# Patient Record
Sex: Female | Born: 1995
Health system: Southern US, Community
[De-identification: ages and names within clinical notes are randomized; demographics above are authoritative.]

## PROBLEM LIST (undated history)

## (undated) DIAGNOSIS — T4145XA Adverse effect of unspecified anesthetic, initial encounter: Secondary | ICD-10-CM

## (undated) DIAGNOSIS — E282 Polycystic ovarian syndrome: Secondary | ICD-10-CM

## (undated) DIAGNOSIS — N912 Amenorrhea, unspecified: Secondary | ICD-10-CM

## (undated) DIAGNOSIS — E119 Type 2 diabetes mellitus without complications: Secondary | ICD-10-CM

## (undated) DIAGNOSIS — N946 Dysmenorrhea, unspecified: Secondary | ICD-10-CM

## (undated) DIAGNOSIS — N979 Female infertility, unspecified: Secondary | ICD-10-CM

## (undated) DIAGNOSIS — H539 Unspecified visual disturbance: Secondary | ICD-10-CM

## (undated) DIAGNOSIS — T8859XA Other complications of anesthesia, initial encounter: Secondary | ICD-10-CM

## (undated) DIAGNOSIS — C801 Malignant (primary) neoplasm, unspecified: Secondary | ICD-10-CM

## (undated) DIAGNOSIS — E349 Endocrine disorder, unspecified: Secondary | ICD-10-CM

## (undated) DIAGNOSIS — J45909 Unspecified asthma, uncomplicated: Secondary | ICD-10-CM

## (undated) DIAGNOSIS — R7303 Prediabetes: Secondary | ICD-10-CM

## (undated) HISTORY — DX: Female infertility, unspecified: N97.9

## (undated) HISTORY — DX: Malignant (primary) neoplasm, unspecified: C80.1

## (undated) HISTORY — PX: ADENOIDECTOMY: SUR15

## (undated) HISTORY — PX: APPENDECTOMY: SHX54

## (undated) HISTORY — PX: CARPAL TUNNEL RELEASE: SHX101

## (undated) HISTORY — PX: CATARACT EXTRACTION, BILATERAL: SHX1313

## (undated) HISTORY — DX: Polycystic ovarian syndrome: E28.2

## (undated) HISTORY — DX: Amenorrhea, unspecified: N91.2

## (undated) HISTORY — PX: DILATION AND CURETTAGE OF UTERUS: SHX78

## (undated) HISTORY — DX: Endocrine disorder, unspecified: E34.9

## (undated) HISTORY — DX: Prediabetes: R73.03

## (undated) HISTORY — PX: TONSILLECTOMY: SUR1361

## (undated) HISTORY — DX: Dysmenorrhea, unspecified: N94.6

---
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
## 2014-01-20 ENCOUNTER — Encounter (HOSPITAL_BASED_OUTPATIENT_CLINIC_OR_DEPARTMENT_OTHER): Payer: Self-pay | Admitting: Emergency Medicine

## 2014-01-20 ENCOUNTER — Emergency Department (HOSPITAL_BASED_OUTPATIENT_CLINIC_OR_DEPARTMENT_OTHER)
Admission: EM | Admit: 2014-01-20 | Discharge: 2014-01-20 | Disposition: A | Discharge status: Home / Self Care | Payer: Medicaid Other | Attending: Emergency Medicine | Admitting: Emergency Medicine

## 2014-01-20 DIAGNOSIS — Z880 Allergy status to penicillin: Secondary | ICD-10-CM | POA: Insufficient documentation

## 2014-01-20 DIAGNOSIS — H6092 Unspecified otitis externa, left ear: Secondary | ICD-10-CM

## 2014-01-20 DIAGNOSIS — H9209 Otalgia, unspecified ear: Secondary | ICD-10-CM | POA: Insufficient documentation

## 2014-01-20 DIAGNOSIS — Z8669 Personal history of other diseases of the nervous system and sense organs: Secondary | ICD-10-CM | POA: Insufficient documentation

## 2014-01-20 DIAGNOSIS — H60509 Unspecified acute noninfective otitis externa, unspecified ear: Secondary | ICD-10-CM | POA: Insufficient documentation

## 2014-01-20 DIAGNOSIS — Z79899 Other long term (current) drug therapy: Secondary | ICD-10-CM | POA: Insufficient documentation

## 2014-01-20 MED ORDER — OFLOXACIN 0.3 % OT SOLN: 5.0000 [drp] | Freq: Two times a day (BID) | OTIC | Status: DC

## 2014-01-20 MED ORDER — ANTIPYRINE-BENZOCAINE 5.4-1.4 % OT SOLN: 3.0000 [drp] | OTIC | Status: DC | PRN

## 2014-01-20 MED ORDER — CIPROFLOXACIN-DEXAMETHASONE 0.3-0.1 % OT SUSP: 4.0000 [drp] | Freq: Two times a day (BID) | OTIC | Status: DC

## 2014-01-20 NOTE — Discharge Instructions (Signed)
Use antibiotic ear drops as directed along with auralgan for pain. Do not use Q-tips in your ear.  Otitis externa (Otitis Externa) La otitis externa es una infeccin bacteriana o por hongos en el conducto auditivo externo. Esta es el rea desde el tmpano hasta el exterior de la [place]. Tambin se llama "odo de nadador". CAUSAS  Las posibles causas de la infeccin son:  Tonye Becket en agua sucia.  Humedad que queda en el odo despus de nadar o baarse.  Lesin leve en el odo (traumatismo).  Objetos atascados en el odo (cuerpo extrao).  Cortes o raspones (abrasiones) en la parte exterior del odo. SIGNOS Y SNTOMAS  En general, el primer sntoma de infeccin es la picazn en el canal auditivo. Ms tarde, los signos y los sntomas pueden ser hinchazn y enrojecimiento del conducto auditivo, dolor de odo y supuracin de lquido de color blanco amarillento (pus). El Social research officer, government de odo puede empeorar cuando tira el lbulo de la [place]. DIAGNSTICO  Su mdico le Chartered certified accountant examen fsico. Podr tomar Truddie Coco de lquido de la oreja y Hydrographic surveyor bacterias u hongos. TRATAMIENTO  Las gotas antibiticas para los odos se administran generalmente durante 10 a 14 das. El tratamiento tambin puede incluir analgsicos o corticoides para reducir la comezn y la hinchazn. INSTRUCCIONES PARA EL CUIDADO EN EL HOGAR   Aplique gotas de antibitico en el conducto auditivo segn lo indicado por el mdico.  Tome los medicamentos solamente como se lo haya indicado el mdico.  Si tiene diabetes, siga las instrucciones adicionales de tratamiento que le haya dado el mdico.  Consulting civil engineer a todas las visitas de control como se lo haya indicado el mdico. PREVENCIN   Mantenga el odo seco. Use la punta de una toalla para absorber el agua del canal auditivo despus de nadar o del bao.  Evite rascarse o poner objetos en el interior del odo. Esto puede daar el conducto auditivo o eliminar la cera protectora que  recubre el conducto. Esto facilita el crecimiento de las bacterias y hongos.  Evite [employer], en agua contaminada, o en las piscinas mal cloradas.  Puede usar gotas para los odos hechas de alcohol y vinagre despus de nadar. Mezcle en partes iguales el vinagre blanco y el alcohol en una botella. Ponga 3 o 4 gotas en cada odo despus de nadar. SOLICITE ATENCIN MDICA SI:   Jaclynn Guarneri.  Su odo contina rojo, hinchado, le duele o supura pus despus de 3 das.  El dolor, la hinchazn o el enrojecimiento empeoran.  Sufre un dolor intenso de [place].  Tiene en la zona detrs de la oreja roja, hinchada, le duele o est sensible. ASEGRESE DE QUE:   Comprende estas instrucciones.  Controlar su afeccin.  Recibir ayuda de inmediato si no mejora o si empeora. Document Released: 05/25/2005 Document Revised: 10/09/2013 [facility] Patient Information 2015 [place], [place]. This information is not intended to replace advice given to you by your health care provider. Make sure you discuss any questions you have with your health care provider.

## 2014-01-20 NOTE — ED Provider Notes (Signed)
CSN: [id number]     Arrival date & time 01/20/14  1922 History   First MD Initiated Contact with Patient 01/20/14 1932     Chief Complaint  Patient presents with  . Otalgia     (Consider location/radiation/quality/duration/timing/severity/associated sxs/prior Treatment) HPI Comments: Patient is a 18 year old female who presents to the emergency department complaining of left ear pain x2 weeks. Patient reports her ear has been aching and feels clogged. She tried using over-the-counter ear drop which temporarily relieved her symptoms for a few days up until 3 days ago. Pain has worsened. She tried cleaning her ears with Q-tips and states the remains. States she has slightly decreased hearing in that ear. Denies fever or chills. No ear drainage.  Patient is a 18 y.o. female presenting with ear pain. The history is provided by the patient.  Otalgia Associated symptoms: no cough and no fever     History reviewed. No pertinent past medical history. Past Surgical History  Procedure Laterality Date  . Tonsillectomy    . Appendectomy    . Carpal tunnel release    . Cataract extraction, bilateral     History reviewed. No pertinent family history. History  Substance Use Topics  . Smoking status: Never Smoker   . Smokeless tobacco: Not on file  . Alcohol Use: No   OB History   Grav Para Term Preterm Abortions TAB SAB Ect Mult Living                 Review of Systems  Constitutional: Negative for fever.  HENT: Positive for ear pain.   Respiratory: Negative for cough.   Gastrointestinal: Negative.   Skin: Negative.       Allergies  Amoxicillin and Penicillins  Home Medications   Prior to Admission medications   Medication Sig Start Date End Date Taking? Authorizing Provider  antipyrine-benzocaine Toniann Fail) otic solution Place 3-4 drops into the left ear every 2 (two) hours as needed for ear pain. 01/20/14   Illene Labrador, PA-C  cetirizine (ZYRTEC) 10 MG tablet Take 10 mg by  mouth daily.    Historical Provider, MD  ciprofloxacin-dexamethasone (CIPRODEX) otic suspension Place 4 drops into the left ear 2 (two) times daily. x5 days 01/20/14   Illene Labrador, PA-C   BP 116/61  Pulse 77  Temp(Src) 97.9 F (36.6 C) (Oral)  Resp 18  Ht 5\' 3"  (1.6 m)  Wt 210 lb (95.255 kg)  BMI 37.21 kg/m2  SpO2 97%  LMP 11/06/2013 Physical Exam  Nursing note and vitals reviewed. Constitutional: She is oriented to person, place, and time. She appears well-developed and well-nourished. No distress.  HENT:  Head: Normocephalic and atraumatic.  Mouth/Throat: Oropharynx is clear and moist.  L ear canal inflamed and erythematous. L TM normal. R ear normal. No drainage from BL ears.  Eyes: Conjunctivae are normal.  Neck: Normal range of motion. Neck supple.  Cardiovascular: Normal rate, regular rhythm and normal heart sounds.   Pulmonary/Chest: Effort normal and breath sounds normal.  Musculoskeletal: Normal range of motion. She exhibits no edema.  Neurological: She is alert and oriented to person, place, and time.  Skin: Skin is warm and dry. She is not diaphoretic.  Psychiatric: She has a normal mood and affect. Her behavior is normal.    ED Course  Procedures (including critical care time) Labs Review Labs Reviewed - No data to display  Imaging Review No results found.   EKG Interpretation None      MDM  Final diagnoses:  Left otitis externa   Treat with ciprodex and auralgan. Stable for d/c. Return precautions given. Patient states understanding of treatment care plan and is agreeable.  Illene Labrador, PA-C 01/20/14 1943

## 2014-01-20 NOTE — ED Provider Notes (Signed)
History/physical exam/procedure(s) were performed by non-physician practitioner and as supervising physician I was immediately available for consultation/collaboration. I have reviewed all notes and am in agreement with care and plan.   Shaune Pollack, MD 01/20/14 [phone]

## 2014-01-20 NOTE — ED Notes (Signed)
Painful earache for two weeks. Can't hear out of left ear.

## 2014-05-28 ENCOUNTER — Encounter (HOSPITAL_COMMUNITY): Payer: Self-pay | Admitting: Emergency Medicine

## 2014-05-28 ENCOUNTER — Emergency Department (HOSPITAL_COMMUNITY)
Admission: EM | Admit: 2014-05-28 | Discharge: 2014-05-28 | Disposition: A | Discharge status: Home / Self Care | Payer: Medicaid Other | Attending: Emergency Medicine | Admitting: Emergency Medicine

## 2014-05-28 DIAGNOSIS — M62838 Other muscle spasm: Secondary | ICD-10-CM

## 2014-05-28 DIAGNOSIS — Z880 Allergy status to penicillin: Secondary | ICD-10-CM | POA: Insufficient documentation

## 2014-05-28 DIAGNOSIS — Z792 Long term (current) use of antibiotics: Secondary | ICD-10-CM | POA: Insufficient documentation

## 2014-05-28 DIAGNOSIS — Z79899 Other long term (current) drug therapy: Secondary | ICD-10-CM | POA: Insufficient documentation

## 2014-05-28 MED ORDER — METHOCARBAMOL 500 MG PO TABS
500.0000 mg | ORAL_TABLET | Freq: Once | ORAL | Status: AC
  Administered 2014-05-28: 500 mg via ORAL
  Filled 2014-05-28: qty 1

## 2014-05-28 MED ORDER — HYDROCODONE-ACETAMINOPHEN 5-325 MG PO TABS
2.0000 | ORAL_TABLET | Freq: Once | ORAL | Status: AC
  Administered 2014-05-28: 2 via ORAL
  Filled 2014-05-28: qty 2

## 2014-05-28 MED ORDER — IBUPROFEN 600 MG PO TABS: 600.0000 mg | ORAL_TABLET | Freq: Four times a day (QID) | ORAL | Status: DC | PRN

## 2014-05-28 MED ORDER — HYDROCODONE-ACETAMINOPHEN 5-325 MG PO TABS: 1.0000 | ORAL_TABLET | Freq: Four times a day (QID) | ORAL | Status: DC | PRN

## 2014-05-28 MED ORDER — IBUPROFEN 400 MG PO TABS
600.0000 mg | ORAL_TABLET | Freq: Once | ORAL | Status: AC
  Administered 2014-05-28: 600 mg via ORAL
  Filled 2014-05-28 (×2): qty 1

## 2014-05-28 MED ORDER — METHOCARBAMOL 500 MG PO TABS: 500.0000 mg | ORAL_TABLET | Freq: Two times a day (BID) | ORAL | Status: DC

## 2014-05-28 NOTE — Discharge Instructions (Signed)
Calambres y espasmos musculares  (Muscle Cramps and Spasms)  Los calambres musculares y espasmos ocurren cuando un msculo o grupos de msculos se tensan y no se tiene control sobre esta tensin (contraccin muscular involuntaria). Es un problema comn y Audiological scientist en cualquier msculo. La zona ms comn son los msculos de la pantorrilla. Tanto los [employer] espasmos son contracciones musculares involuntarias, pero tambin tienen diferencias:   Los calambres musculares son espordicos y Soil scientist. Pueden durar entre algunos segundos hasta un cuarto de [place]. Los calambres musculares son ms fuertes y duran ms que los espasmos musculares.  Los espasmos pueden o no ser dolorosos. Pueden durar algunos segundos o mucho ms. CAUSAS  No es frecuente que los calambres se deban a un trastorno subyacente grave. En muchos casos, la causa de los calambres y los espasmos es desconocida. Algunas causas frecuentes son:   Esfuerzo excesivo.   El uso excesivo del msculo por movimientos repetitivos (hacer lo mismo una y Elmon Kirschner).   Permanecer en cierta posicin durante un largo perodo de [place].   Preparacin, forma o tcnica inadecuada al realizar un deporte o [place].   Deshidratacin.   Traumatismos.   Efectos secundarios de algunos medicamentos.  Niveles anormalmente bajos de las sales e iones en la sangre (electrolitos), especialmente el potasio y el calcio. Pueden ocurrir cuando se toman pldoras para Garment/textile technologist (diurticos) o en las mujeres embarazadas.  Algunos problemas mdicos subyacentes pueden hacer que sea ms propenso a desarrollar calambres o espasmos. Estos incluyen, pero no se limitan a:   Diabetes.   Enfermedad de Parkinson.   Trastornos hormonales, tales como problemas de la tiroides.   El consumo excesivo de alcohol.   Enfermedades especficas de [employer], las articulaciones y [place].   Enfermedad vascular en la que no llega suficiente sangre  a los msculos.  INSTRUCCIONES PARA EL CUIDADO EN EL HOGAR   Mantngase bien hidratado. Beba gran cantidad de lquido para mantener la orina de tono claro o color amarillo plido.  Puede ser til Community education officer, Neurosurgeon y Media planner el msculo afectado.  Para los msculos tensos o apretados, use una toalla caliente, una almohadilla trmica o agua caliente de la ducha dirigida a la zona afectada.  Si est dolorido o siente dolor despus de un calambre o espasmo, aplique hielo en el rea afectada para Runner, broadcasting/film/video.  Ponga el hielo en una bolsa plstica.  Colquese una toalla entre la piel y la bolsa de hielo.  Deje el hielo en el lugar durante 15 a 20 minutos, 3 a 4 veces por da.  Los medicamentos que se utilizan para tratar las causas conocidas de los calambres o espasmos pueden reducir su frecuencia o gravedad. Tome slo medicamentos de venta libre o recetados, segn las indicaciones del mdico. SOLICITE ATENCIN MDICA SI:  Los calambres o espasmos empeoran, ocurren con ms frecuencia o no mejoran con Physiological scientist.  ASEGRESE DE QUE:   Comprende estas instrucciones.  Controlar su enfermedad.  Solicitar ayuda de inmediato si no mejora o si empeora. Document Released: 03/04/2005 Document Revised: 09/19/2012 [facility] Patient Information 2015 [place], [place]. This information is not intended to replace advice given to you by your health care provider. Make sure you discuss any questions you have with your health care provider.  Terapia con calor (Heat Therapy) La terapia con calor puede ayudar a aliviar articulaciones y msculos doloridos, lesionados, tensos y rgidos. El calor [employer], lo cual puede ayudar a Best boy.  RIESGOS Y  COMPLICACIONES Si tiene cualquiera de los [employer], no utilice la terapia con calor a menos que su mdico lo haya autorizado:  Mala circulacin.  Heridas que se estn curando o piel con cicatrices en la zona a  tratar.  Diabetes, enfermedades cardacas o hipertensin arterial.  Incapacidad de sentir (entumecimiento) la zona tratada.  Hinchazn inusual de la zona a tratar.  Infecciones activas.  Cogulos sanguneos.  Cncer.  Incapacidad de Scientist, forensic. Esto puede incluir nios pequeos y personas que tienen problemas con la funcin cerebral (demencia).  Embarazo. La terapia con calor solo se debe usar en lesiones viejas, preexistentes o de larga duracin (crnicas). No utilice la terapia con calor en lesiones nuevas a menos que el mdico se lo indique. CMO USAR LA TERAPIA CON CALOR Existen varios tipos distintos de terapia con calor, como:  Compresas hmedas calientes.  Bao de agua caliente.  Bolsa de agua caliente.  Almohadilla trmica.  Bolsa de gel caliente.  Vendaje caliente.  Almohadilla trmica. Utilice el mtodo de terapia con calor que le sugiera su mdico. Siga las indicaciones del mdico sobre cmo y cundo usar la terapia con Freight forwarder. RECOMENDACIONES GENERALES PARA LA TERAPIA CON CALOR  No duerma mientras [place] la terapia con calor. Utilice la terapia con calor solo mientras est despierto.  La piel puede volverse rosada mientras [place] la terapia con calor. No use la terapia con calor si la piel se pone roja.  No use la terapia con calor si siente un dolor nuevo.  Una temperatura muy alta o una exposicin prolongada al calor puede causar quemaduras. Sea cauto con la terapia de calor para evitar quemar la piel.  No use la terapia con calor en zonas de la piel que ya estn irritadas, como con una erupcin o una quemadura de sol. SOLICITE ATENCIN MDICA SI:  Observa ampollas, enrojecimiento, hinchazn o adormecimiento.  Siente un dolor nuevo.  El dolor [place]. ASEGRESE DE QUE:  Comprende estas instrucciones.  Controlar su afeccin.  Recibir ayuda de inmediato si no mejora o si empeora. Document Released: 08/17/2011 Document Revised:  10/09/2013 [facility] Patient Information 2015 [place]. This information is not intended to replace advice given to you by your health care provider. Make sure you discuss any questions you have with your health care provider.

## 2014-05-28 NOTE — ED Provider Notes (Signed)
CSN: [id number]     Arrival date & time 05/28/14  0321 History  This chart was scribed for Varney Biles, MD by Rayfield Citizen, ED Scribe. This patient was seen in room D30C/D30C and the patient's care was started at 3:44 AM.    Chief Complaint  Patient presents with  . Neck Pain   Patient is a 18 y.o. female presenting with neck pain. The history is provided by the patient. No language interpreter was used.  Neck Pain Associated symptoms: no numbness and no weakness      HPI Comments: Melissa Riggs is an otherwise healthy 18 y.o. female who presents to the Emergency Department complaining of 1 day of worsening left-sided neck pain. She reports that she woke up with this pain; she denies recent trauma or injury. Her pain is worse with movement/twisting, turning to the left worse than turning to the right. She attempted to treat her pain with icy hot patches and rubbing alcohol; there was no improvement. She denies any numbness, weakness, paresthesia or pain in her arms at this time.   History reviewed. No pertinent past medical history. Past Surgical History  Procedure Laterality Date  . Tonsillectomy    . Appendectomy    . Carpal tunnel release    . Cataract extraction, bilateral     History reviewed. No pertinent family history. History  Substance Use Topics  . Smoking status: Never Smoker   . Smokeless tobacco: Not on file  . Alcohol Use: No   OB History    No data available     Review of Systems  Constitutional: Positive for activity change.  Musculoskeletal: Positive for myalgias and neck pain.  Neurological: Negative for weakness and numbness.   Allergies  Amoxicillin and Penicillins  Home Medications   Prior to Admission medications   Medication Sig Start Date End Date Taking? Authorizing Provider  antipyrine-benzocaine Toniann Fail) otic solution Place 3-4 drops into the left ear every 2 (two) hours as needed for ear pain. 01/20/14   Carman Ching, PA-C  cetirizine  (ZYRTEC) 10 MG tablet Take 10 mg by mouth daily.    Historical Provider, MD  ciprofloxacin-dexamethasone (CIPRODEX) otic suspension Place 4 drops into the left ear 2 (two) times daily. x5 days 01/20/14   Carman Ching, PA-C  HYDROcodone-acetaminophen (NORCO/VICODIN) 5-325 MG per tablet Take 1 tablet by mouth every 6 (six) hours as needed. 05/28/14   Varney Biles, MD  ibuprofen (ADVIL,MOTRIN) 600 MG tablet Take 1 tablet (600 mg total) by mouth every 6 (six) hours as needed. 05/28/14   Varney Biles, MD  methocarbamol (ROBAXIN) 500 MG tablet Take 1 tablet (500 mg total) by mouth 2 (two) times daily. 05/28/14   Varney Biles, MD  ofloxacin (FLOXIN) 0.3 % otic solution Place 5 drops into the left ear 2 (two) times daily. x5 days 01/20/14   Hessie Diener Hess, PA-C   BP 140/89 mmHg  Pulse 82  Temp(Src) 97.8 F (36.6 C) (Oral)  Resp 16  Ht 5\' 4"  (1.626 m)  Wt 215 lb (97.523 kg)  BMI 36.89 kg/m2  SpO2 97% Physical Exam  Constitutional: She is oriented to person, place, and time. She appears well-developed and well-nourished.  HENT:  Head: Normocephalic and atraumatic.  Mouth/Throat: Oropharynx is clear and moist. No oropharyngeal exudate.  Eyes: EOM are normal. Pupils are equal, round, and reactive to light.  Cardiovascular: Normal rate, regular rhythm and normal heart sounds.  Exam reveals no gallop and no friction rub.  No murmur heard. Pulmonary/Chest: Effort normal and breath sounds normal. No respiratory distress. She has no wheezes. She has no rales.  Abdominal: Soft. There is no tenderness. There is no rebound and no guarding.  Musculoskeletal: Normal range of motion. She exhibits no edema.  Left cervical paraspinal tenderness extending to scapular region  Tenderness reproduced with palpation and lateral flexion of the neck; worse on the left side  Neurological: She is alert and oriented to person, place, and time.  Skin: Skin is warm and dry. No rash noted.  Psychiatric: She has a normal  mood and affect. Her behavior is normal.  Nursing note and vitals reviewed.   ED Course  Procedures   DIAGNOSTIC STUDIES: Oxygen Saturation is 97% on RA, adequate by my interpretation.    COORDINATION OF CARE: 3:48 AM Discussed treatment plan with pt at bedside and pt agreed to plan.   Labs Review Labs Reviewed - No data to display  Imaging Review No results found.   EKG Interpretation None      MDM   Final diagnoses:  Cervical paraspinal muscle spasm   I personally performed the services described in this documentation, which was scribed in my presence. The recorded information has been reviewed and is accurate.  Pt comes in with cc of L neck pain. Pt's pain is paraspinal, reproducible with palpation and with movement of the neck. She has no fevers, headache, trauma hx or neuro complains (vision/balance, numbness). Will tx as cervical sprain.     Varney Biles, MD 05/28/14 [phone]

## 2014-05-28 NOTE — ED Notes (Signed)
Patient with left neck pain.  Patient states that it is the left side of her neck.  Patient denies any headache with the pain.  Patient states she has more pain when turning her head to the left than right.  Patient denies any injury to the area.

## 2014-09-20 ENCOUNTER — Emergency Department (HOSPITAL_BASED_OUTPATIENT_CLINIC_OR_DEPARTMENT_OTHER)
Admission: EM | Admit: 2014-09-20 | Discharge: 2014-09-20 | Disposition: A | Discharge status: Home / Self Care | Payer: Medicaid Other | Attending: Emergency Medicine | Admitting: Emergency Medicine

## 2014-09-20 ENCOUNTER — Encounter (HOSPITAL_BASED_OUTPATIENT_CLINIC_OR_DEPARTMENT_OTHER): Payer: Self-pay

## 2014-09-20 DIAGNOSIS — L03116 Cellulitis of left lower limb: Secondary | ICD-10-CM | POA: Insufficient documentation

## 2014-09-20 DIAGNOSIS — Z880 Allergy status to penicillin: Secondary | ICD-10-CM | POA: Insufficient documentation

## 2014-09-20 DIAGNOSIS — Z79899 Other long term (current) drug therapy: Secondary | ICD-10-CM | POA: Insufficient documentation

## 2014-09-20 MED ORDER — CLINDAMYCIN HCL 150 MG PO CAPS
450.0000 mg | ORAL_CAPSULE | Freq: Four times a day (QID) | ORAL | Status: DC
Start: 2014-09-20 — End: 2015-06-02

## 2014-09-20 MED ORDER — KETOROLAC TROMETHAMINE 60 MG/2ML IM SOLN
60.0000 mg | Freq: Once | INTRAMUSCULAR | Status: AC
  Administered 2014-09-20: 60 mg via INTRAMUSCULAR
  Filled 2014-09-20: qty 2

## 2014-09-20 NOTE — Discharge Instructions (Signed)
Return to the emergency room with worsening of symptoms, new symptoms or with symptoms that are concerning, especially fevers, worsening redness, swelling, red streaks. Please take all of your antibiotics until finished!   You may develop abdominal discomfort or diarrhea from the antibiotic.  You may help offset this with probiotics which you can buy or get in yogurt. Do not eat  or take the probiotics until 2 hours after your antibiotic.  Follow up here/PCP/ urgent care in 2-3 days for reevauluation Read below information and follow recommendations.  Celulitis (Cellulitis) La celulitis es una infeccin de la piel y del tejido subyacente. El rea infectada generalmente est de color rojo y duele. Ocurre con ms frecuencia en los brazos y en las piernas.  CAUSAS  La causa es una bacteria que ingresa en la piel a travs de grietas o cortes. Los tipos ms frecuentes de bacterias que causan celulitis son los estafilococos y los estreptococos. SIGNOS Y SNTOMAS   Enrojecimiento y calor.  Hinchazn.  Sensibilidad o dolor.  Cristy Hilts. DIAGNSTICO  Por lo general, el mdico puede diagnosticar esta afeccin con un examen fsico. Es posible que le indiquen anlisis de [place]. TRATAMIENTO  El tratamiento consiste en tomar antibiticos. Vazquez los antibiticos como le indic el mdico. Termine los antibiticos aunque comience a sentirse mejor.  Mantenga el brazo o la pierna elevados para reducir la hinchazn.  Aplique paos calientes en la zona hasta 4 veces al da para Glass blower/designer.  Tome los medicamentos solamente como se lo haya indicado el mdico.  Concurra a todas las visitas de control como se lo haya indicado el mdico. SOLICITE ATENCIN MDICA SI:   Margette Fast lnea roja en la piel que sale desde la zona infectada.  El rea roja se extiende o se vuelve de color oscuro.  El hueso o la articulacin que se encuentran por debajo de la zona  infectada le duelen despus de que la piel se [place].  La infeccin se repite en la misma zona o en una zona diferente.  Tiene un bulto inflamado en la zona infectada.  Presenta nuevos sntomas.  Tiene fiebre. SOLICITE ATENCIN MDICA DE INMEDIATO SI:   Se siente muy somnoliento.  Tiene vmitos o diarrea.  Se siente enfermo (malestar general) con dolores musculares. ASEGRESE DE QUE:   Comprende estas instrucciones.  Controlar su afeccin.  Recibir ayuda de inmediato si no mejora o si empeora. Document Released: 03/04/2005 Document Revised: 10/09/2013 [facility] Patient Information 2015 [place]. This information is not intended to replace advice given to you by your health care provider. Make sure you discuss any questions you have with your health care provider.

## 2014-09-20 NOTE — ED Notes (Signed)
Reports increased swelling, redness and pain to left leg

## 2014-09-20 NOTE — ED Provider Notes (Signed)
CSN: [id number]     Arrival date & time 09/20/14  1840 History   First MD Initiated Contact with Patient 09/20/14 1850     Chief Complaint  Patient presents with  . Leg Pain     (Consider location/radiation/quality/duration/timing/severity/associated sxs/prior Treatment) HPI  Melissa Riggs is a 19 y.o. female presenting with one week of erythema, swelling, pain to left medial leg proximal to knee. Patient reports it has been spreading. She denies any discharge or drainage. No fevers or chills, nausea, vomiting. She denies history of diabetes, HIV or chronic illness. Patient states the pain is worse with straightening her leg. She has been taking ibuprofen with some improvement of her symptoms.   History reviewed. No pertinent past medical history. Past Surgical History  Procedure Laterality Date  . Tonsillectomy    . Appendectomy    . Carpal tunnel release    . Cataract extraction, bilateral     No family history on file. History  Substance Use Topics  . Smoking status: Never Smoker   . Smokeless tobacco: Not on file  . Alcohol Use: No   OB History    No data available     Review of Systems  Constitutional: Negative for fever and chills.  Gastrointestinal: Negative for nausea and vomiting.  Skin: Positive for color change. Negative for wound.      Allergies  Amoxicillin and Penicillins  Home Medications   Prior to Admission medications   Medication Sig Start Date End Date Taking? Authorizing Provider  antipyrine-benzocaine Toniann Fail) otic solution Place 3-4 drops into the left ear every 2 (two) hours as needed for ear pain. 01/20/14   Carman Ching, PA-C  cetirizine (ZYRTEC) 10 MG tablet Take 10 mg by mouth daily.    Historical Provider, MD  ciprofloxacin-dexamethasone (CIPRODEX) otic suspension Place 4 drops into the left ear 2 (two) times daily. x5 days 01/20/14   Carman Ching, PA-C  clindamycin (CLEOCIN) 150 MG capsule Take 3 capsules (450 mg total) by mouth 4  (four) times daily. 09/20/14   Al Corpus, PA-C  HYDROcodone-acetaminophen (NORCO/VICODIN) 5-325 MG per tablet Take 1 tablet by mouth every 6 (six) hours as needed. 05/28/14   Varney Biles, MD  ibuprofen (ADVIL,MOTRIN) 600 MG tablet Take 1 tablet (600 mg total) by mouth every 6 (six) hours as needed. 05/28/14   Varney Biles, MD  methocarbamol (ROBAXIN) 500 MG tablet Take 1 tablet (500 mg total) by mouth 2 (two) times daily. 05/28/14   Varney Biles, MD  ofloxacin (FLOXIN) 0.3 % otic solution Place 5 drops into the left ear 2 (two) times daily. x5 days 01/20/14   Carman Ching, PA-C   BP 137/85 mmHg  Pulse 107  Temp(Src) 98.3 F (36.8 C) (Oral)  Resp 20  Ht 5\' 3"  (1.6 m)  Wt 230 lb (104.327 kg)  BMI 40.75 kg/m2  SpO2 97% Physical Exam  Constitutional: She appears well-developed and well-nourished. No distress.  HENT:  Head: Normocephalic and atraumatic.  Eyes: Conjunctivae are normal. Right eye exhibits no discharge. Left eye exhibits no discharge.  Cardiovascular:  2+ radial pulses equal bilaterally.  Pulmonary/Chest: Effort normal. No respiratory distress.  Musculoskeletal:  tenderness, swelling, erythema.  Neurological: She is alert. Coordination normal.  Strength equal and intact in lower extremities. Sensation intact  Skin: She is not diaphoretic.  Cellulitis to left lower extremity from mid thigh to knee that is not circumferential and tender to palpation. No red streaks from the area.  Psychiatric: She has  a normal mood and affect. Her behavior is normal.  Nursing note and vitals reviewed.   ED Course  Procedures (including critical care time) Labs Review Labs Reviewed - No data to display  Imaging Review No results found.   EKG Interpretation None      MDM   Final diagnoses:  Cellulitis of left lower extremity   Patient with cellulitis to left lower extremity with no area of fluctuance. No red streaks. Patient without immunosuppression. Area has been  marked and she is to follow-up here or with PCP or urgent care in 2-3 days for reevaluation. We'll treat with clindamycin.  Discussed return precautions with patient. Discussed all results and patient verbalizes understanding and agrees with plan.   Al Corpus, PA-C 09/20/14 [place], MD 09/22/14 2006

## 2015-06-02 ENCOUNTER — Encounter (HOSPITAL_COMMUNITY): Payer: Self-pay | Admitting: Vascular Surgery

## 2015-06-02 ENCOUNTER — Emergency Department (HOSPITAL_COMMUNITY)
Admission: EM | Admit: 2015-06-02 | Discharge: 2015-06-02 | Disposition: A | Discharge status: Home / Self Care | Payer: Medicaid Other | Attending: Emergency Medicine | Admitting: Emergency Medicine

## 2015-06-02 DIAGNOSIS — Z79899 Other long term (current) drug therapy: Secondary | ICD-10-CM | POA: Insufficient documentation

## 2015-06-02 DIAGNOSIS — L03114 Cellulitis of left upper limb: Secondary | ICD-10-CM | POA: Insufficient documentation

## 2015-06-02 DIAGNOSIS — Z880 Allergy status to penicillin: Secondary | ICD-10-CM | POA: Insufficient documentation

## 2015-06-02 DIAGNOSIS — L03818 Cellulitis of other sites: Secondary | ICD-10-CM

## 2015-06-02 MED ORDER — CLINDAMYCIN HCL 150 MG PO CAPS
300.0000 mg | ORAL_CAPSULE | Freq: Once | ORAL | Status: AC
  Administered 2015-06-02: 300 mg via ORAL
  Filled 2015-06-02: qty 2

## 2015-06-02 MED ORDER — CLINDAMYCIN HCL 150 MG PO CAPS: 300.0000 mg | ORAL_CAPSULE | Freq: Four times a day (QID) | ORAL | Status: DC

## 2015-06-02 NOTE — Discharge Instructions (Signed)
Take ibuprofen or Tylenol for pain. Make sure to take antibiotic as prescribed until all gone. Follow-up in 2 days for recheck. Return sooner if worsening quickly.  Cellulitis Cellulitis is an infection of the skin and the tissue beneath it. The infected area is usually red and tender. Cellulitis occurs most often in the arms and lower legs.  CAUSES  Cellulitis is caused by bacteria that enter the skin through cracks or cuts in the skin. The most common types of bacteria that cause cellulitis are staphylococci and streptococci. SIGNS AND SYMPTOMS   Redness and warmth.  Swelling.  Tenderness or pain.  Fever. DIAGNOSIS  Your health care provider can usually determine what is wrong based on a physical exam. Blood tests may also be done. TREATMENT  Treatment usually involves taking an antibiotic medicine. HOME CARE INSTRUCTIONS   Take your antibiotic medicine as directed by your health care provider. Finish the antibiotic even if you start to feel better.  Keep the infected arm or leg elevated to reduce swelling.  Apply a warm cloth to the affected area up to 4 times per day to relieve pain.  Take medicines only as directed by your health care provider.  Keep all follow-up visits as directed by your health care provider. SEEK MEDICAL CARE IF:   You notice red streaks coming from the infected area.  Your red area gets larger or turns dark in color.  Your bone or joint underneath the infected area becomes painful after the skin has healed.  Your infection returns in the same area or another area.  You notice a swollen bump in the infected area.  You develop new symptoms.  You have a fever. SEEK IMMEDIATE MEDICAL CARE IF:   You feel very sleepy.  You develop vomiting or diarrhea.  You have a general ill feeling (malaise) with muscle aches and pains.   This information is not intended to replace advice given to you by your health care provider. Make sure you discuss any  questions you have with your health care provider.   Document Released: 03/04/2005 Document Revised: 02/13/2015 Document Reviewed: 08/10/2011 Elsevier Interactive Patient Education [employer].

## 2015-06-02 NOTE — ED Notes (Signed)
Pt reports to the ED for eval of rash to left shoulder. Pt reports on Friday she first noticed it but it has been growing in size ever since. Area of erythema with scaling in the middle. Pt has been using triamcinolone cream without relief. She reports it is both itchy and painful. Pt denies any SOB or facial swelling. Rash is contained to the left shoulder area. Pt A&Ox4, resp e/u, and skin warm and dry.

## 2015-06-02 NOTE — ED Provider Notes (Signed)
CSN: [id number]     Arrival date & time 06/02/15  2152 History  By signing my name below, I, Helane Gunther, attest that this documentation has been prepared under the direction and in the presence of [employer], PA-C. Electronically Signed: Helane Gunther, ED Scribe. 06/02/2015. 11:22 PM.    Chief Complaint  Patient presents with  . Rash   The history is provided by the patient. No language interpreter was used.   HPI Comments: Melissa Riggs is a 19 y.o. female who presents to the Emergency Department complaining of a spreading, worsening, itchy, painful, erythematous rash to the left shoulder onset 2 days ago. Pt states the rash began as a small area of erythema. She reports associated increased skin sensitivity to the area, which exacerbates the pain of the rash. She states she has tried using triamcinolone cream with no relief for the past two days. She notes she has marked the edges of the rash, and has been careful to wear her bra strap to the side of it. She denies a PMHx of cellulitis, but notes a PMHx of an abscess to the back of her leg. She notes she has had chicken pox as a child. Pt denies n/v and fever.  Pt is allergic to amoxicillin and penicillins.  History reviewed. No pertinent past medical history. Past Surgical History  Procedure Laterality Date  . Tonsillectomy    . Appendectomy    . Carpal tunnel release    . Cataract extraction, bilateral     No family history on file. Social History  Substance Use Topics  . Smoking status: Never Smoker   . Smokeless tobacco: None  . Alcohol Use: No   OB History    No data available     Review of Systems  Constitutional: Negative for fever.  Gastrointestinal: Negative for nausea and vomiting.  Skin: Positive for rash.    Allergies  Amoxicillin and Penicillins  Home Medications   Prior to Admission medications   Medication Sig Start Date End Date Taking? Authorizing Provider  antipyrine-benzocaine  Toniann Fail) otic solution Place 3-4 drops into the left ear every 2 (two) hours as needed for ear pain. 01/20/14   Carman Ching, PA-C  cetirizine (ZYRTEC) 10 MG tablet Take 10 mg by mouth daily.    Historical Provider, MD  ciprofloxacin-dexamethasone (CIPRODEX) otic suspension Place 4 drops into the left ear 2 (two) times daily. x5 days 01/20/14   Carman Ching, PA-C  clindamycin (CLEOCIN) 150 MG capsule Take 3 capsules (450 mg total) by mouth 4 (four) times daily. 09/20/14   Al Corpus, PA-C  HYDROcodone-acetaminophen (NORCO/VICODIN) 5-325 MG per tablet Take 1 tablet by mouth every 6 (six) hours as needed. 05/28/14   Varney Biles, MD  ibuprofen (ADVIL,MOTRIN) 600 MG tablet Take 1 tablet (600 mg total) by mouth every 6 (six) hours as needed. 05/28/14   Varney Biles, MD  methocarbamol (ROBAXIN) 500 MG tablet Take 1 tablet (500 mg total) by mouth 2 (two) times daily. 05/28/14   Varney Biles, MD  ofloxacin (FLOXIN) 0.3 % otic solution Place 5 drops into the left ear 2 (two) times daily. x5 days 01/20/14   Hessie Diener Hess, PA-C   BP 134/83 mmHg  Pulse 74  Temp(Src) 98.4 F (36.9 C) (Oral)  Resp 14  SpO2 98% Physical Exam  Constitutional: She is oriented to person, place, and time. She appears well-developed and well-nourished.  HENT:  Head: Normocephalic and atraumatic.  Eyes: Conjunctivae are normal. Right eye  exhibits no discharge. Left eye exhibits no discharge.  Pulmonary/Chest: Effort normal. No respiratory distress.  Neurological: She is alert and oriented to person, place, and time. Coordination normal.  Skin: Skin is warm and dry. She is not diaphoretic.  12 x 8 cm area of erythema to the left shoulder, tender to palpation, tiny papules over the rash.  Psychiatric: She has a normal mood and affect.  Nursing note and vitals reviewed.   ED Course  Procedures  DIAGNOSTIC STUDIES: Oxygen Saturation is 98% on RA, normal by my interpretation.    COORDINATION OF CARE: 11:20 PM -  Discussed probable skin infection, possibly schingles. Discussed plans to review possibilities. Pt advised of plan for treatment and pt agrees.  Labs Review Labs Reviewed - No data to display  Imaging Review No results found. I have personally reviewed and evaluated these images and lab results as part of my medical decision-making.   EKG Interpretation None      MDM   Final diagnoses:  Cellulitis of other specified site    patient with painful rash to the left shoulder. Concerning for cellulitis/erysipelas. Discussed with Dr. Dina Rich who has seen patient as well. Will start on antibiotics, clindamycin. Follow-up in 2 days for recheck. At this time patient is afebrile, normal vital signs patient is nontoxic appearing. Also considered possible shingles, however across a dermatome. Patient is comfortable going home. Return precautions discussed.   Filed Vitals:   06/02/15 2203  BP: 134/83  Pulse: 74  Temp: 98.4 F (36.9 C)  TempSrc: Oral  Resp: 14  SpO2: 98%    I personally performed the services described in this documentation, which was scribed in my presence. The recorded information has been reviewed and is accurate.     Jeannett Senior, PA-C 06/02/15 2337  Merryl Hacker, MD 06/11/15 0005

## 2015-06-02 NOTE — ED Notes (Signed)
[place], MD at bedside.  

## 2015-06-04 ENCOUNTER — Encounter (HOSPITAL_COMMUNITY): Payer: Self-pay | Admitting: *Deleted

## 2015-06-04 ENCOUNTER — Emergency Department (HOSPITAL_COMMUNITY)
Admission: EM | Admit: 2015-06-04 | Discharge: 2015-06-05 | Disposition: A | Discharge status: Home / Self Care | Payer: Medicaid Other | Attending: Emergency Medicine | Admitting: Emergency Medicine

## 2015-06-04 DIAGNOSIS — R197 Diarrhea, unspecified: Secondary | ICD-10-CM | POA: Insufficient documentation

## 2015-06-04 DIAGNOSIS — R21 Rash and other nonspecific skin eruption: Secondary | ICD-10-CM | POA: Insufficient documentation

## 2015-06-04 DIAGNOSIS — Z880 Allergy status to penicillin: Secondary | ICD-10-CM | POA: Insufficient documentation

## 2015-06-04 DIAGNOSIS — Z79899 Other long term (current) drug therapy: Secondary | ICD-10-CM | POA: Insufficient documentation

## 2015-06-04 LAB — CBG MONITORING, ED: GLUCOSE-CAPILLARY: 76 mg/dL (ref 65–99)

## 2015-06-04 MED ORDER — HYDROCORTISONE 1 % EX CREA: TOPICAL_CREAM | CUTANEOUS | Status: DC

## 2015-06-04 MED ORDER — METHYLPREDNISOLONE SODIUM SUCC 125 MG IJ SOLR
125.0000 mg | Freq: Once | INTRAMUSCULAR | Status: AC
  Administered 2015-06-04: 125 mg via INTRAMUSCULAR
  Filled 2015-06-04: qty 2

## 2015-06-04 MED ORDER — PREDNISONE 20 MG PO TABS: 40.0000 mg | ORAL_TABLET | Freq: Every day | ORAL | Status: DC

## 2015-06-04 MED ORDER — DIPHENHYDRAMINE HCL 25 MG PO CAPS
25.0000 mg | ORAL_CAPSULE | Freq: Once | ORAL | Status: AC
  Administered 2015-06-04: 25 mg via ORAL
  Filled 2015-06-04: qty 1

## 2015-06-04 NOTE — ED Notes (Signed)
She is talkibng on her cell while being triaged

## 2015-06-04 NOTE — Discharge Instructions (Signed)
Rash A rash is a change in the color or texture of the skin. There are many different types of rashes. You may have other problems that accompany your rash. CAUSES   Infections.  Allergic reactions. This can include allergies to pets or foods.  Certain medicines.  Exposure to certain chemicals, soaps, or cosmetics.  Heat.  Exposure to poisonous plants.  Tumors, both cancerous and noncancerous. SYMPTOMS   Redness.  Scaly skin.  Itchy skin.  Dry or cracked skin.  Bumps.  Blisters.  Pain. DIAGNOSIS  Your caregiver may do a physical exam to determine what type of rash you have. A skin sample (biopsy) may be taken and examined under a microscope. TREATMENT  Treatment depends on the type of rash you have. Your caregiver may prescribe certain medicines. For serious conditions, you may need to see a skin doctor (dermatologist). HOME CARE INSTRUCTIONS   Avoid the substance that caused your rash.  Do not scratch your rash. This can cause infection.  You may take cool baths to help stop itching.  Only take over-the-counter or prescription medicines as directed by your caregiver.  Keep all follow-up appointments as directed by your caregiver. SEEK IMMEDIATE MEDICAL CARE IF:  You have increasing pain, swelling, or redness.  You have a fever.  You have new or severe symptoms.  You have body aches, diarrhea, or vomiting.  Your rash is not better after 3 days. MAKE SURE YOU:  Understand these instructions.  Will watch your condition.  Will get help right away if you are not doing well or get worse.   This information is not intended to replace advice given to you by your health care provider. Make sure you discuss any questions you have with your health care provider.   Document Released: 05/15/2002 Document Revised: 06/15/2014 Document Reviewed: 10/10/2014 Elsevier Interactive Patient Education 2016 [place] Dermatitis Dermatitis is redness,  soreness, and swelling (inflammation) of the skin. Contact dermatitis is a reaction to certain substances that touch the skin. There are two types of contact dermatitis:   Irritant contact dermatitis. This type is caused by something that irritates your skin, such as dry hands from washing them too much. This type does not require previous exposure to the substance for a reaction to occur. This type is more common.  Allergic contact dermatitis. This type is caused by a substance that you are allergic to, such as a nickel allergy or poison ivy. This type only occurs if you have been exposed to the substance (allergen) before. Upon a repeat exposure, your body reacts to the substance. This type is less common. CAUSES  Many different substances can cause contact dermatitis. Irritant contact dermatitis is most commonly caused by exposure to:   Makeup.   Soaps.   Detergents.   Bleaches.   Acids.   Metal salts, such as nickel.  Allergic contact dermatitis is most commonly caused by exposure to:   Poisonous plants.   Chemicals.   Jewelry.   Latex.   Medicines.   Preservatives in products, such as clothing.  RISK FACTORS This condition is more likely to develop in:   People who have jobs that expose them to irritants or allergens.  People who have certain medical conditions, such as asthma or eczema.  SYMPTOMS  Symptoms of this condition may occur anywhere on your body where the irritant has touched you or is touched by you. Symptoms include:  Dryness or flaking.   Redness.   Cracks.   Itching.  Pain or a burning feeling.   Blisters.  Drainage of small amounts of blood or clear fluid from skin cracks. With allergic contact dermatitis, there may also be swelling in areas such as the eyelids, mouth, or genitals.  DIAGNOSIS  This condition is diagnosed with a medical history and physical exam. A patch skin test may be performed to help determine the  cause. If the condition is related to your job, you may need to see an occupational medicine specialist. TREATMENT Treatment for this condition includes figuring out what caused the reaction and protecting your skin from further contact. Treatment may also include:   Steroid creams or ointments. Oral steroid medicines may be needed in more severe cases.  Antibiotics or antibacterial ointments, if a skin infection is present.  Antihistamine lotion or an antihistamine taken by mouth to ease itching.  A bandage (dressing). HOME CARE INSTRUCTIONS Skin Care  Moisturize your skin as needed.   Apply cool compresses to the affected areas.  Try taking a bath with:  Epsom salts. Follow the instructions on the packaging. You can get these at your local pharmacy or grocery store.  Baking soda. Pour a small amount into the bath as directed by your health care provider.  Colloidal oatmeal. Follow the instructions on the packaging. You can get this at your local pharmacy or grocery store.  Try applying baking soda paste to your skin. Stir water into baking soda until it reaches a paste-like consistency.  Do not scratch your skin.  Bathe less frequently, such as every other day.  Bathe in lukewarm water. Avoid using hot water. Medicines  Take or apply over-the-counter and prescription medicines only as told by your health care provider.   If you were prescribed an antibiotic medicine, take or apply your antibiotic as told by your health care provider. Do not stop using the antibiotic even if your condition starts to improve. General Instructions  Keep all follow-up visits as told by your health care provider. This is important.  Avoid the substance that caused your reaction. If you do not know what caused it, keep a journal to try to track what caused it. Write down:  What you eat.  What cosmetic products you use.  What you drink.  What you wear in the affected area. This  includes jewelry.  If you were given a dressing, take care of it as told by your health care provider. This includes when to change and remove it. SEEK MEDICAL CARE IF:   Your condition does not improve with treatment.  Your condition gets worse.  You have signs of infection such as swelling, tenderness, redness, soreness, or warmth in the affected area.  You have a fever.  You have new symptoms. SEEK IMMEDIATE MEDICAL CARE IF:   You have a severe headache, neck pain, or neck stiffness.  You vomit.  You feel very sleepy.  You notice red streaks coming from the affected area.  Your bone or joint underneath the affected area becomes painful after the skin has healed.  The affected area turns darker.  You have difficulty breathing.   This information is not intended to replace advice given to you by your health care provider. Make sure you discuss any questions you have with your health care provider.   Document Released: 05/22/2000 Document Revised: 02/13/2015 Document Reviewed: 10/10/2014 Elsevier Interactive Patient Education [employer].

## 2015-06-04 NOTE — ED Provider Notes (Signed)
CSN: [id number]     Arrival date & time 06/04/15  1857 History   First MD Initiated Contact with Patient 06/04/15 2250     Chief Complaint  Patient presents with  . Rash   Melissa Riggs is a 19 y.o. female with history of obesity presents to the emergency department complaining of a rash to her left shoulder for the past 5 days. She reports she was seen in the emergency department 2 days ago and was started on clindamycin. She reports no relief with clindamycin and this has become worse. She reports her rash is painful and itchy. She reports it has been growing in size. She reports she is taking a dose of clindamycin and has had some diarrhea today. She denies any new lotions, soaps, perfumes or detergents. She denies any new plants or animals in the home. She denies any sick contacts. She denies any exposure to new metals, plans or insects. She denies fevers, abdominal pain, nausea, vomiting, coughing, wheezing, or other rashes.   (Consider location/radiation/quality/duration/timing/severity/associated sxs/prior Treatment) HPI  History reviewed. No pertinent past medical history. Past Surgical History  Procedure Laterality Date  . Tonsillectomy    . Appendectomy    . Carpal tunnel release    . Cataract extraction, bilateral     No family history on file. Social History  Substance Use Topics  . Smoking status: Never Smoker   . Smokeless tobacco: None  . Alcohol Use: No   OB History    No data available     Review of Systems  Constitutional: Negative for fever and chills.  HENT: Negative for congestion and sore throat.   Eyes: Negative for visual disturbance.  Respiratory: Negative for cough and shortness of breath.   Cardiovascular: Negative for chest pain.  Gastrointestinal: Positive for diarrhea. Negative for nausea, vomiting and abdominal pain.  Genitourinary: Negative for dysuria.  Musculoskeletal: Negative for back pain, neck pain and neck stiffness.  Skin: Positive  for color change and rash.  Allergic/Immunologic: Negative for environmental allergies, food allergies and immunocompromised state.  Neurological: Negative for headaches.      Allergies  Amoxicillin and Penicillins  Home Medications   Prior to Admission medications   Medication Sig Start Date End Date Taking? Authorizing Provider  antipyrine-benzocaine Toniann Fail) otic solution Place 3-4 drops into the left ear every 2 (two) hours as needed for ear pain. 01/20/14   Carman Ching, PA-C  cetirizine (ZYRTEC) 10 MG tablet Take 10 mg by mouth daily.    Historical Provider, MD  ciprofloxacin-dexamethasone (CIPRODEX) otic suspension Place 4 drops into the left ear 2 (two) times daily. x5 days 01/20/14   Carman Ching, PA-C  clindamycin (CLEOCIN) 150 MG capsule Take 2 capsules (300 mg total) by mouth 4 (four) times daily. 06/02/15   Tatyana Kirichenko, PA-C  HYDROcodone-acetaminophen (NORCO/VICODIN) 5-325 MG per tablet Take 1 tablet by mouth every 6 (six) hours as needed. 05/28/14   Varney Biles, MD  hydrocortisone cream 1 % Apply to affected area 2 times daily 06/04/15   Waynetta Pean, PA-C  ibuprofen (ADVIL,MOTRIN) 600 MG tablet Take 1 tablet (600 mg total) by mouth every 6 (six) hours as needed. 05/28/14   Varney Biles, MD  methocarbamol (ROBAXIN) 500 MG tablet Take 1 tablet (500 mg total) by mouth 2 (two) times daily. 05/28/14   Varney Biles, MD  ofloxacin (FLOXIN) 0.3 % otic solution Place 5 drops into the left ear 2 (two) times daily. x5 days 01/20/14   Hessie Diener  Hess, PA-C  predniSONE (DELTASONE) 20 MG tablet Take 2 tablets (40 mg total) by mouth daily. 06/04/15   Waynetta Pean, PA-C   BP 122/71 mmHg  Pulse 83  Temp(Src) 98.5 F (36.9 C)  Resp 18  Wt 119.069 kg  SpO2 98% Physical Exam  Constitutional: She appears well-developed and well-nourished. No distress.  Nontoxic appearing.  HENT:  Head: Normocephalic and atraumatic.  Eyes: Right eye exhibits no discharge. Left eye exhibits  no discharge.  Neck: Normal range of motion. Neck supple.  Cardiovascular: Normal rate, regular rhythm, normal heart sounds and intact distal pulses.   Pulmonary/Chest: Effort normal and breath sounds normal. No respiratory distress. She has no wheezes. She has no rales.  Lymphadenopathy:    She has no cervical adenopathy.  Neurological: She is alert. Coordination normal.  Skin: Skin is warm and dry. Rash noted. She is not diaphoretic. No pallor.  There is a 10 cm papulovesicular plaque that is erythematous and slightly warm to touch. It is slightly raised. It appears more like a contact dermatitis.  No discharge.   Psychiatric: She has a normal mood and affect. Her behavior is normal.  Nursing note and vitals reviewed.   ED Course  Procedures (including critical care time) Labs Review Labs Reviewed  CBG MONITORING, ED    Imaging Review No results found.    EKG Interpretation None      Filed Vitals:   06/04/15 2010 06/04/15 2257 06/04/15 2258  BP: 121/86 122/71 122/71  Pulse: 88  83  Temp: 98.5 F (36.9 C)    Resp: 18  18  Weight: 119.069 kg    SpO2: 97%  98%     MDM   Meds given in ED:  Medications  methylPREDNISolone sodium succinate (SOLU-MEDROL) 125 mg/2 mL injection 125 mg (125 mg Intramuscular Given 06/04/15 2324)  diphenhydrAMINE (BENADRYL) capsule 25 mg (25 mg Oral Given 06/04/15 2325)    New Prescriptions   HYDROCORTISONE CREAM 1 %    Apply to affected area 2 times daily   PREDNISONE (DELTASONE) 20 MG TABLET    Take 2 tablets (40 mg total) by mouth daily.    Final diagnoses:  Rash and nonspecific skin eruption   This  is a 19 y.o. female with history of obesity presents to the emergency department complaining of a rash to her left shoulder for the past 5 days. She reports she was seen in the emergency department 2 days ago and was started on clindamycin. She reports no relief with clindamycin and this has become worse. She reports her rash is painful  and itchy. She reports it has been growing in size. She reports she is taking a dose of clindamycin and has had some diarrhea today. On exam the patient is afebrile and nontoxic-appearing. She is a 10 cm papulovesicular plaque to her left shoulder. It is slightly warm to touch. It is a light red color. Does not appear to be cellulitis. It seems more consistent with a contact dermatitis. Patient's blood sugar is 77. Overall the patient with Solu-Medrol and Benadryl in the emergency department. I encouraged her to use Benadryl as needed at home for itching. We'll discharge the patient protrusions for prednisone and hydrocortisone cream. I advised if her symptoms persist or worsen in the next 48 hours needs to follow-up with her primary care provider, with a dermatologist or needs to return the emergency department. I advised the patient to follow-up with their primary care provider this week. I advised the patient  to return to the emergency department with new or worsening symptoms or new concerns. The patient verbalized understanding and agreement with plan.   This patient was discussed with and evaluated by Dr. Lita Mains who agrees with assessment and plan.     Waynetta Pean, PA-C 06/04/15 2329  Julianne Rice, MD 06/08/15 1539

## 2015-06-04 NOTE — ED Notes (Signed)
Patch of rash lt anterior shoulder.  She reports that she was seen here 2 days ago.  But she is feeling worse.  lmp  irregular

## 2015-06-11 ENCOUNTER — Emergency Department (HOSPITAL_COMMUNITY): Payer: Medicaid Other

## 2015-06-11 ENCOUNTER — Encounter (HOSPITAL_COMMUNITY): Payer: Self-pay | Admitting: Emergency Medicine

## 2015-06-11 DIAGNOSIS — R040 Epistaxis: Secondary | ICD-10-CM | POA: Insufficient documentation

## 2015-06-11 DIAGNOSIS — J069 Acute upper respiratory infection, unspecified: Secondary | ICD-10-CM | POA: Insufficient documentation

## 2015-06-11 DIAGNOSIS — Z880 Allergy status to penicillin: Secondary | ICD-10-CM | POA: Insufficient documentation

## 2015-06-11 DIAGNOSIS — H6592 Unspecified nonsuppurative otitis media, left ear: Secondary | ICD-10-CM | POA: Insufficient documentation

## 2015-06-11 NOTE — ED Notes (Signed)
Pt states for the last couple of days she has been having a non productive cough and on and off chills. Pt states about 1 hr ago her nose started bleeding. Pt states she feels weak.

## 2015-06-12 ENCOUNTER — Emergency Department (HOSPITAL_COMMUNITY)
Admission: EM | Admit: 2015-06-12 | Discharge: 2015-06-12 | Disposition: A | Discharge status: Home / Self Care | Payer: Medicaid Other | Attending: Emergency Medicine | Admitting: Emergency Medicine

## 2015-06-12 DIAGNOSIS — H6692 Otitis media, unspecified, left ear: Secondary | ICD-10-CM

## 2015-06-12 DIAGNOSIS — J069 Acute upper respiratory infection, unspecified: Secondary | ICD-10-CM

## 2015-06-12 MED ORDER — CEFDINIR 300 MG PO CAPS: 600.0000 mg | ORAL_CAPSULE | Freq: Every day | ORAL | Status: DC

## 2015-06-12 MED ORDER — BENZONATATE 100 MG PO CAPS: 100.0000 mg | ORAL_CAPSULE | Freq: Three times a day (TID) | ORAL | Status: DC

## 2015-06-12 NOTE — ED Notes (Signed)
Pt verbalized understanding of d/c instructions, prescriptions, and follow-up care. No further questions/concerns, VSS, ambulatory w/ steady gait (refused wheelchair) 

## 2015-06-12 NOTE — ED Provider Notes (Signed)
CSN: [id number]     Arrival date & time 06/11/15  2238 History  By signing my name below, I, Soijett Blue, attest that this documentation has been prepared under the direction and in the presence of Deno Etienne, DO. Electronically Signed: Soijett Blue, ED Scribe. 06/12/2015. 3:15 AM.   Chief Complaint  Patient presents with  . Cough  . Epistaxis      The history is provided by the patient. No language interpreter was used.    Melissa Riggs is a 20 y.o. female who presents to the Emergency Department complaining of non-productive cough onset 6 days worsening 3 days ago. She states that she is having associated symptoms of intermittent chills, epistaxis x 1 hour ago when the pt bent over, nasal congestion, and abdominal pain due to cough. She states that she has tried robitussin with vicks vap-o-rub with no relief for her symptoms. She denies any other symptoms. Pt notes that she has positive sick contacts of her husband, whom has resolved cold like symptoms at this time.    History reviewed. No pertinent past medical history. Past Surgical History  Procedure Laterality Date  . Tonsillectomy    . Appendectomy    . Carpal tunnel release    . Cataract extraction, bilateral     No family history on file. Social History  Substance Use Topics  . Smoking status: Never Smoker   . Smokeless tobacco: None  . Alcohol Use: No   OB History    No data available     Review of Systems  Constitutional: Positive for chills. Negative for fever.  HENT: Positive for congestion and nosebleeds. Negative for rhinorrhea.   Eyes: Negative for redness and visual disturbance.  Respiratory: Negative for shortness of breath and wheezing.   Cardiovascular: Negative for chest pain and palpitations.  Gastrointestinal: Positive for abdominal pain (due to cough). Negative for nausea and vomiting.  Genitourinary: Negative for dysuria and urgency.  Musculoskeletal: Negative for myalgias and arthralgias.  Skin:  Negative for pallor and wound.  Neurological: Negative for dizziness and headaches.      Allergies  Amoxicillin and Penicillins  Home Medications   Prior to Admission medications   Medication Sig Start Date End Date Taking? Authorizing Provider  antipyrine-benzocaine Toniann Fail) otic solution Place 3-4 drops into the left ear every 2 (two) hours as needed for ear pain. Patient not taking: Reported on 06/12/2015 01/20/14   Carman Ching, PA-C  ciprofloxacin-dexamethasone [facility]) otic suspension Place 4 drops into the left ear 2 (two) times daily. x5 days Patient not taking: Reported on 06/12/2015 01/20/14   Hessie Diener Hess, PA-C  clindamycin (CLEOCIN) 150 MG capsule Take 2 capsules (300 mg total) by mouth 4 (four) times daily. Patient not taking: Reported on 06/12/2015 06/02/15   Jeannett Senior, PA-C  HYDROcodone-acetaminophen (NORCO/VICODIN) 5-325 MG per tablet Take 1 tablet by mouth every 6 (six) hours as needed. Patient not taking: Reported on 06/12/2015 05/28/14   Varney Biles, MD  hydrocortisone cream 1 % Apply to affected area 2 times daily Patient not taking: Reported on 06/12/2015 06/04/15   Waynetta Pean, PA-C  ibuprofen (ADVIL,MOTRIN) 600 MG tablet Take 1 tablet (600 mg total) by mouth every 6 (six) hours as needed. Patient not taking: Reported on 06/12/2015 05/28/14   Varney Biles, MD  methocarbamol (ROBAXIN) 500 MG tablet Take 1 tablet (500 mg total) by mouth 2 (two) times daily. Patient not taking: Reported on 06/12/2015 05/28/14   Varney Biles, MD  ofloxacin (FLOXIN) 0.3 %  otic solution Place 5 drops into the left ear 2 (two) times daily. x5 days Patient not taking: Reported on 06/12/2015 01/20/14   Carman Ching, PA-C  predniSONE (DELTASONE) 20 MG tablet Take 2 tablets (40 mg total) by mouth daily. Patient not taking: Reported on 06/12/2015 06/04/15   Waynetta Pean, PA-C   BP 118/72 mmHg  Pulse 100  Temp(Src) 98.4 F (36.9 C) (Oral)  Resp 20  SpO2 96% Physical Exam   Constitutional: She is oriented to person, place, and time. She appears well-developed and well-nourished. No distress.  HENT:  Head: Normocephalic and atraumatic.  Right Ear: Tympanic membrane, external ear and ear canal normal.  Left Ear: Tympanic membrane is bulging. A middle ear effusion is present.  Left TM purulent effusion behind it with bulging. Swollen turbinates without signs of bleedng to either nare  Eyes: EOM are normal. Pupils are equal, round, and reactive to light.  Neck: Normal range of motion. Neck supple.  Cardiovascular: Normal rate, regular rhythm and normal heart sounds.  Exam reveals no gallop and no friction rub.   No murmur heard. Pulmonary/Chest: Effort normal and breath sounds normal. She has no wheezes. She has no rales.  Lungs clear to ausculation bilaterally.   Abdominal: Soft. She exhibits no distension. There is no tenderness.  Musculoskeletal: She exhibits no edema or tenderness.  Neurological: She is alert and oriented to person, place, and time.  Skin: Skin is warm and dry. She is not diaphoretic.  Psychiatric: She has a normal mood and affect. Her behavior is normal.  Nursing note and vitals reviewed.   ED Course  Procedures (including critical care time) DIAGNOSTIC STUDIES: Oxygen Saturation is 96% on RA, nl by my interpretation.    COORDINATION OF CARE: 3:11 AM Discussed treatment plan with pt at bedside which includes CXR and pt agreed to plan.    Labs Review Labs Reviewed - No data to display  Imaging Review Dg Chest 2 View  06/11/2015  CLINICAL DATA:  Pt states cough a 5 days, bilat rib pains from coughing, , hx asthma, non smoker, no known htn or diabetes EXAM: CHEST  2 VIEW COMPARISON:  None. FINDINGS: Normal mediastinum and cardiac silhouette. Chronic bronchitic markings centrally. Normal pulmonary vasculature. No evidence of effusion, infiltrate, or pneumothorax. No acute bony abnormality. IMPRESSION: No acute cardiopulmonary process.  Electronically Signed   By: Suzy Bouchard M.D.   On: 06/11/2015 23:29   I have personally reviewed and evaluated these images as part of my medical decision-making.   EKG Interpretation None      MDM   Final diagnoses:  URI (upper respiratory infection)  Acute left otitis media, recurrence not specified, unspecified otitis media type    20 yo F with upper respiratory infection. Patient was found to have left otitis on exam. Having some mild left ear pain will treat. Chest x-ray negative for pneumonia. DC home.    I have discussed the diagnosis/risks/treatment options with the patient and family and believe the pt to be eligible for discharge home to follow-up with PCP. We also discussed returning to the ED immediately if new or worsening sx occur. We discussed the sx which are most concerning (e.g., sudden worsening pain, fever, inability to tolerate by mouth ) that necessitate immediate return. Medications administered to the patient during their visit and any new prescriptions provided to the patient are listed below.  Medications given during this visit Medications - No data to display  Discharge Medication List as of  06/12/2015  3:17 AM    START taking these medications   Details  benzonatate (TESSALON) 100 MG capsule Take 1 capsule (100 mg total) by mouth every 8 (eight) hours., Starting 06/12/2015, Until Discontinued, Print    cefdinir (OMNICEF) 300 MG capsule Take 2 capsules (600 mg total) by mouth daily., Starting 06/12/2015, Until Discontinued, Print        The patient appears reasonably screen and/or stabilized for discharge and I doubt any other medical condition or other [facility] requiring further screening, evaluation, or treatment in the ED at this time prior to discharge.     I personally performed the services described in this documentation, which was scribed in my presence. The recorded information has been reviewed and is accurate.    Deno Etienne, DO 06/12/15 [phone]

## 2015-06-12 NOTE — Discharge Instructions (Signed)
Take tylenol 2 pills 4 times a day and motrin 4 pills 3 times a day.  Drink plenty of fluids.  Return for worsening shortness of breath, headache, confusion. Follow up with your family doctor.   Tos en los adultos (Cough, Adult) La tos ayuda a limpiar la garganta y los pulmones. La tos puede durar solo 2 o 3semanas (aguda) o ms de 8semanas (crnica). Las causas de la tos son [place]. Puede ser el signo de [place] enfermedad o de otro trastorno. CUIDADOS EN EL HOGAR  Est atento a cualquier cambio en la tos.  Tome los medicamentos solamente como se lo haya indicado el mdico.  Si le recetaron un antibitico, tmelo como se lo haya indicado el mdico. No deje de tomarlo aunque comience a sentirse mejor.  Hable con el mdico antes de probar un medicamento para la tos.  Beba suficiente lquido para mantener el pis (orina) claro o de color [place] plido.  Si el aire est seco, use un vaporizador o un humidificador con vapor fro en su casa.  Mantngase alejado de las cosas que lo hacen toser en el trabajo o en su casa.  Si la tos aumenta durante la noche, haga la prueba de usar almohadas adicionales para Theatre manager la cabeza ms elevada mientras duerme.  No fume e intente no estar cerca de humo. Si necesita ayuda para dejar de fumar, consulte al mdico.  No consuma cafena.  No beba alcohol.  Descanse todo lo que sea necesario. SOLICITE AYUDA SI:  Le aparecen problemas (sntomas) nuevos.  Expectora un lquido amarillento (pus) cuando tose.  La tos no mejora despus de 2 o 3semanas, o empeora.  Los medicamentos no [employer] tos, y no Programmer, applications.  Siente un dolor que se vuelve ms intenso o que no se [employer].  Tiene fiebre.  Est bajando de peso y no sabe por qu.  Tiene transpiracin nocturna. SOLICITE AYUDA DE INMEDIATO SI:  Tose y escupe sangre.  Tiene dificultad para respirar.  Los latidos cardacos son muy rpidos.   Esta informacin  no tiene Marine scientist el consejo del mdico. Asegrese de hacerle al mdico cualquier pregunta que tenga.   Document Released: 02/05/2011 Document Revised: 02/13/2015 Elsevier Interactive Patient Education [employer].

## 2015-06-26 ENCOUNTER — Encounter (HOSPITAL_COMMUNITY): Payer: Self-pay

## 2015-06-26 ENCOUNTER — Emergency Department (HOSPITAL_COMMUNITY)
Admission: EM | Admit: 2015-06-26 | Discharge: 2015-06-27 | Discharge status: Against Medical Advice | Payer: Medicaid Other | Attending: Emergency Medicine | Admitting: Emergency Medicine

## 2015-06-26 DIAGNOSIS — R51 Headache: Secondary | ICD-10-CM | POA: Insufficient documentation

## 2015-06-26 DIAGNOSIS — R519 Headache, unspecified: Secondary | ICD-10-CM

## 2015-06-26 MED ORDER — OXYCODONE-ACETAMINOPHEN 5-325 MG PO TABS
1.0000 | ORAL_TABLET | Freq: Once | ORAL | Status: AC
  Administered 2015-06-26: 1 via ORAL

## 2015-06-26 MED ORDER — OXYCODONE-ACETAMINOPHEN 5-325 MG PO TABS
ORAL_TABLET | ORAL | Status: DC
Start: 2015-06-26 — End: 2015-06-27
  Filled 2015-06-26: qty 1

## 2015-06-26 NOTE — ED Notes (Signed)
Pt called x2 no answer 

## 2015-06-26 NOTE — ED Provider Notes (Signed)
I signed up for this patient but they were unable to find her in the waiting room x 2 to be seen. I never saw this patient as she left before being seen but after triage.  Delos Haring, PA-C 06/26/15 [place], DO 06/29/15 2340

## 2015-06-26 NOTE — ED Notes (Signed)
Pt reports headache to right parietal area of head that has been going on for 2-3 days, pain worse with light. Denies vision changes numbness/weakness.

## 2016-01-09 ENCOUNTER — Emergency Department (HOSPITAL_BASED_OUTPATIENT_CLINIC_OR_DEPARTMENT_OTHER)
Admission: EM | Admit: 2016-01-09 | Discharge: 2016-01-09 | Disposition: A | Discharge status: Home / Self Care | Payer: Medicaid Other | Attending: Physician Assistant | Admitting: Physician Assistant

## 2016-01-09 ENCOUNTER — Encounter (HOSPITAL_BASED_OUTPATIENT_CLINIC_OR_DEPARTMENT_OTHER): Payer: Self-pay | Admitting: Emergency Medicine

## 2016-01-09 DIAGNOSIS — R519 Headache, unspecified: Secondary | ICD-10-CM

## 2016-01-09 DIAGNOSIS — R51 Headache: Secondary | ICD-10-CM | POA: Insufficient documentation

## 2016-01-09 DIAGNOSIS — H53149 Visual discomfort, unspecified: Secondary | ICD-10-CM | POA: Insufficient documentation

## 2016-01-09 MED ORDER — KETOROLAC TROMETHAMINE 30 MG/ML IJ SOLN
30.0000 mg | Freq: Once | INTRAMUSCULAR | Status: AC
  Administered 2016-01-09: 30 mg via INTRAVENOUS
  Filled 2016-01-09: qty 1

## 2016-01-09 MED ORDER — METOCLOPRAMIDE HCL 5 MG/ML IJ SOLN
10.0000 mg | Freq: Once | INTRAMUSCULAR | Status: AC
  Administered 2016-01-09: 10 mg via INTRAVENOUS
  Filled 2016-01-09: qty 2

## 2016-01-09 MED ORDER — DIPHENHYDRAMINE HCL 50 MG/ML IJ SOLN
25.0000 mg | Freq: Once | INTRAMUSCULAR | Status: AC
  Administered 2016-01-09: 25 mg via INTRAVENOUS
  Filled 2016-01-09: qty 1

## 2016-01-09 NOTE — ED Triage Notes (Addendum)
Patient reports headache x 2 days.  Reports left sided ear pain which began yesterday.  Denies history of migraines.  Denies nausea/vomiting, dizziness, vision changes.

## 2016-01-09 NOTE — ED Provider Notes (Signed)
[facility] Provider Note   CSN: [id number] Arrival date & time: 01/09/16  [id number]  First Provider Contact:  None       History   Chief Complaint Chief Complaint  Patient presents with  . Migraine    HPI Melissa Riggs is a 20 y.o. female.  Patient presents today with a headache.  Headache has been intermittent for the past 3 days.  Headache gradual in onset.  Headache located in the temple area bilaterally.  She has taken OTC pain medications with mild temporary relief.  She reports mild associated photophobia.  Denies nausea, vomiting, dizziness, vision changes, fever, chills, neck pain/stiffness, head injury, numbness, tingling, weakness, or any other symptoms.  No history of Migraine Headaches, but has had similar headaches in the past.       History reviewed. No pertinent past medical history.  There are no active problems to display for this patient.   Past Surgical History:  Procedure Laterality Date  . APPENDECTOMY    . CARPAL TUNNEL RELEASE    . CATARACT EXTRACTION, BILATERAL    . TONSILLECTOMY      OB History    No data available       Home Medications    Prior to Admission medications   Medication Sig Start Date End Date Taking? Authorizing Provider  antipyrine-benzocaine Toniann Fail) otic solution Place 3-4 drops into the left ear every 2 (two) hours as needed for ear pain. Patient not taking: Reported on 06/12/2015 01/20/14   Hessie Diener Hess, PA-C  benzonatate (TESSALON) 100 MG capsule Take 1 capsule (100 mg total) by mouth every 8 (eight) hours. 06/12/15   Deno Etienne, DO  cefdinir (OMNICEF) 300 MG capsule Take 2 capsules (600 mg total) by mouth daily. 06/12/15   Deno Etienne, DO  ciprofloxacin-dexamethasone (CIPRODEX) otic suspension Place 4 drops into the left ear 2 (two) times daily. x5 days Patient not taking: Reported on 06/12/2015 01/20/14   Hessie Diener Hess, PA-C  clindamycin (CLEOCIN) 150 MG capsule Take 2 capsules (300 mg total) by mouth 4 (four) times  daily. Patient not taking: Reported on 06/12/2015 06/02/15   Jeannett Senior, PA-C  HYDROcodone-acetaminophen (NORCO/VICODIN) 5-325 MG per tablet Take 1 tablet by mouth every 6 (six) hours as needed. Patient not taking: Reported on 06/12/2015 05/28/14   Varney Biles, MD  hydrocortisone cream 1 % Apply to affected area 2 times daily Patient not taking: Reported on 06/12/2015 06/04/15   Waynetta Pean, PA-C  ibuprofen (ADVIL,MOTRIN) 600 MG tablet Take 1 tablet (600 mg total) by mouth every 6 (six) hours as needed. Patient not taking: Reported on 06/12/2015 05/28/14   Varney Biles, MD  methocarbamol (ROBAXIN) 500 MG tablet Take 1 tablet (500 mg total) by mouth 2 (two) times daily. Patient not taking: Reported on 06/12/2015 05/28/14   Varney Biles, MD  ofloxacin (FLOXIN) 0.3 % otic solution Place 5 drops into the left ear 2 (two) times daily. x5 days Patient not taking: Reported on 06/12/2015 01/20/14   Carman Ching, PA-C  predniSONE (DELTASONE) 20 MG tablet Take 2 tablets (40 mg total) by mouth daily. Patient not taking: Reported on 06/12/2015 06/04/15   Waynetta Pean, PA-C    Family History History reviewed. No pertinent family history.  Social History Social History  Substance Use Topics  . Smoking status: Never Smoker  . Smokeless tobacco: Never Used  . Alcohol use No     Allergies   Amoxicillin and Penicillins   Review of Systems Review of  Systems  All other systems reviewed and are negative.    Physical Exam Updated Vital Signs BP 125/84 (BP Location: Left Arm)   Pulse 64   Temp 98.2 F (36.8 C) (Oral)   Resp 18   Ht 5\' 6"  (1.676 m)   Wt 117.9 kg   SpO2 99%   BMI 41.97 kg/m   Physical Exam  Constitutional: She is oriented to person, place, and time. She appears well-developed and well-nourished.  HENT:  Head: Normocephalic and atraumatic.  Right Ear: Tympanic membrane, external ear and ear canal normal.  Left Ear: Tympanic membrane, external ear and ear canal  normal.  Eyes: EOM are normal. Pupils are equal, round, and reactive to light.  Neck: Normal range of motion. Neck supple.  No nuchal rigidity  Cardiovascular: Normal rate and regular rhythm.   Pulmonary/Chest: Effort normal and breath sounds normal.  Musculoskeletal: Normal range of motion.  Neurological: She is alert and oriented to person, place, and time. She has normal strength. No cranial nerve deficit. Coordination and gait normal.  Normal finger to nose testing, no dysmetria Normal gait, no ataxia  Skin: Skin is warm and dry. No rash noted.  Psychiatric: She has a normal mood and affect.  Nursing note and vitals reviewed.    ED Treatments / Results  Labs (all labs ordered are listed, but only abnormal results are displayed) Labs Reviewed - No data to display  EKG  EKG Interpretation None       Radiology No results found.  Procedures Procedures (including critical care time)  Medications Ordered in ED Medications  ketorolac (TORADOL) 30 MG/ML injection 30 mg (not administered)  metoCLOPramide (REGLAN) injection 10 mg (not administered)  diphenhydrAMINE (BENADRYL) injection 25 mg (not administered)     Initial Impression / Assessment and Plan / ED Course  I have reviewed the triage vital signs and the nursing notes.  Pertinent labs & imaging results that were available during my care of the patient were reviewed by me and considered in my medical decision making (see chart for details).  Clinical Course    11:26 AM Reassessed patient.  She reports significant improvement in her headache.  Patient presents today with HA x 3 days.  Headache gradual in onset.  Pt HA treated and improved while in ED.  Presentation  non concerning for [facility], I[facility], Meningitis, or temporal arteritis. Pt is afebrile with no focal neuro deficits, nuchal rigidity, or change in vision. Patient given referral to Neurology.  Stable for discharge.  Return precautions given.  Patient also  evaluated by Dr Thomasene Lot who is in agreement with the plan.    Final Clinical Impressions(s) / ED Diagnoses   Final diagnoses:  None    New Prescriptions New Prescriptions   No medications on file     Hyman Bible, PA-C 01/09/16 2138    Courteney Lyn Mackuen, MD 01/11/16 [place], MD 04/11/16 1109

## 2016-09-12 ENCOUNTER — Emergency Department (HOSPITAL_COMMUNITY)
Admission: EM | Admit: 2016-09-12 | Discharge: 2016-09-13 | Disposition: A | Discharge status: Home / Self Care | Payer: Medicaid Other | Attending: Emergency Medicine | Admitting: Emergency Medicine

## 2016-09-12 ENCOUNTER — Emergency Department (HOSPITAL_COMMUNITY): Payer: Medicaid Other

## 2016-09-12 ENCOUNTER — Encounter (HOSPITAL_COMMUNITY): Payer: Self-pay

## 2016-09-12 DIAGNOSIS — R1011 Right upper quadrant pain: Secondary | ICD-10-CM | POA: Insufficient documentation

## 2016-09-12 DIAGNOSIS — R110 Nausea: Secondary | ICD-10-CM

## 2016-09-12 DIAGNOSIS — R1010 Upper abdominal pain, unspecified: Secondary | ICD-10-CM

## 2016-09-12 LAB — COMPREHENSIVE METABOLIC PANEL
ALBUMIN: 4.2 g/dL (ref 3.5–5.0)
ALK PHOS: 60 U/L (ref 38–126)
ALT: 36 U/L (ref 14–54)
AST: 28 U/L (ref 15–41)
Anion gap: 10 (ref 5–15)
BUN: 11 mg/dL (ref 6–20)
CHLORIDE: 106 mmol/L (ref 101–111)
CO2: 24 mmol/L (ref 22–32)
CREATININE: 0.84 mg/dL (ref 0.44–1.00)
Calcium: 9.6 mg/dL (ref 8.9–10.3)
GFR calc Af Amer: >60 mL/min (ref >60)
GFR calc non Af Amer: >60 mL/min (ref >60)
GLUCOSE: 110 mg/dL — AB (ref 65–99)
Potassium: 4.2 mmol/L (ref 3.5–5.1)
SODIUM: 140 mmol/L (ref 135–145)
Total Bilirubin: 0.3 mg/dL (ref 0.3–1.2)
Total Protein: 7 g/dL (ref 6.5–8.1)

## 2016-09-12 LAB — URINALYSIS, ROUTINE W REFLEX MICROSCOPIC
BILIRUBIN URINE: NEGATIVE
Glucose, UA: NEGATIVE mg/dL
Hgb urine dipstick: NEGATIVE
Ketones, ur: NEGATIVE mg/dL
Nitrite: NEGATIVE
PH: 6 (ref 5.0–8.0)
Protein, ur: NEGATIVE mg/dL
SPECIFIC GRAVITY, URINE: 1.014 (ref 1.005–1.030)

## 2016-09-12 LAB — LIPASE, BLOOD: Lipase: 27 U/L (ref 11–51)

## 2016-09-12 LAB — CBC
HCT: 44.8 % (ref 36.0–46.0)
Hemoglobin: 15.4 g/dL — ABNORMAL HIGH (ref 12.0–15.0)
MCH: 28.6 pg (ref 26.0–34.0)
MCHC: 34.4 g/dL (ref 30.0–36.0)
MCV: 83.3 fL (ref 78.0–100.0)
PLATELETS: 307 10*3/uL (ref 150–400)
RBC: 5.38 MIL/uL — ABNORMAL HIGH (ref 3.87–5.11)
RDW: 12.5 % (ref 11.5–15.5)
WBC: 11.6 10*3/uL — AB (ref 4.0–10.5)

## 2016-09-12 LAB — POC URINE PREG, ED: PREG TEST UR: NEGATIVE

## 2016-09-12 LAB — I-STAT BETA HCG BLOOD, ED (MC, WL, AP ONLY)

## 2016-09-12 MED ORDER — GI COCKTAIL ~~LOC~~
30.0000 mL | Freq: Once | ORAL | Status: AC
  Administered 2016-09-12: 30 mL via ORAL
  Filled 2016-09-12: qty 30

## 2016-09-12 MED ORDER — MORPHINE SULFATE (PF) 4 MG/ML IV SOLN
4.0000 mg | Freq: Once | INTRAVENOUS | Status: AC
  Administered 2016-09-12: 4 mg via INTRAVENOUS
  Filled 2016-09-12: qty 1

## 2016-09-12 MED ORDER — ONDANSETRON HCL 4 MG/2ML IJ SOLN
4.0000 mg | Freq: Once | INTRAMUSCULAR | Status: AC
  Administered 2016-09-12: 4 mg via INTRAVENOUS
  Filled 2016-09-12: qty 2

## 2016-09-12 NOTE — ED Provider Notes (Signed)
[facility] DEPT Provider Note   CSN: [id number] Arrival date & time: 09/12/16  2230     History   Chief Complaint Chief Complaint  Patient presents with  . Abdominal Pain    HPI Melissa Riggs is a 21 y.o. female.  HPI   21 year old female with prior surgical history of appendectomy presenting complaining of abdominal pain. Patient report recurrent right upper quadrant abdominal pain ongoing for the past 2 weeks. Initially pain was short lasting, sharp, nonradiating but for the past several days it has been more intense, described as a dull sensation, with associate nausea, and increasing pain after eating. Currently rates pain as 8 out of 10. She recently returned from a trip to [place] where she stayed for a week and did report change in her diet but denies eating spicy food. No associated fever, chills, chest pain, shortness of breath, productive cough, back pain, dysuria, hematuria, vaginal bleeding or vaginal discharge. Patient states her menstrual period is irregular and unable to recall her last menstruation. She denies any specific treatment tried. Patient denies history of alcohol abuse, no history of diabetes.  History reviewed. No pertinent past medical history.  There are no active problems to display for this patient.   Past Surgical History:  Procedure Laterality Date  . APPENDECTOMY    . CARPAL TUNNEL RELEASE    . CATARACT EXTRACTION, BILATERAL    . TONSILLECTOMY      OB History    No data available       Home Medications    Prior to Admission medications   Medication Sig Start Date End Date Taking? Authorizing Provider  antipyrine-benzocaine Toniann Fail) otic solution Place 3-4 drops into the left ear every 2 (two) hours as needed for ear pain. Patient not taking: Reported on 06/12/2015 01/20/14   Hessie Diener Hess, PA-C  benzonatate (TESSALON) 100 MG capsule Take 1 capsule (100 mg total) by mouth every 8 (eight) hours. 06/12/15   Deno Etienne, DO  cefdinir  (OMNICEF) 300 MG capsule Take 2 capsules (600 mg total) by mouth daily. 06/12/15   Deno Etienne, DO  ciprofloxacin-dexamethasone (CIPRODEX) otic suspension Place 4 drops into the left ear 2 (two) times daily. x5 days Patient not taking: Reported on 06/12/2015 01/20/14   Hessie Diener Hess, PA-C  clindamycin (CLEOCIN) 150 MG capsule Take 2 capsules (300 mg total) by mouth 4 (four) times daily. Patient not taking: Reported on 06/12/2015 06/02/15   Jeannett Senior, PA-C  HYDROcodone-acetaminophen (NORCO/VICODIN) 5-325 MG per tablet Take 1 tablet by mouth every 6 (six) hours as needed. Patient not taking: Reported on 06/12/2015 05/28/14   Varney Biles, MD  hydrocortisone cream 1 % Apply to affected area 2 times daily Patient not taking: Reported on 06/12/2015 06/04/15   Waynetta Pean, PA-C  ibuprofen (ADVIL,MOTRIN) 600 MG tablet Take 1 tablet (600 mg total) by mouth every 6 (six) hours as needed. Patient not taking: Reported on 06/12/2015 05/28/14   Varney Biles, MD  methocarbamol (ROBAXIN) 500 MG tablet Take 1 tablet (500 mg total) by mouth 2 (two) times daily. Patient not taking: Reported on 06/12/2015 05/28/14   Varney Biles, MD  ofloxacin (FLOXIN) 0.3 % otic solution Place 5 drops into the left ear 2 (two) times daily. x5 days Patient not taking: Reported on 06/12/2015 01/20/14   Carman Ching, PA-C  predniSONE (DELTASONE) 20 MG tablet Take 2 tablets (40 mg total) by mouth daily. Patient not taking: Reported on 06/12/2015 06/04/15   Waynetta Pean, PA-C  Family History No family history on file.  Social History Social History  Substance Use Topics  . Smoking status: Never Smoker  . Smokeless tobacco: Never Used  . Alcohol use No     Allergies   Amoxicillin and Penicillins   Review of Systems Review of Systems  All other systems reviewed and are negative.    Physical Exam Updated Vital Signs BP (!) 152/99   Pulse 96   Temp 98.6 F (37 C) (Oral)   Resp 18   Ht 5\' 4"  (1.626 m)   Wt 117.9  kg   SpO2 98%   BMI 44.63 kg/m   Physical Exam  Constitutional: She appears well-developed and well-nourished. No distress.  Obese female laying in bed appears to be mildly uncomfortable but nontoxic in appearance  HENT:  Head: Atraumatic.  Mouth/Throat: Oropharynx is clear and moist.  Eyes: Conjunctivae are normal.  Neck: Neck supple.  Hyperpigmented velvety skin noted around the neck consistence with nigricans acanthosis  Cardiovascular: Normal rate and regular rhythm.   Pulmonary/Chest: Effort normal.  Abdominal: Soft. She exhibits no distension. There is tenderness (Tenderness to epigastric and right upper quadrant on palpation without guarding or rebound tenderness. Negative Murphy sign, no pain at McBurney's point.).  Genitourinary:  Genitourinary Comments: No CVA tenderness  Neurological: She is alert.  Skin: No rash noted.  Psychiatric: She has a normal mood and affect.  Nursing note and vitals reviewed.    ED Treatments / Results  Labs (all labs ordered are listed, but only abnormal results are displayed) Labs Reviewed  COMPREHENSIVE METABOLIC PANEL - Abnormal; Notable for the following:       Result Value   Glucose, Bld 110 (*)    All other components within normal limits  CBC - Abnormal; Notable for the following:    WBC 11.6 (*)    RBC 5.38 (*)    Hemoglobin 15.4 (*)    All other components within normal limits  URINALYSIS, ROUTINE W REFLEX MICROSCOPIC - Abnormal; Notable for the following:    APPearance HAZY (*)    Leukocytes, UA TRACE (*)    Bacteria, UA RARE (*)    Squamous Epithelial / LPF 0-5 (*)    All other components within normal limits  LIPASE, BLOOD  POC URINE PREG, ED  I-STAT BETA HCG BLOOD, ED ([facility], [facility], [facility] ONLY)    EKG  EKG Interpretation None       Radiology US Abdomen Limited  Result Date: 09/13/2016 CLINICAL DATA:  Right upper quadrant pain and nausea for 2 days EXAM: US ABDOMEN LIMITED - RIGHT UPPER QUADRANT COMPARISON:  None.  FINDINGS: Gallbladder: No calculi. No gallbladder mural thickening or pericholecystic fluid. The patient did appear to be tender to probe pressure over the gallbladder. Common bile duct: Diameter: 2.4 mm Liver: Diffuse echogenicity of hepatic parenchyma consistent with fatty infiltration. No focal liver lesion. IMPRESSION: The patient appeared to be tender over the gallbladder, but there are no calculi and no mural thickening or pericholecystic fluid. Diffuse hepatic parenchymal echogenicity likely represents fatty infiltration. Electronically Signed   By: Andreas Newport M.D.   On: 09/13/2016 00:01    Procedures Procedures (including critical care time)  Medications Ordered in ED Medications - No data to display   Initial Impression / Assessment and Plan / ED Course  I have reviewed the triage vital signs and the nursing notes.  Pertinent labs & imaging results that were available during my care of the patient were reviewed by me  and considered in my medical decision making (see chart for details).     BP (!) 152/99   Pulse 96   Temp 98.6 F (37 C) (Oral)   Resp 18   Ht 5\' 4"  (1.626 m)   Wt 117.9 kg   SpO2 98%   BMI 44.63 kg/m    Final Clinical Impressions(s) / ED Diagnoses   Final diagnoses:  Upper abdominal pain  Nausea    New Prescriptions New Prescriptions   OMEPRAZOLE (PRILOSEC) 20 MG CAPSULE    Take 1 capsule (20 mg total) by mouth 2 (two) times daily before a meal.   RANITIDINE (ZANTAC) 150 MG CAPSULE    Take 1 capsule (150 mg total) by mouth daily.   10:58 PM Obese female presents with right upper quadrant abdominal pain nausea, and postprandial pain suggestive of bilious etiology. She is afebrile with stable normal normal vital sign. Will provide symptomatic treatment, obtain limited abdominal ultrasound for further evaluation.  12:37 AM  preg test negative, ua without UTI, normal lipase, WBC mildly elevated at 11.6, electrolytes normal.  abd [place] without acute  finding.  Pt felt better after morphine and GI cocktail.  Unremarkable abd exam in reexamination.  Suspect gastritis.  Recommend outpt f/u with GI as needed.  Will d/c with prilosec and zantac.  Return precaution given.     Domenic Moras, PA-C 09/13/16 [place], MD 09/13/16 [phone]

## 2016-09-12 NOTE — ED Triage Notes (Signed)
Pt presents to the ed for complaints of pain in her right upper abdomen with some nausea.  She just went to [place] and got back yesterday, states she was constipated while she was there. Pain is worse after meals.

## 2016-09-12 NOTE — ED Notes (Signed)
Patient transported to Ultrasound 

## 2016-09-13 MED ORDER — OMEPRAZOLE 20 MG PO CPDR: 20.0000 mg | DELAYED_RELEASE_CAPSULE | Freq: Two times a day (BID) | ORAL | 0 refills | Status: DC

## 2016-09-13 MED ORDER — RANITIDINE HCL 150 MG PO CAPS: 150.0000 mg | ORAL_CAPSULE | Freq: Every day | ORAL | 0 refills | Status: DC

## 2016-09-13 NOTE — Discharge Instructions (Signed)
Follow up with GI specialist if your pain worsen.  Return to the ER if you have any concerns.

## 2016-10-19 ENCOUNTER — Emergency Department (HOSPITAL_COMMUNITY)
Admission: EM | Admit: 2016-10-19 | Discharge: 2016-10-19 | Disposition: A | Discharge status: Home / Self Care | Payer: Self-pay | Attending: Emergency Medicine | Admitting: Emergency Medicine

## 2016-10-19 ENCOUNTER — Emergency Department (HOSPITAL_COMMUNITY): Payer: Self-pay

## 2016-10-19 ENCOUNTER — Emergency Department (HOSPITAL_BASED_OUTPATIENT_CLINIC_OR_DEPARTMENT_OTHER)
Admit: 2016-10-19 | Discharge: 2016-10-19 | Disposition: A | Discharge status: Home / Self Care | Payer: Self-pay | Attending: Emergency Medicine | Admitting: Emergency Medicine

## 2016-10-19 ENCOUNTER — Encounter (HOSPITAL_COMMUNITY): Payer: Self-pay | Admitting: Emergency Medicine

## 2016-10-19 DIAGNOSIS — M79609 Pain in unspecified limb: Secondary | ICD-10-CM

## 2016-10-19 DIAGNOSIS — M7989 Other specified soft tissue disorders: Secondary | ICD-10-CM | POA: Insufficient documentation

## 2016-10-19 LAB — CBC WITH DIFFERENTIAL/PLATELET
Basophils Absolute: 0 10*3/uL (ref 0.0–0.1)
Basophils Relative: 0 %
Eosinophils Absolute: 0.3 10*3/uL (ref 0.0–0.7)
Eosinophils Relative: 3 %
HEMATOCRIT: 43.8 % (ref 36.0–46.0)
Hemoglobin: 14.9 g/dL (ref 12.0–15.0)
LYMPHS ABS: 2.7 10*3/uL (ref 0.7–4.0)
LYMPHS PCT: 35 %
MCH: 28.5 pg (ref 26.0–34.0)
MCHC: 34 g/dL (ref 30.0–36.0)
MCV: 83.7 fL (ref 78.0–100.0)
MONO ABS: 0.4 10*3/uL (ref 0.1–1.0)
MONOS PCT: 5 %
NEUTROS ABS: 4.4 10*3/uL (ref 1.7–7.7)
Neutrophils Relative %: 57 %
PLATELETS: 270 10*3/uL (ref 150–400)
RBC: 5.23 MIL/uL — ABNORMAL HIGH (ref 3.87–5.11)
RDW: 12.4 % (ref 11.5–15.5)
WBC: 7.8 10*3/uL (ref 4.0–10.5)

## 2016-10-19 LAB — SEDIMENTATION RATE: Sed Rate: 1 mm/hr (ref 0–22)

## 2016-10-19 LAB — BASIC METABOLIC PANEL
ANION GAP: 8 (ref 5–15)
BUN: 8 mg/dL (ref 6–20)
CO2: 24 mmol/L (ref 22–32)
Calcium: 9.3 mg/dL (ref 8.9–10.3)
Chloride: 109 mmol/L (ref 101–111)
Creatinine, Ser: 0.76 mg/dL (ref 0.44–1.00)
GLUCOSE: 83 mg/dL (ref 65–99)
POTASSIUM: 3.9 mmol/L (ref 3.5–5.1)
Sodium: 141 mmol/L (ref 135–145)

## 2016-10-19 LAB — I-STAT BETA HCG BLOOD, ED (MC, WL, AP ONLY)

## 2016-10-19 LAB — CK: Total CK: 230 U/L (ref 38–234)

## 2016-10-19 LAB — C-REACTIVE PROTEIN

## 2016-10-19 MED ORDER — OXYCODONE-ACETAMINOPHEN 5-325 MG PO TABS
1.0000 | ORAL_TABLET | Freq: Once | ORAL | Status: AC
  Administered 2016-10-19: 1 via ORAL
  Filled 2016-10-19: qty 1

## 2016-10-19 MED ORDER — ACETAMINOPHEN 325 MG PO TABS: 650.0000 mg | ORAL_TABLET | Freq: Four times a day (QID) | ORAL | 0 refills | Status: DC | PRN

## 2016-10-19 MED ORDER — SULFAMETHOXAZOLE-TRIMETHOPRIM 800-160 MG PO TABS: 1.0000 | ORAL_TABLET | Freq: Two times a day (BID) | ORAL | 0 refills | Status: AC

## 2016-10-19 MED ORDER — IBUPROFEN 400 MG PO TABS
600.0000 mg | ORAL_TABLET | Freq: Once | ORAL | Status: AC
  Administered 2016-10-19: 600 mg via ORAL
  Filled 2016-10-19: qty 1

## 2016-10-19 MED ORDER — IBUPROFEN 600 MG PO TABS: 600.0000 mg | ORAL_TABLET | Freq: Four times a day (QID) | ORAL | 0 refills | Status: DC | PRN

## 2016-10-19 NOTE — ED Triage Notes (Addendum)
Pt reports that her arm began hurting Saturday night, pain progressed to just her right hand now, has been using ice and otc pain meds with no relief. Some swelling present to right hand.

## 2016-10-19 NOTE — Discharge Instructions (Signed)
°  All the results in the ER are normal, labs and imaging. We are not sure what is causing your symptoms. The workup in the ER is not complete, and is limited to screening for life threatening and emergent conditions only, so please see a primary care doctor for further evaluation.  If you notice increase in swelling, redness, pain - return to the ER. Please get a primary doctor appt as soon as possible.

## 2016-10-19 NOTE — Progress Notes (Signed)
*  PRELIMINARY RESULTS* Vascular Ultrasound Right upper extremity venous duplex has been completed.  Preliminary findings: No evidence of DVT or superficial thrombosis.    Landry Mellow, RDMS, RVT  10/19/2016, 1:52 PM

## 2016-10-19 NOTE — ED Provider Notes (Signed)
[facility] DEPT Provider Note   CSN: [id number] Arrival date & time: 10/19/16  1036  By signing my name below, I, Levester Fresh, attest that this documentation has been prepared under the direction and in the presence of Varney Biles, MD . Electronically Signed: Levester Fresh, Scribe. 10/19/2016. 5:07 PM.  History   Chief Complaint Chief Complaint  Patient presents with  . Hand Pain   Melissa Riggs is a 21 y.o. female with a PMHx of carpal tunnel syndrome of L wrist, who presents to the Emergency Department with complaints of right hand pain x2 days, rated 8/10 in severity. Sx began intermittently in pt's R wrist and forearm, however she has no pain there now. She also endorses numbness and tingling in fingers, but only with movement. She is right handed, which brought her in today. No recent trauma or injury.   Pt denies experiencing any other acute sx, including pain in shoulders or pain in elbow. No hx of blood blots. No use of birth control, hormone or estrogen therapy. No recent long trips or car rides.  No venous procedures over the chest or neck.  The history is provided by the patient and medical records. No language interpreter was used.   No past medical history on file.  There are no active problems to display for this patient.  Past Surgical History:  Procedure Laterality Date  . APPENDECTOMY    . CARPAL TUNNEL RELEASE    . CATARACT EXTRACTION, BILATERAL    . TONSILLECTOMY      OB History    No data available     Home Medications    Prior to Admission medications   Medication Sig Start Date End Date Taking? Authorizing Provider  acetaminophen (TYLENOL) 325 MG tablet Take 2 tablets (650 mg total) by mouth every 6 (six) hours as needed. 10/19/16   Varney Biles, MD  ibuprofen (ADVIL,MOTRIN) 600 MG tablet Take 1 tablet (600 mg total) by mouth every 6 (six) hours as needed. 10/19/16   Varney Biles, MD  omeprazole (PRILOSEC) 20 MG capsule Take 1  capsule (20 mg total) by mouth 2 (two) times daily before a meal. 09/13/16   Domenic Moras, PA-C  ranitidine (ZANTAC) 150 MG capsule Take 1 capsule (150 mg total) by mouth daily. 09/13/16   Domenic Moras, PA-C  sulfamethoxazole-trimethoprim (BACTRIM DS,SEPTRA DS) 800-160 MG tablet Take 1 tablet by mouth 2 (two) times daily. 10/19/16 10/26/16  Varney Biles, MD   Family History No family history on file.  Social History Social History  Substance Use Topics  . Smoking status: Never Smoker  . Smokeless tobacco: Never Used  . Alcohol use No    Allergies   Amoxicillin and Penicillins   Review of Systems Review of Systems  Constitutional: Negative for fever.  Gastrointestinal: Negative for nausea and vomiting.  Musculoskeletal: Positive for arthralgias and joint swelling.   Physical Exam Updated Vital Signs BP 112/77 (BP Location: Left Arm)   Pulse 64   Temp 98.4 F (36.9 C)   Resp 15   LMP  (LMP Unknown)   SpO2 98%   Physical Exam  Constitutional: She is oriented to person, place, and time. She appears well-developed and well-nourished. No distress.  HENT:  Head: Normocephalic and atraumatic.  Eyes: Pupils are equal, round, and reactive to light.  Cardiovascular: Normal rate.   Pulmonary/Chest: Effort normal.  Musculoskeletal:  Swelling over the RUE. Compartments over the arm and forearm are still soft. Passive rage of motion over  the shoulder, elbow or wrist over the right side does not result in any significant tenderness. Passive range of motion of the digits does produce tenderness over the MPP joint. The pt's 2 point discrimination over the palmar aspect of the hand is intact, and pt is able to discriminate between sharp and dull. There are subjective paraesthesias. No redness or warmth to touch of RUE.  Neurological: She is alert and oriented to person, place, and time.  Skin: Skin is warm and dry.  Psychiatric: She has a normal mood and affect.  Nursing note and vitals  reviewed.  ED Treatments / Results  DIAGNOSTIC STUDIES:   COORDINATION OF CARE: 12:20 PM Discussed treatment plan with pt at bedside and pt agreed to plan.   3:10 PM Updated pt on radiology results car. She is resting comfortably in bed with no complaints.   Labs (all labs ordered are listed, but only abnormal results are displayed) Labs Reviewed  CBC WITH DIFFERENTIAL/PLATELET - Abnormal; Notable for the following:       Result Value   RBC 5.23 (*)    All other components within normal limits  BASIC METABOLIC PANEL  SEDIMENTATION RATE  C-REACTIVE PROTEIN  CK  I-STAT BETA HCG BLOOD, ED ([facility], [facility], [facility] ONLY)    EKG  EKG Interpretation None       Radiology Dg Chest 2 View  Result Date: 10/19/2016 CLINICAL DATA:  Pain and swelling in the right arm beginning 2 days ago. EXAM: CHEST  2 VIEW COMPARISON:  06/11/2015 FINDINGS: Heart size is normal. Mediastinal shadows are normal. The lungs are clear. No bronchial thickening. No infiltrate, mass, effusion or collapse. Pulmonary vascularity is normal. No bony abnormality. IMPRESSION: Normal chest Electronically Signed   By: Nelson Chimes M.D.   On: 10/19/2016 15:54   Dg Hand Complete Right  Result Date: 10/19/2016 CLINICAL DATA:  Pain and swelling of the right hand. EXAM: RIGHT HAND - COMPLETE 3+ VIEW COMPARISON:  None. FINDINGS: There is no evidence of fracture or dislocation. There is no evidence of arthropathy or other focal bone abnormality. Soft tissue swelling. IMPRESSION: Soft tissue swelling.  No acute bone abnormality. Electronically Signed   By: Lorriane Shire M.D.   On: 10/19/2016 11:27    Procedures Procedures (including critical care time)  Medications Ordered in ED Medications  oxyCODONE-acetaminophen (PERCOCET/ROXICET) 5-325 MG per tablet 1 tablet (1 tablet Oral Given 10/19/16 1603)  ibuprofen (ADVIL,MOTRIN) tablet 600 mg (600 mg Oral Given 10/19/16 1602)     Initial Impression / Assessment and Plan / ED Course  I  have reviewed the triage vital signs and the nursing notes.  Pertinent labs & imaging results that were available during my care of the patient were reviewed by me and considered in my medical decision making (see chart for details).  Clinical Course as of Oct 19 1705  Mon Oct 19, 2016  1429 Pt's arm xrays - swelling seen. Labs unremarkable. CRP is neg. Pt's DVT study is neg.  Now adding CXR to ensure there is no mass over the chest. If workup neg, we will discuss the findings with the patient and decide on the next step. DG Hand Complete Right [AN]  6759 Pt's workup neg. I have decided to start pt on bactrim after sharing the results with her and printing the d/c paperwork. Strict ER return precautions have been discussed, and patient is agreeing with the plan and is comfortable with the workup done and the recommendations from the ER.   [  AN]    Clinical Course User Index [AN] Varney Biles, MD    Pt comes in with cc of R arm pain and swelling. Pt has no redness, warmth to touch - thus there is no clear signs of infection. Pt denies trauma. Pt has no DVT risk factor - but with the swelling and pain, she is not a candidate for DImer, so [place] ordered.   Final Clinical Impressions(s) / ED Diagnoses   Final diagnoses:  Left arm swelling    New Prescriptions New Prescriptions   ACETAMINOPHEN (TYLENOL) 325 MG TABLET    Take 2 tablets (650 mg total) by mouth every 6 (six) hours as needed.   IBUPROFEN (ADVIL,MOTRIN) 600 MG TABLET    Take 1 tablet (600 mg total) by mouth every 6 (six) hours as needed.   SULFAMETHOXAZOLE-TRIMETHOPRIM (BACTRIM DS,SEPTRA DS) 800-160 MG TABLET    Take 1 tablet by mouth 2 (two) times daily.   I personally performed the services described in this documentation, which was scribed in my presence. The recorded information has been reviewed and is accurate.    Varney Biles, MD 10/19/16 [phone]

## 2017-05-22 ENCOUNTER — Encounter (HOSPITAL_COMMUNITY): Payer: Self-pay | Admitting: Emergency Medicine

## 2017-05-22 ENCOUNTER — Emergency Department (HOSPITAL_COMMUNITY): Payer: Self-pay

## 2017-05-22 ENCOUNTER — Emergency Department (HOSPITAL_COMMUNITY)
Admission: EM | Admit: 2017-05-22 | Discharge: 2017-05-22 | Disposition: A | Discharge status: Home / Self Care | Payer: Self-pay | Attending: Emergency Medicine | Admitting: Emergency Medicine

## 2017-05-22 ENCOUNTER — Other Ambulatory Visit: Payer: Self-pay

## 2017-05-22 DIAGNOSIS — K6389 Other specified diseases of intestine: Secondary | ICD-10-CM

## 2017-05-22 DIAGNOSIS — K388 Other specified diseases of appendix: Secondary | ICD-10-CM | POA: Insufficient documentation

## 2017-05-22 DIAGNOSIS — R197 Diarrhea, unspecified: Secondary | ICD-10-CM | POA: Insufficient documentation

## 2017-05-22 DIAGNOSIS — K529 Noninfective gastroenteritis and colitis, unspecified: Secondary | ICD-10-CM

## 2017-05-22 DIAGNOSIS — R112 Nausea with vomiting, unspecified: Secondary | ICD-10-CM | POA: Insufficient documentation

## 2017-05-22 DIAGNOSIS — Z79899 Other long term (current) drug therapy: Secondary | ICD-10-CM | POA: Insufficient documentation

## 2017-05-22 LAB — URINALYSIS, ROUTINE W REFLEX MICROSCOPIC
Bacteria, UA: NONE SEEN
Bilirubin Urine: NEGATIVE
Glucose, UA: NEGATIVE mg/dL
Hgb urine dipstick: NEGATIVE
Ketones, ur: NEGATIVE mg/dL
Nitrite: NEGATIVE
Protein, ur: 30 mg/dL — AB
Specific Gravity, Urine: 1.026 (ref 1.005–1.030)
pH: 7 (ref 5.0–8.0)

## 2017-05-22 LAB — I-STAT BETA HCG BLOOD, ED (MC, WL, AP ONLY): I-stat hCG, quantitative: <5 m[IU]/mL (ref <5)

## 2017-05-22 LAB — COMPREHENSIVE METABOLIC PANEL
ALT: 42 U/L (ref 14–54)
AST: 24 U/L (ref 15–41)
Albumin: 4 g/dL (ref 3.5–5.0)
Alkaline Phosphatase: 65 U/L (ref 38–126)
Anion gap: 9 (ref 5–15)
BUN: 7 mg/dL (ref 6–20)
CO2: 22 mmol/L (ref 22–32)
Calcium: 9 mg/dL (ref 8.9–10.3)
Chloride: 106 mmol/L (ref 101–111)
Creatinine, Ser: 0.71 mg/dL (ref 0.44–1.00)
GFR calc Af Amer: >60 mL/min (ref >60)
GFR calc non Af Amer: >60 mL/min (ref >60)
Glucose, Bld: 115 mg/dL — ABNORMAL HIGH (ref 65–99)
Potassium: 3.6 mmol/L (ref 3.5–5.1)
Sodium: 137 mmol/L (ref 135–145)
Total Bilirubin: 0.6 mg/dL (ref 0.3–1.2)
Total Protein: 6.8 g/dL (ref 6.5–8.1)

## 2017-05-22 LAB — CBC
HCT: 44 % (ref 36.0–46.0)
Hemoglobin: 15.5 g/dL — ABNORMAL HIGH (ref 12.0–15.0)
MCH: 29 pg (ref 26.0–34.0)
MCHC: 35.2 g/dL (ref 30.0–36.0)
MCV: 82.4 fL (ref 78.0–100.0)
Platelets: 304 10*3/uL (ref 150–400)
RBC: 5.34 MIL/uL — ABNORMAL HIGH (ref 3.87–5.11)
RDW: 12.2 % (ref 11.5–15.5)
WBC: 9.4 10*3/uL (ref 4.0–10.5)

## 2017-05-22 LAB — LIPASE, BLOOD: Lipase: 33 U/L (ref 11–51)

## 2017-05-22 MED ORDER — IBUPROFEN 600 MG PO TABS: 600.0000 mg | ORAL_TABLET | Freq: Four times a day (QID) | ORAL | 0 refills | Status: DC | PRN

## 2017-05-22 MED ORDER — TRAMADOL HCL 50 MG PO TABS: 50.0000 mg | ORAL_TABLET | Freq: Four times a day (QID) | ORAL | 0 refills | Status: DC | PRN

## 2017-05-22 MED ORDER — IOPAMIDOL (ISOVUE-300) INJECTION 61%
INTRAVENOUS | Status: AC
  Administered 2017-05-22: 100 mL
  Filled 2017-05-22: qty 100

## 2017-05-22 MED ORDER — MORPHINE SULFATE (PF) 4 MG/ML IV SOLN
6.0000 mg | Freq: Once | INTRAVENOUS | Status: AC
  Administered 2017-05-22: 6 mg via INTRAVENOUS
  Filled 2017-05-22: qty 2

## 2017-05-22 MED ORDER — ONDANSETRON HCL 4 MG/2ML IJ SOLN
4.0000 mg | Freq: Once | INTRAMUSCULAR | Status: AC
  Administered 2017-05-22: 4 mg via INTRAVENOUS
  Filled 2017-05-22: qty 2

## 2017-05-22 MED ORDER — SODIUM CHLORIDE 0.9 % IV BOLUS (SEPSIS)
1000.0000 mL | Freq: Once | INTRAVENOUS | Status: AC
  Administered 2017-05-22: 1000 mL via INTRAVENOUS

## 2017-05-22 MED ORDER — KETOROLAC TROMETHAMINE 15 MG/ML IJ SOLN
15.0000 mg | Freq: Once | INTRAMUSCULAR | Status: AC
  Administered 2017-05-22: 15 mg via INTRAVENOUS
  Filled 2017-05-22: qty 1

## 2017-05-22 NOTE — ED Triage Notes (Signed)
Pt states 3 days of left lower quadrant abdominal pain radiating into the back with diarrhea. Denies nausea. Pain 8/10. LMP unknown. LLQ is tender to palpation.

## 2017-05-22 NOTE — ED Notes (Signed)
Patient transported to CT 

## 2017-06-07 NOTE — ED Provider Notes (Signed)
[facility] EMERGENCY DEPARTMENT Provider Note   CSN: [id number] Arrival date & time: 05/22/17  1514     History   Chief Complaint Chief Complaint  Patient presents with  . Abdominal Pain    HPI Melissa Riggs is a 21 y.o. female.  HPI   21 year old female with abdominal pain.  Onset 3 days ago.  Pain is in the left flank to left lower quadrant.  Radiates from the back.  Associated with diarrhea.  No nausea or vomiting.  No fevers or chills.  No urinary complaints.  No blood in her stool. No unusual vaginal bleeding or discharge.  History reviewed. No pertinent past medical history.  There are no active problems to display for this patient.   Past Surgical History:  Procedure Laterality Date  . APPENDECTOMY    . CARPAL TUNNEL RELEASE    . CATARACT EXTRACTION, BILATERAL    . TONSILLECTOMY      OB History    No data available       Home Medications    Prior to Admission medications   Medication Sig Start Date End Date Taking? Authorizing Provider  acetaminophen (TYLENOL) 325 MG tablet Take 2 tablets (650 mg total) by mouth every 6 (six) hours as needed. 10/19/16  Yes Varney Biles, MD  ibuprofen (ADVIL,MOTRIN) 600 MG tablet Take 1 tablet (600 mg total) by mouth every 6 (six) hours as needed. 10/19/16  Yes Varney Biles, MD  ibuprofen (ADVIL,MOTRIN) 600 MG tablet Take 1 tablet (600 mg total) by mouth every 6 (six) hours as needed. 05/22/17   Virgel Manifold, MD  omeprazole (PRILOSEC) 20 MG capsule Take 1 capsule (20 mg total) by mouth 2 (two) times daily before a meal. Patient not taking: Reported on 05/22/2017 09/13/16   Domenic Moras, PA-C  ranitidine (ZANTAC) 150 MG capsule Take 1 capsule (150 mg total) by mouth daily. Patient not taking: Reported on 05/22/2017 09/13/16   Domenic Moras, PA-C  traMADol (ULTRAM) 50 MG tablet Take 1 tablet (50 mg total) by mouth every 6 (six) hours as needed. 05/22/17   Virgel Manifold, MD    Family History No family  history on file.  Social History Social History   Tobacco Use  . Smoking status: Never Smoker  . Smokeless tobacco: Never Used  Substance Use Topics  . Alcohol use: No  . Drug use: No     Allergies   Amoxicillin and Penicillins   Review of Systems Review of Systems All systems reviewed and negative, other than as noted in HPI.   Physical Exam Updated Vital Signs BP (!) 148/60   Pulse 90   Temp 98.3 F (36.8 C) (Oral)   Resp 16   Ht 5\' 2"  (1.575 m)   Wt 122.5 kg (270 lb)   SpO2 100%   BMI 49.38 kg/m   Physical Exam  Constitutional: She appears well-developed and well-nourished. No distress.  HENT:  Head: Normocephalic and atraumatic.  Eyes: Conjunctivae are normal. Right eye exhibits no discharge. Left eye exhibits no discharge.  Neck: Neck supple.  Cardiovascular: Normal rate, regular rhythm and normal heart sounds. Exam reveals no gallop and no friction rub.  No murmur heard. Pulmonary/Chest: Effort normal and breath sounds normal. No respiratory distress.  Abdominal: Soft. She exhibits no distension. There is no tenderness.  Tenderness in the left lower quadrant and to a lesser degree suprapubically.  No rebound or guarding.  No distention.  Musculoskeletal: She exhibits no edema or tenderness.  Neurological:  She is alert.  Skin: Skin is warm and dry.  Psychiatric: She has a normal mood and affect. Her behavior is normal. Thought content normal.  Nursing note and vitals reviewed.    ED Treatments / Results  Labs (all labs ordered are listed, but only abnormal results are displayed) Labs Reviewed  COMPREHENSIVE METABOLIC PANEL - Abnormal; Notable for the following components:      Result Value   Glucose, Bld 115 (*)    All other components within normal limits  CBC - Abnormal; Notable for the following components:   RBC 5.34 (*)    Hemoglobin 15.5 (*)    All other components within normal limits  URINALYSIS, ROUTINE W REFLEX MICROSCOPIC - Abnormal;  Notable for the following components:   Protein, ur 30 (*)    Leukocytes, UA TRACE (*)    Squamous Epithelial / LPF 0-5 (*)    All other components within normal limits  LIPASE, BLOOD  I-STAT BETA HCG BLOOD, ED ([facility], [facility], [facility] ONLY)    EKG  EKG Interpretation None       Radiology No results found.   Ct Abdomen Pelvis W Contrast  Result Date: 05/22/2017 CLINICAL DATA:  Abdominal distension and left lower quadrant pain. EXAM: CT ABDOMEN AND PELVIS WITH CONTRAST TECHNIQUE: Multidetector CT imaging of the abdomen and pelvis was performed using the standard protocol following bolus administration of intravenous contrast. CONTRAST:  100 cc ISOVUE-300 IOPAMIDOL (ISOVUE-300) INJECTION 61% COMPARISON:  Right upper quadrant ultrasound 09/12/2016 FINDINGS: Lower chest: Patchy ground-glass opacities in both lower lobes favoring atelectasis. Hepatobiliary: Diffusely decreased hepatic density consistent with steatosis. Liver is enlarged spanning 24 cm cranial caudal. No evidence of focal lesion. Gallbladder physiologically distended, no calcified stone. No biliary dilatation. Pancreas: No ductal dilatation or inflammation. Spleen: Normal in size without focal abnormality. Splenule at the hilum. Adrenals/Urinary Tract: Adrenal glands are unremarkable. Kidneys are normal, without renal calculi, focal lesion, or hydronephrosis. Early excretion of IV contrast within both renal collecting systems. Bladder is unremarkable. Stomach/Bowel: Inflamed 3 cm fat lobule arising from the anti mesenteric border of the descending sigmoid colonic junction with surrounding fat stranding consistent with epiploic appendagitis. No diverticular disease. No colonic wall thickening. Small volume of colonic stool. No small bowel dilatation, obstruction or wall thickening. Stomach is unremarkable. Appendix not visualized, post appendectomy. Vascular/Lymphatic: Prominent ileocecal nodes likely reactive. No bulky adenopathy. No acute  vascular findings. Reproductive: Uterus and bilateral adnexa are unremarkable. Other: No free air, free fluid or intra-abdominal fluid collection. No intra-abdominal abscess. Musculoskeletal: There are no acute or suspicious osseous abnormalities. IMPRESSION: 1. Acute epiploic appendagitis of the proximal sigmoid colon. 2. Incidental hepatomegaly and hepatic steatosis. Electronically Signed   By: Jeb Levering M.D.   On: 05/22/2017 20:23    Procedures Procedures (including critical care time)  Medications Ordered in ED Medications  sodium chloride 0.9 % bolus 1,000 mL (0 mLs Intravenous Stopped 05/22/17 2137)  ketorolac (TORADOL) 15 MG/ML injection 15 mg (15 mg Intravenous Given 05/22/17 1922)  morphine 4 MG/ML injection 6 mg (6 mg Intravenous Given 05/22/17 1922)  ondansetron (ZOFRAN) injection 4 mg (4 mg Intravenous Given 05/22/17 1922)  iopamidol (ISOVUE-300) 61 % injection (100 mLs  Contrast Given 05/22/17 2003)     Initial Impression / Assessment and Plan / ED Course  I have reviewed the triage vital signs and the nursing notes.  Pertinent labs & imaging results that were available during my care of the patient were reviewed by me and considered in  my medical decision making (see chart for details).     21 year old female with left-sided abdominal pain.  CT significant for likely epiploic appendagitis  Results were discussed with patient.  Expected course was reviewed.  Symptomatic treatment.  Return precautions discussed.  Final Clinical Impressions(s) / ED Diagnoses   Final diagnoses:  Epiploic appendagitis  Nausea vomiting and diarrhea    ED Discharge Orders        Ordered    ibuprofen (ADVIL,MOTRIN) 600 MG tablet  Every 6 hours PRN     05/22/17 2131    traMADol (ULTRAM) 50 MG tablet  Every 6 hours PRN     05/22/17 2131       Virgel Manifold, MD 06/07/17 [phone]

## 2018-03-02 ENCOUNTER — Ambulatory Visit (INDEPENDENT_AMBULATORY_CARE_PROVIDER_SITE_OTHER): Payer: BLUE CROSS/BLUE SHIELD | Admitting: Family Medicine

## 2018-03-02 ENCOUNTER — Encounter: Payer: Self-pay | Admitting: Family Medicine

## 2018-03-02 ENCOUNTER — Encounter: Payer: Self-pay | Admitting: Emergency Medicine

## 2018-03-02 VITALS — BP 112/86 | HR 101 | Temp 98.2°F | Ht 62.0 in | Wt 264.8 lb

## 2018-03-02 DIAGNOSIS — L83 Acanthosis nigricans: Secondary | ICD-10-CM | POA: Diagnosis not present

## 2018-03-02 DIAGNOSIS — N970 Female infertility associated with anovulation: Secondary | ICD-10-CM

## 2018-03-02 DIAGNOSIS — E039 Hypothyroidism, unspecified: Secondary | ICD-10-CM

## 2018-03-02 DIAGNOSIS — Z124 Encounter for screening for malignant neoplasm of cervix: Secondary | ICD-10-CM | POA: Diagnosis not present

## 2018-03-02 DIAGNOSIS — N911 Secondary amenorrhea: Secondary | ICD-10-CM

## 2018-03-02 LAB — POCT URINE PREGNANCY: Preg Test, Ur: NEGATIVE

## 2018-03-02 NOTE — Assessment & Plan Note (Signed)
History of hypothyroidism, relatively asymptomatic other than anovulation/amenorrhea.  Check TSH + FT4

## 2018-03-02 NOTE — Patient Instructions (Signed)
It was very nice to meet you! We'll be in touch with lab results once they return.

## 2018-03-02 NOTE — Assessment & Plan Note (Signed)
Possibly related to untreated hypothyroidism, PCOS or other endocrine problem. Labs ordered today.  Orders Placed This Encounter  Procedures  . TSH  . T4, free  . Testosterone  . Testosterone, free  . Comp Met (CMET)  . HgB A1c  . FSH  . LH  . Prolactin  . POCT urine pregnancy

## 2018-03-02 NOTE — Progress Notes (Signed)
Melissa Riggs - 22 y.o. female MRN [id number]  Date of birth: Sep 22, 1995  Subjective Chief Complaint  Patient presents with  . Hypothyroidism    HPI Melissa Riggs is a 22 y.o. female here today to establish with new pcp.  She is concerned about hypothyroidism.  She reports that as a child she was diagnosed with what she recalls as hypothyroidism.  She was started on treatment however was lost to follow up and did not continue treatment.  Since that time she has struggled with her weight and has noticed darkened areas around her neck, axilla, groin and behind the knees.  She does recall that she was told in the past that she may have some problems with too much insulin as well.  She reports that she has not had a menstrual period in 4-5 years, including any spotting.  She denies increased fatigue, changes to bowels including constipation, edema, polyuria, polydipsia.  She does desire to become pregnant as some point in the future.  Interestingly, she had carpal tunnel surgery and bilateral cataract removal at a young age.   ROS:  A comprehensive ROS was completed and negative except as noted per HPI   Allergies  Allergen Reactions  . Amoxicillin Diarrhea, Nausea And Vomiting and Other (See Comments)    dehydration  . Penicillins Diarrhea, Nausea And Vomiting and Other (See Comments)    Dehydration Has patient had a PCN reaction causing immediate rash, facial/tongue/throat swelling, SOB or lightheadedness with hypotension: No Has patient had a PCN reaction causing severe rash involving mucus membranes or skin necrosis: No Has patient had a PCN reaction that required hospitalization: Yes Has patient had a PCN reaction occurring within the last 10 years: Yes If all of the above answers are "NO", then may proceed with Cephalosporin use.    History reviewed. No pertinent past medical history.  Past Surgical History:  Procedure Laterality Date  . APPENDECTOMY    . CARPAL TUNNEL RELEASE      . CATARACT EXTRACTION, BILATERAL    . TONSILLECTOMY      Social History   Socioeconomic History  . Marital status: Married    Spouse name: Not on file  . Number of children: Not on file  . Years of education: Not on file  . Highest education level: Not on file  Occupational History  . Not on file  Social Needs  . Financial resource strain: Not on file  . Food insecurity:    Worry: Not on file    Inability: Not on file  . Transportation needs:    Medical: Not on file    Non-medical: Not on file  Tobacco Use  . Smoking status: Never Smoker  . Smokeless tobacco: Never Used  Substance and Sexual Activity  . Alcohol use: No  . Drug use: No  . Sexual activity: Yes    Birth control/protection: None  Lifestyle  . Physical activity:    Days per week: Not on file    Minutes per session: Not on file  . Stress: Not on file  Relationships  . Social connections:    Talks on phone: Not on file    Gets together: Not on file    Attends religious service: Not on file    Active member of club or organization: Not on file    Attends meetings of clubs or organizations: Not on file    Relationship status: Not on file  Other Topics Concern  . Not on file  Social History Narrative  . Not on file    History reviewed. No pertinent family history.  Health Maintenance  Topic Date Due  . TETANUS/TDAP  02/12/2013  . PAP SMEAR  02/13/2015  . INFLUENZA VACCINE  02/16/2019 (Originally 01/06/2018)  . CHLAMYDIA SCREENING  03/03/2019 (Originally 02/12/2009)  . HIV Screening  03/03/2019 (Originally 02/12/2009)    ----------------------------------------------------------------------------------------------------------------------------------------------------------------------------------------------------------------- Physical Exam BP 112/86 (BP Location: Left Arm, Patient Position: Sitting, Cuff Size: Large)   Pulse (!) 101   Temp 98.2 F (36.8 C) (Oral)   Ht _0  (1.575 m)   Wt 264 lb  12.8 oz (120.1 kg)   LMP 06/08/2014   SpO2 96%   BMI 48.43 kg/m   Physical Exam  Constitutional: She is oriented to person, place, and time. She appears well-nourished. No distress.  Obese   HENT:  Head: Normocephalic and atraumatic.  Mouth/Throat: Oropharynx is clear and moist.  Eyes: No scleral icterus.  Neck: No thyromegaly present.  Cardiovascular: Normal rate, regular rhythm and normal heart sounds.  Pulmonary/Chest: Effort normal and breath sounds normal.  Musculoskeletal: She exhibits no edema.  Neurological: She is alert and oriented to person, place, and time. She displays normal reflexes. No cranial nerve deficit.  Skin:  Acanthosis nigricans of neck, underarms, popliteal fossa   Psychiatric: She has a normal mood and affect. Her behavior is normal.    ------------------------------------------------------------------------------------------------------------------------------------------------------------------------------------------------------------------- Assessment and Plan  Anovulation Possibly related to untreated hypothyroidism, PCOS or other endocrine problem. Labs ordered today.  Orders Placed This Encounter  Procedures  . TSH  . T4, free  . Testosterone  . Testosterone, free  . Comp Met (CMET)  . HgB A1c  . FSH  . LH  . Prolactin  . POCT urine pregnancy     Hypothyroidism History of hypothyroidism, relatively asymptomatic other than anovulation/amenorrhea.  Check TSH + FT4

## 2018-03-03 LAB — COMPREHENSIVE METABOLIC PANEL
ALT: 31 U/L (ref 0–35)
AST: 18 U/L (ref 0–37)
Albumin: 4.5 g/dL (ref 3.5–5.2)
Alkaline Phosphatase: 64 U/L (ref 39–117)
BUN: 12 mg/dL (ref 6–23)
CALCIUM: 9.8 mg/dL (ref 8.4–10.5)
CHLORIDE: 105 meq/L (ref 96–112)
CO2: 25 meq/L (ref 19–32)
Creatinine, Ser: 0.85 mg/dL (ref 0.40–1.20)
GFR: 87.3 mL/min (ref >60.00)
Glucose, Bld: 88 mg/dL (ref 70–99)
Potassium: 3.8 mEq/L (ref 3.5–5.1)
Sodium: 140 mEq/L (ref 135–145)
Total Bilirubin: 0.3 mg/dL (ref 0.2–1.2)
Total Protein: 7 g/dL (ref 6.0–8.3)

## 2018-03-03 LAB — TESTOSTERONE: Testosterone: 89.71 ng/dL — ABNORMAL HIGH (ref 15.00–40.00)

## 2018-03-03 LAB — T4, FREE: FREE T4: 0.91 ng/dL (ref 0.60–1.60)

## 2018-03-03 LAB — FOLLICLE STIMULATING HORMONE: FSH: 7.4 m[IU]/mL

## 2018-03-03 LAB — TSH: TSH: 2.02 u[IU]/mL (ref 0.35–4.50)

## 2018-03-03 LAB — LUTEINIZING HORMONE: LH: 7.84 m[IU]/mL

## 2018-03-03 LAB — HEMOGLOBIN A1C: Hgb A1c MFr Bld: 5.9 % (ref 4.6–6.5)

## 2018-03-07 ENCOUNTER — Telehealth: Payer: Self-pay | Admitting: Certified Nurse Midwife

## 2018-03-07 LAB — PROLACTIN: Prolactin: 10.4 ng/mL

## 2018-03-07 LAB — TESTOSTERONE, FREE: TESTOSTERONE FREE: 27.3 pg/mL — AB (ref 0.2–5.0)

## 2018-03-07 NOTE — Addendum Note (Signed)
Addended by: Milford Cage on: 03/07/2018 10:41 AM   Modules accepted: Orders

## 2018-03-07 NOTE — Telephone Encounter (Signed)
Called and left a message for patient to call back to schedule a new patient doctor referral appointment with our office to see any provider to establish care and pap smear.

## 2018-03-24 ENCOUNTER — Encounter: Payer: Self-pay | Admitting: Obstetrics and Gynecology

## 2018-03-24 ENCOUNTER — Other Ambulatory Visit (HOSPITAL_COMMUNITY)
Admission: RE | Admit: 2018-03-24 | Discharge: 2018-03-24 | Disposition: A | Discharge status: Home / Self Care | Payer: BLUE CROSS/BLUE SHIELD | Source: Ambulatory Visit | Attending: Obstetrics and Gynecology | Admitting: Obstetrics and Gynecology

## 2018-03-24 ENCOUNTER — Ambulatory Visit (INDEPENDENT_AMBULATORY_CARE_PROVIDER_SITE_OTHER): Payer: BLUE CROSS/BLUE SHIELD | Admitting: Obstetrics and Gynecology

## 2018-03-24 ENCOUNTER — Other Ambulatory Visit: Payer: Self-pay

## 2018-03-24 VITALS — BP 124/88 | HR 68 | Ht 63.78 in | Wt 267.4 lb

## 2018-03-24 DIAGNOSIS — N912 Amenorrhea, unspecified: Secondary | ICD-10-CM | POA: Diagnosis not present

## 2018-03-24 DIAGNOSIS — Z124 Encounter for screening for malignant neoplasm of cervix: Secondary | ICD-10-CM

## 2018-03-24 DIAGNOSIS — L680 Hirsutism: Secondary | ICD-10-CM | POA: Insufficient documentation

## 2018-03-24 DIAGNOSIS — Z01419 Encounter for gynecological examination (general) (routine) without abnormal findings: Secondary | ICD-10-CM | POA: Diagnosis not present

## 2018-03-24 DIAGNOSIS — N760 Acute vaginitis: Secondary | ICD-10-CM | POA: Diagnosis not present

## 2018-03-24 DIAGNOSIS — R7989 Other specified abnormal findings of blood chemistry: Secondary | ICD-10-CM

## 2018-03-24 DIAGNOSIS — Z6841 Body Mass Index (BMI) 40.0 and over, adult: Secondary | ICD-10-CM | POA: Diagnosis not present

## 2018-03-24 MED ORDER — MEDROXYPROGESTERONE ACETATE 10 MG PO TABS: 10.0000 mg | ORAL_TABLET | Freq: Every day | ORAL | 0 refills | Status: DC
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
## 2018-03-24 NOTE — Patient Instructions (Addendum)
EXERCISE AND DIET:  We recommended that you start or continue a regular exercise program for good health. Regular exercise means any activity that makes your heart beat faster and makes you sweat.  We recommend exercising at least 30 minutes per day at least 3 days a week, preferably 4 or 5.  We also recommend a diet low in fat and sugar.  Inactivity, poor dietary choices and obesity can cause diabetes, heart attack, stroke, and kidney damage, among others.    ALCOHOL AND SMOKING:  Women should limit their alcohol intake to no more than 7 drinks/beers/glasses of wine (combined, not each!) per week. Moderation of alcohol intake to this level decreases your risk of breast cancer and liver damage. And of course, no recreational drugs are part of a healthy lifestyle.  And absolutely no smoking or even second hand smoke. Most people know smoking can cause heart and lung diseases, but did you know it also contributes to weakening of your bones? Aging of your skin?  Yellowing of your teeth and nails?  CALCIUM AND VITAMIN D:  Adequate intake of calcium and Vitamin D are recommended.  The recommendations for exact amounts of these supplements seem to change often, but generally speaking 600 mg of calcium (either carbonate or citrate) and 800 units of Vitamin D per day seems prudent. Certain women may benefit from higher intake of Vitamin D.  If you are among these women, your doctor will have told you during your visit.    PAP SMEARS:  Pap smears, to check for cervical cancer or precancers,  have traditionally been done yearly, although recent scientific advances have shown that most women can have pap smears less often.  However, every woman still should have a physical exam from her gynecologist every year. It will include a breast check, inspection of the vulva and vagina to check for abnormal growths or skin changes, a visual exam of the cervix, and then an exam to evaluate the size and shape of the uterus and  ovaries.  And after 24 years of age, a rectal exam is indicated to check for rectal cancers. We will also provide age appropriate advice regarding health maintenance, like when you should have certain vaccines, screening for sexually transmitted diseases, bone density testing, colonoscopy, mammograms, etc.   MAMMOGRAMS:  All women over 40 years old should have a yearly mammogram. Many facilities now offer a "3D" mammogram, which may cost around $50 extra out of pocket. If possible,  we recommend you accept the option to have the 3D mammogram performed.  It both reduces the number of women who will be called back for extra views which then turn out to be normal, and it is better than the routine mammogram at detecting truly abnormal areas.    COLONOSCOPY:  Colonoscopy to screen for colon cancer is recommended for all women at age 50.  We know, you hate the idea of the prep.  We agree, BUT, having colon cancer and not knowing it is worse!!  Colon cancer so often starts as a polyp that can be seen and removed at colonscopy, which can quite literally save your life!  And if your first colonoscopy is normal and you have no family history of colon cancer, most women don't have to have it again for 10 years.  Once every ten years, you can do something that may end up saving your life, right?  We will be happy to help you get it scheduled when you are ready.    Be sure to check your insurance coverage so you understand how much it will cost.  It may be covered as a preventative service at no cost, but you should check your particular policy.     Acanthosis Nigricans Acanthosis nigricans is a disorder in which dark, velvety markings appear on the skin. What are the causes? This condition may be caused by:  A hormonal or glandular disorder, such as diabetes.  Obesity.  Certain medicines, such as birth control pills.  A tumor. (This is rare.)  Some people inherit the condition from their parents. What  increases the risk? This condition is more likely to develop in:  People who have a hormonal or glandular disorder.  People who are overweight.  People who take certain medicines.  People who have certain cancers, especially stomach cancer.  People who have dark-colored skin (dark complexion).  What are the signs or symptoms? The main symptom of this condition is velvety markings on the skin that are light brown, black, or grayish in color. The markings usually appear on the face, neck, armpits, inner thighs, and groin. In severe cases, markings may also appear on the lips, hands, breasts, eyelids, and mouth. How is this diagnosed? This condition may be diagnosed based on symptoms. Sometimes, a skin sample is taken for testing (skin biopsy). You may also have tests to help determine the cause of the condition. How is this treated? Treatment for this condition depends on the cause. Treatment may involve reducing insulin levels, which are often high in people who have this condition. Insulin levels can be reduced with:  Dietary changes, such as avoiding starchy foods and sugars.  Losing weight.  Medicines.  Sometimes, treatment involves:  Medicines to improve the appearance of the skin.  Laser treatment to improve the appearance of the skin.  Surgical removal of the skin markings (dermabrasion).  Follow these instructions at home:  Follow diet instructions from your health care provider.  Lose weight if you are overweight.  Take over-the-counter and prescription medicines only as told by your health care provider.  Keep all follow-up visits as told by your health care provider. This is important. Contact a health care provider if:  The skin markings do not go away with treatment.  New skin markings develop on a part of the body where they rarely develop, such as on your lips, hands, breasts, eyelids, or mouth.  The condition recurs for an unknown reason. This  information is not intended to replace advice given to you by your health care provider. Make sure you discuss any questions you have with your health care provider. Document Released: 05/25/2005 Document Revised: 10/31/2015 Document Reviewed: 07/19/2014 Elsevier Interactive Patient Education  2018 [employer].  Polycystic Ovarian Syndrome Polycystic ovarian syndrome (PCOS) is a common hormonal disorder among women of reproductive age. In most women with PCOS, many small fluid-filled sacs (cysts) grow on the ovaries, and the cysts are not part of a normal menstrual cycle. PCOS can cause problems with your menstrual periods and make it difficult to get pregnant. It can also cause an increased risk of miscarriage with pregnancy. If it is not treated, PCOS can lead to serious health problems, such as diabetes and heart disease. What are the causes? The cause of PCOS is not known, but it may be the result of a combination of certain factors, such as:  Irregular menstrual cycle.  High levels of certain hormones (androgens).  Problems with the hormone that helps to control blood sugar (  insulin resistance).  Certain genes.  What increases the risk? This condition is more likely to develop in women who have a family history of PCOS. What are the signs or symptoms? Symptoms of PCOS may include:  Multiple ovarian cysts.  Infrequent periods or no periods.  Periods that are too frequent or too heavy.  Unpredictable periods.  Inability to get pregnant (infertility) because of not ovulating.  Increased growth of hair on the face, chest, stomach, back, thumbs, thighs, or toes.  Acne or oily skin. Acne may develop during adulthood, and it may not respond to treatment.  Pelvic pain.  Weight gain or obesity.  Patches of thickened and dark brown or black skin on the neck, arms, breasts, or thighs (acanthosis nigricans).  Excess hair growth on the face, chest, abdomen, or upper thighs  (hirsutism).  How is this diagnosed? This condition is diagnosed based on:  Your medical history.  A physical exam, including a pelvic exam. Your health care provider may look for areas of increased hair growth on your skin.  Tests, such as: ? Ultrasound. This may be used to examine the ovaries and the lining of the uterus (endometrium) for cysts. ? Blood tests. These may be used to check levels of sugar (glucose), female hormone (testosterone), and female hormones (estrogen and progesterone) in your blood.  How is this treated? There is no cure for PCOS, but treatment can help to manage symptoms and prevent more health problems from developing. Treatment varies depending on:  Your symptoms.  Whether you want to have a baby or whether you need birth control (contraception).  Treatment may include nutrition and lifestyle changes along with:  Progesterone hormone to start a menstrual period.  Birth control pills to help you have regular menstrual periods.  Medicines to make you ovulate, if you want to get pregnant.  Medicine to reduce excessive hair growth.  Surgery, in severe cases. This may involve making small holes in one or both of your ovaries. This decreases the amount of testosterone that your body produces.  Follow these instructions at home:  Take over-the-counter and prescription medicines only as told by your health care provider.  Follow a healthy meal plan. This can help you reduce the effects of PCOS. ? Eat a healthy diet that includes lean proteins, complex carbohydrates, fresh fruits and vegetables, low-fat dairy products, and healthy fats. Make sure to eat enough fiber.  If you are overweight, lose weight as told by your health care provider. ? Losing 10% of your body weight may improve symptoms. ? Your health care provider can determine how much weight loss is best for you and can help you lose weight safely.  Keep all follow-up visits as told by your health  care provider. This is important. Contact a health care provider if:  Your symptoms do not get better with medicine.  You develop new symptoms. This information is not intended to replace advice given to you by your health care provider. Make sure you discuss any questions you have with your health care provider. Document Released: 09/18/2004 Document Revised: 01/21/2016 Document Reviewed: 11/10/2015 Elsevier Interactive Patient Education  2018 [place] Breast self-awareness means being familiar with how your breasts look and feel. It involves checking your breasts regularly and reporting any changes to your health care provider. Practicing breast self-awareness is important. A change in your breasts can be a sign of a serious medical problem. Being familiar with how your breasts look and feel allows  you to find any problems early, when treatment is more likely to be successful. All women should practice breast self-awareness, including women who have had breast implants. How to do a breast self-exam One way to learn what is normal for your breasts and whether your breasts are changing is to do a breast self-exam. To do a breast self-exam: Look for Changes  1. Remove all the clothing above your waist. 2. Stand in front of a mirror in a room with good lighting. 3. Put your hands on your hips. 4. Push your hands firmly downward. 5. Compare your breasts in the mirror. Look for differences between them (asymmetry), such as: ? Differences in shape. ? Differences in size. ? Puckers, dips, and bumps in one breast and not the other. 6. Look at each breast for changes in your skin, such as: ? Redness. ? Scaly areas. 7. Look for changes in your nipples, such as: ? Discharge. ? Bleeding. ? Dimpling. ? Redness. ? A change in position. Feel for Changes  Carefully feel your breasts for lumps and changes. It is best to do this while lying on your back on the floor  and again while sitting or standing in the shower or tub with soapy water on your skin. Feel each breast in the following way:  Place the arm on the side of the breast you are examining above your head.  Feel your breast with the other hand.  Start in the nipple area and make  inch (2 cm) overlapping circles to feel your breast. Use the pads of your three middle fingers to do this. Apply light pressure, then medium pressure, then firm pressure. The light pressure will allow you to feel the tissue closest to the skin. The medium pressure will allow you to feel the tissue that is a little deeper. The firm pressure will allow you to feel the tissue close to the ribs.  Continue the overlapping circles, moving downward over the breast until you feel your ribs below your breast.  Move one finger-width toward the center of the body. Continue to use the  inch (2 cm) overlapping circles to feel your breast as you move slowly up toward your collarbone.  Continue the up and down exam using all three pressures until you reach your armpit.  Write Down What You Find  Write down what is normal for each breast and any changes that you find. Keep a written record with breast changes or normal findings for each breast. By writing this information down, you do not need to depend only on memory for size, tenderness, or location. Write down where you are in your menstrual cycle, if you are still menstruating. If you are having trouble noticing differences in your breasts, do not get discouraged. With time you will become more familiar with the variations in your breasts and more comfortable with the exam. How often should I examine my breasts? Examine your breasts every month. If you are breastfeeding, the best time to examine your breasts is after a feeding or after using a breast pump. If you menstruate, the best time to examine your breasts is 5-7 days after your period is over. During your period, your breasts  are lumpier, and it may be more difficult to notice changes. When should I see my health care provider? See your health care provider if you notice:  A change in shape or size of your breasts or nipples.  A change in the skin of your breast or  nipples, such as a reddened or scaly area.  Unusual discharge from your nipples.  A lump or thick area that was not there before.  Pain in your breasts.  Anything that concerns you.  This information is not intended to replace advice given to you by your health care provider. Make sure you discuss any questions you have with your health care provider. Document Released: 05/25/2005 Document Revised: 10/31/2015 Document Reviewed: 04/14/2015 Elsevier Interactive Patient Education  [employer].

## 2018-03-25 LAB — CYTOLOGY - PAP
Bacterial vaginitis: NEGATIVE
CANDIDA VAGINITIS: NEGATIVE
Diagnosis: NEGATIVE
Trichomonas: NEGATIVE

## 2018-03-26 LAB — DHEA-SULFATE: DHEA SO4: 184.8 ug/dL (ref 110.0–431.7)

## 2018-03-26 LAB — 17-HYDROXYPROGESTERONE: 17 HYDROXYPROGESTERONE: 39 ng/dL

## 2018-03-26 LAB — ESTRADIOL: ESTRADIOL: 19.3 pg/mL

## 2018-03-28 ENCOUNTER — Telehealth: Payer: Self-pay | Admitting: Obstetrics and Gynecology

## 2018-03-28 NOTE — Telephone Encounter (Signed)
Patient returned call. Spoke with patient regarding benefit for recommended ultrasound. Patient understood and agreeable. Patient ready to schedule.. Patient scheduled 04/12/18 with Dr Talbert Nan. Patient declined earlier appointment dates. Patient aware of appointment date, arrival time and cancellation policy. No further questions. Will close encounter

## 2018-03-28 NOTE — Telephone Encounter (Signed)
Call placed to patient to convey benefits and schedule recommended ultrasound. Left voicemail message requesting a return call

## 2018-04-11 ENCOUNTER — Telehealth: Payer: Self-pay | Admitting: Obstetrics and Gynecology

## 2018-04-11 NOTE — Telephone Encounter (Signed)
Left message to call back.  Pt should keep appointment for ultrasound.

## 2018-04-11 NOTE — Telephone Encounter (Signed)
Spoke with patient about importance of keeping appointment for ultrasound tomorrow. Can reschedule if she does start her cycle and does not feel comfortable with having ultrasound while on her cycle.  Pt states she does not want to have late cancellation fee applied.  Advised if she starts her cycle and feels she cannot keep appointment will cancel appointment with no late cancellation fee. Pt will call with update tomorrow, otherwise will keep her appointment as scheduled.   Encounter closed.

## 2018-04-11 NOTE — Telephone Encounter (Signed)
Patient is scheduled for an ultrasound tomorrow and is cramping like her cycle is about to come on. Should she still keep appointment if her period is on?

## 2018-04-11 NOTE — Progress Notes (Signed)
GYNECOLOGY  VISIT   HPI: 22 y.o.   Married Other or two or more races Hispanic or Latino  female   [id number] with No LMP recorded. (Menstrual status: Irregular Periods).   here for consult following PUS.  The patient has a h/o PCOS and 4 years of amenorrhea. Labs with slightly elevated testosterone, normal FSH and low estradiol levels. HgbA1C of 5.9, normal prolactin, negative UPT, normal TSH. She took provera, ended a week ago and hasn't had any bleeding.  Married, would like to get pregnant, unprotected intercourse for years.    GYNECOLOGIC HISTORY: No LMP recorded. (Menstrual status: Irregular Periods). Contraception:None Menopausal hormone therapy: None        OB History    Gravida  0   Para  0   Term  0   Preterm  0   AB  0   Living  0     SAB  0   TAB  0   Ectopic  0   Multiple  0   Live Births  0              Patient Active Problem List   Diagnosis Date Noted  . Hypothyroidism 03/02/2018  . Anovulation 03/02/2018    Past Medical History:  Diagnosis Date  . Amenorrhea   . Dysmenorrhea   . Hormone disorder     Past Surgical History:  Procedure Laterality Date  . APPENDECTOMY    . CARPAL TUNNEL RELEASE    . CATARACT EXTRACTION, BILATERAL    . TONSILLECTOMY      No current outpatient medications on file.   No current facility-administered medications for this visit.      ALLERGIES: Amoxicillin and Penicillins  Family History  Problem Relation Age of Onset  . Depression Brother   . Drug abuse Brother   . Heart disease Maternal Grandmother   . Hypertension Maternal Grandmother   . Hypertension Paternal Grandmother   . Depression Paternal Grandmother     Social History   Socioeconomic History  . Marital status: Married    Spouse name: Not on file  . Number of children: Not on file  . Years of education: Not on file  . Highest education level: Not on file  Occupational History  . Not on file  Social Needs  . Financial resource  strain: Not on file  . Food insecurity:    Worry: Not on file    Inability: Not on file  . Transportation needs:    Medical: Not on file    Non-medical: Not on file  Tobacco Use  . Smoking status: Never Smoker  . Smokeless tobacco: Never Used  Substance and Sexual Activity  . Alcohol use: No  . Drug use: No  . Sexual activity: Yes    Birth control/protection: None  Lifestyle  . Physical activity:    Days per week: Not on file    Minutes per session: Not on file  . Stress: Not on file  Relationships  . Social connections:    Talks on phone: Not on file    Gets together: Not on file    Attends religious service: Not on file    Active member of club or organization: Not on file    Attends meetings of clubs or organizations: Not on file    Relationship status: Not on file  . Intimate partner violence:    Fear of current or ex partner: Not on file    Emotionally abused: Not on file  Physically abused: Not on file    Forced sexual activity: Not on file  Other Topics Concern  . Not on file  Social History Narrative  . Not on file    Review of Systems  Constitutional: Negative.   HENT: Negative.   Eyes: Negative.   Respiratory: Negative.   Cardiovascular: Negative.   Gastrointestinal: Negative.   Genitourinary: Negative.   Musculoskeletal: Negative.   Skin: Negative.   Neurological: Negative.   Endo/Heme/Allergies: Negative.   Psychiatric/Behavioral: Negative.     PHYSICAL EXAMINATION:    BP 126/84 (BP Location: Right Arm, Patient Position: Sitting, Cuff Size: Large)   Pulse 76   Wt 267 lb (121.1 kg)   BMI 46.15 kg/m     General appearance: alert, cooperative and appears stated age  ASSESSMENT Amenorrhea, large endometrial polyp, otherwise thin endometrium, atrophic appearing vagina (on last exam) normal FSH, low estradiol level. No W/D bleed with provera PCOS    PLAN Brain MRI to r/o pituitary mass Hysteroscopy, polypectomy, D&C   An After Visit  Summary was printed and given to the patient.

## 2018-04-12 ENCOUNTER — Ambulatory Visit (INDEPENDENT_AMBULATORY_CARE_PROVIDER_SITE_OTHER): Payer: BLUE CROSS/BLUE SHIELD | Admitting: Obstetrics and Gynecology

## 2018-04-12 ENCOUNTER — Other Ambulatory Visit: Payer: BLUE CROSS/BLUE SHIELD

## 2018-04-12 ENCOUNTER — Other Ambulatory Visit: Payer: Self-pay

## 2018-04-12 ENCOUNTER — Ambulatory Visit (INDEPENDENT_AMBULATORY_CARE_PROVIDER_SITE_OTHER): Payer: BLUE CROSS/BLUE SHIELD

## 2018-04-12 ENCOUNTER — Encounter: Payer: Self-pay | Admitting: Obstetrics and Gynecology

## 2018-04-12 VITALS — BP 126/84 | HR 76 | Wt 267.0 lb

## 2018-04-12 DIAGNOSIS — L680 Hirsutism: Secondary | ICD-10-CM

## 2018-04-12 DIAGNOSIS — Z6841 Body Mass Index (BMI) 40.0 and over, adult: Secondary | ICD-10-CM

## 2018-04-12 DIAGNOSIS — N911 Secondary amenorrhea: Secondary | ICD-10-CM

## 2018-04-12 DIAGNOSIS — R7989 Other specified abnormal findings of blood chemistry: Secondary | ICD-10-CM | POA: Diagnosis not present

## 2018-04-12 DIAGNOSIS — N912 Amenorrhea, unspecified: Secondary | ICD-10-CM

## 2018-04-12 DIAGNOSIS — N840 Polyp of corpus uteri: Secondary | ICD-10-CM | POA: Diagnosis not present

## 2018-04-12 DIAGNOSIS — E282 Polycystic ovarian syndrome: Secondary | ICD-10-CM

## 2018-04-13 ENCOUNTER — Telehealth: Payer: Self-pay | Admitting: Obstetrics and Gynecology

## 2018-04-13 ENCOUNTER — Other Ambulatory Visit: Payer: Self-pay | Admitting: *Deleted

## 2018-04-13 DIAGNOSIS — E2839 Other primary ovarian failure: Secondary | ICD-10-CM

## 2018-04-13 DIAGNOSIS — N912 Amenorrhea, unspecified: Secondary | ICD-10-CM

## 2018-04-13 NOTE — Telephone Encounter (Signed)
Spoke with patient in regards to benefits for surgery. Patient understood information presented, Patient has requested to defer scheduling surgery and MRI to January.   Forwarding to Lamont Snowball, RN

## 2018-05-19 ENCOUNTER — Emergency Department (HOSPITAL_COMMUNITY)
Admission: EM | Admit: 2018-05-19 | Discharge: 2018-05-19 | Disposition: A | Discharge status: Home / Self Care | Payer: BLUE CROSS/BLUE SHIELD | Attending: Emergency Medicine | Admitting: Emergency Medicine

## 2018-05-19 DIAGNOSIS — H6593 Unspecified nonsuppurative otitis media, bilateral: Secondary | ICD-10-CM | POA: Diagnosis not present

## 2018-05-19 DIAGNOSIS — H9203 Otalgia, bilateral: Secondary | ICD-10-CM | POA: Diagnosis not present

## 2018-05-19 DIAGNOSIS — H65193 Other acute nonsuppurative otitis media, bilateral: Secondary | ICD-10-CM | POA: Diagnosis not present

## 2018-05-19 MED ORDER — CEFDINIR 300 MG PO CAPS
300.0000 mg | ORAL_CAPSULE | Freq: Once | ORAL | Status: AC
  Administered 2018-05-19: 300 mg via ORAL
  Filled 2018-05-19: qty 1

## 2018-05-19 MED ORDER — CEFDINIR 300 MG PO CAPS: 300.0000 mg | ORAL_CAPSULE | Freq: Two times a day (BID) | ORAL | 0 refills | Status: DC

## 2018-05-19 MED ORDER — IBUPROFEN 800 MG PO TABS
800.0000 mg | ORAL_TABLET | Freq: Once | ORAL | Status: AC
  Administered 2018-05-19: 800 mg via ORAL
  Filled 2018-05-19: qty 1

## 2018-05-19 NOTE — ED Notes (Signed)
PT states understanding of care given, follow up care, and medication prescribed. PT ambulated from ED to car with a steady gait. 

## 2018-05-19 NOTE — ED Notes (Signed)
See downtime

## 2018-05-19 NOTE — ED Provider Notes (Signed)
TIME SEEN: 3:45 AM  CHIEF COMPLAINT: Ear pain  HPI: Patient is a 22 year old female with history of obesity who presents to the emergency department with bilateral ear pain, worse on the left side.  States symptoms started 4 days ago where she had nasal congestion, sore throat.  No known fever.  States started having ear pain around 8 PM last night which has progressively gotten worse and is a throbbing pain.  She is not a diabetic.  No hearing loss.  No vomiting or diarrhea.  Took Tylenol at home without relief.  ROS: See HPI Constitutional: no fever  Eyes: no drainage  ENT: no runny nose   Cardiovascular:  no chest pain  Resp: no SOB  GI: no vomiting GU: no dysuria Integumentary: no rash  Allergy: no hives  Musculoskeletal: no leg swelling  Neurological: no slurred speech ROS otherwise negative  PAST MEDICAL HISTORY/PAST SURGICAL HISTORY:  Past Medical History:  Diagnosis Date  . Amenorrhea   . Dysmenorrhea   . Hormone disorder     MEDICATIONS:  Prior to Admission medications   Not on File    ALLERGIES:  Allergies  Allergen Reactions  . Amoxicillin Diarrhea, Nausea And Vomiting and Other (See Comments)    dehydration  . Penicillins Diarrhea, Nausea And Vomiting and Other (See Comments)    Dehydration Has patient had a PCN reaction causing immediate rash, facial/tongue/throat swelling, SOB or lightheadedness with hypotension: No Has patient had a PCN reaction causing severe rash involving mucus membranes or skin necrosis: No Has patient had a PCN reaction that required hospitalization: Yes Has patient had a PCN reaction occurring within the last 10 years: Yes If all of the above answers are "NO", then may proceed with Cephalosporin use.    SOCIAL HISTORY:  Social History   Tobacco Use  . Smoking status: Never Smoker  . Smokeless tobacco: Never Used  Substance Use Topics  . Alcohol use: No    FAMILY HISTORY: Family History  Problem Relation Age of Onset  .  Depression Brother   . Drug abuse Brother   . Heart disease Maternal Grandmother   . Hypertension Maternal Grandmother   . Hypertension Paternal Grandmother   . Depression Paternal Grandmother     EXAM: BP 134/88 (BP Location: Right Arm)   Pulse 74   Temp 98 F (36.7 C) (Oral)   Resp 18   SpO2 100%  CONSTITUTIONAL: Alert and oriented and responds appropriately to questions. Well-appearing; well-nourished HEAD: Normocephalic EYES: Conjunctivae clear, pupils appear equal, EOMI ENT: normal nose; moist mucous membranes; No pharyngeal erythema or petechiae, no tonsillar hypertrophy or exudate, no uvular deviation, no unilateral swelling, no trismus or drooling, no muffled voice, normal phonation, no stridor, no dental caries present, no drainable dental abscess noted, no Ludwig's angina, tongue sits flat in the bottom of the mouth, no angioedema, no facial erythema or warmth, no facial swelling; no pain with movement of the neck.  TMs are inflamed with erythema, purulent appearing effusion, no perforation or drainage.  TMs are bulging bilaterally, worse on the left side.  No cerumen impaction or sign of foreign body in the external auditory canal. No inflammation, erythema or drainage from the external auditory canal. No signs of mastoiditis. No pain with manipulation of the pinna bilaterally. NECK: Supple, no meningismus, no nuchal rigidity, no LAD  CARD: RRR; S1 and S2 appreciated; no murmurs, no clicks, no rubs, no gallops RESP: Normal chest excursion without splinting or tachypnea; breath sounds clear and equal  bilaterally; no wheezes, no rhonchi, no rales, no hypoxia or respiratory distress, speaking full sentences ABD/GI: Normal bowel sounds; non-distended; soft, non-tender, no rebound, no guarding, no peritoneal signs, no hepatosplenomegaly BACK:  The back appears normal and is non-tender to palpation, there is no CVA tenderness EXT: Normal ROM in all joints; non-tender to palpation; no  edema; normal capillary refill; no cyanosis, no calf tenderness or swelling    SKIN: Normal color for age and race; warm; no rash NEURO: Moves all extremities equally PSYCH: The patient's mood and manner are appropriate. Grooming and personal hygiene are appropriate.  MEDICAL DECISION MAKING: Patient here with bilateral otitis media.  We will start her on cefdinir as she is allergic to penicillins.  Recommended alternating Tylenol and Motrin.  No signs of otitis externa, mastoiditis.  At this time, I do not feel there is any life-threatening condition present. I have reviewed and discussed all results (EKG, imaging, lab, urine as appropriate) and exam findings with patient/family. I have reviewed nursing notes and appropriate previous records.  I feel the patient is safe to be discharged home without further emergent workup and can continue workup as an outpatient as needed. Discussed usual and customary return precautions. Patient/family verbalize understanding and are comfortable with this plan.  Outpatient follow-up has been provided as needed. All questions have been answered.      Minoru Chap, Delice Bison, DO 05/19/18 [phone]

## 2018-05-19 NOTE — ED Notes (Signed)
Patient is A&Ox4.  No signs of distress noted.  Please see providers complete history and physical exam.  

## 2018-05-19 NOTE — Discharge Instructions (Signed)
You may alternate Tylenol 1000 mg every 6 hours as needed for pain and Ibuprofen 800 mg every 8 hours as needed for pain.  Please take Ibuprofen with food. ° °

## 2018-05-20 NOTE — ED Notes (Signed)
05/20/2018,  Pt. Called and left a message on this RN's office voicemail.  The message was very difficult to understand due to the phone connection and static.   Called pt. Back and left a message for a return call if she still has questions.

## 2018-06-14 ENCOUNTER — Telehealth: Payer: Self-pay | Admitting: Obstetrics and Gynecology

## 2018-06-14 NOTE — Telephone Encounter (Signed)
See previous phone note. Call placed to patient to follow up regarding surgery planning for 2020. Patient answered the phone, but advises she is at work and can't take personal calls at work. Patient advises she will call back.   cc: Lamont Snowball, RN

## 2018-06-14 NOTE — Telephone Encounter (Signed)
Patient returned call

## 2018-06-15 NOTE — Telephone Encounter (Signed)
See new phone message in January regarding attempts to schedule surgery.   Encounter closed.

## 2018-07-25 ENCOUNTER — Telehealth: Payer: Self-pay | Admitting: Obstetrics and Gynecology

## 2018-07-25 NOTE — Telephone Encounter (Signed)
Patient returned call. Patient has scheduled MRI on 07/31/2018 with [facility]. Patient is also scheduled for follow up appointment with Dr Talbert Nan on 08/03/2018.   cc: Lamont Snowball, RN

## 2018-07-25 NOTE — Telephone Encounter (Signed)
Patient called and stated that she was ready to move forward with scheduling surgery and MRI. See prior phone notes.  Cc: Gay Filler, RN, [facility].

## 2018-07-25 NOTE — Telephone Encounter (Signed)
See next encounter.

## 2018-07-25 NOTE — Telephone Encounter (Signed)
Returned call to patient. Reviewed with Nurse Supervisor, advised patient to contact [facility] at [phone],  to schedule MRI, as the order is in the system and remains a valid order.  Patient is agreeable to contacting [facility] for scheduling.  Once MRI appointment is scheduled, patient will call back to our office to schedule an appointment with Dr Talbert Nan to follow up and review possible surgery plan.  Forwarding to Lamont Snowball, RN

## 2018-07-30 ENCOUNTER — Ambulatory Visit
Admission: RE | Admit: 2018-07-30 | Discharge: 2018-07-30 | Disposition: A | Discharge status: Home / Self Care | Payer: BLUE CROSS/BLUE SHIELD | Source: Ambulatory Visit | Attending: Obstetrics and Gynecology | Admitting: Obstetrics and Gynecology

## 2018-07-30 DIAGNOSIS — E2839 Other primary ovarian failure: Secondary | ICD-10-CM

## 2018-07-30 DIAGNOSIS — N912 Amenorrhea, unspecified: Secondary | ICD-10-CM

## 2018-07-30 DIAGNOSIS — D497 Neoplasm of unspecified behavior of endocrine glands and other parts of nervous system: Secondary | ICD-10-CM | POA: Diagnosis not present

## 2018-07-30 MED ORDER — GADOBENATE DIMEGLUMINE 529 MG/ML IV SOLN
10.0000 mL | Freq: Once | INTRAVENOUS | Status: AC | PRN
  Administered 2018-07-30: 10 mL via INTRAVENOUS

## 2018-07-31 ENCOUNTER — Other Ambulatory Visit: Payer: BLUE CROSS/BLUE SHIELD

## 2018-08-01 NOTE — Progress Notes (Signed)
GYNECOLOGY  VISIT   HPI: 23 y.o.   Married Other or two or more races Hispanic or Latino  female   [id number] with No LMP recorded. (Menstrual status: Irregular Periods).   here to discuss surgery options and MRI results.  She has a 4 year h/o amenorrhea, hirsutism and obesity. Normal TSH, prolactin, FSH 7.4, estradiol of 19.3, total testosterone of 89, free testosterone 27,normal DHEAS and 17 hydroxyprogesterone.  No bleeding with progesterone w/d. Ultrasound with endometrial polyp, and PCOS appearing ovaries, no masses. Uterine lining otherwise thin appearing.  GYNECOLOGIC HISTORY: No LMP recorded. (Menstrual status: Irregular Periods). Contraception: None Menopausal hormone therapy: None        OB History    Gravida  0   Para  0   Term  0   Preterm  0   AB  0   Living  0     SAB  0   TAB  0   Ectopic  0   Multiple  0   Live Births  0              Patient Active Problem List   Diagnosis Date Noted  . Hypothyroidism 03/02/2018  . Anovulation 03/02/2018    Past Medical History:  Diagnosis Date  . Amenorrhea   . Dysmenorrhea   . Hormone disorder     Past Surgical History:  Procedure Laterality Date  . APPENDECTOMY    . CARPAL TUNNEL RELEASE    . CATARACT EXTRACTION, BILATERAL    . TONSILLECTOMY      No current outpatient medications on file.   No current facility-administered medications for this visit.      ALLERGIES: Amoxicillin and Penicillins  Family History  Problem Relation Age of Onset  . Depression Brother   . Drug abuse Brother   . Heart disease Maternal Grandmother   . Hypertension Maternal Grandmother   . Hypertension Paternal Grandmother   . Depression Paternal Grandmother     Social History   Socioeconomic History  . Marital status: Married    Spouse name: Not on file  . Number of children: Not on file  . Years of education: Not on file  . Highest education level: Not on file  Occupational History  . Not on file   Social Needs  . Financial resource strain: Not on file  . Food insecurity:    Worry: Not on file    Inability: Not on file  . Transportation needs:    Medical: Not on file    Non-medical: Not on file  Tobacco Use  . Smoking status: Never Smoker  . Smokeless tobacco: Never Used  Substance and Sexual Activity  . Alcohol use: No  . Drug use: No  . Sexual activity: Yes    Birth control/protection: None  Lifestyle  . Physical activity:    Days per week: Not on file    Minutes per session: Not on file  . Stress: Not on file  Relationships  . Social connections:    Talks on phone: Not on file    Gets together: Not on file    Attends religious service: Not on file    Active member of club or organization: Not on file    Attends meetings of clubs or organizations: Not on file    Relationship status: Not on file  . Intimate partner violence:    Fear of current or ex partner: Not on file    Emotionally abused: Not on file  Physically abused: Not on file    Forced sexual activity: Not on file  Other Topics Concern  . Not on file  Social History Narrative  . Not on file    Review of Systems  Constitutional: Negative.   HENT: Negative.   Eyes: Negative.   Respiratory: Negative.   Cardiovascular: Negative.   Gastrointestinal: Negative.   Genitourinary: Negative.   Musculoskeletal: Negative.   Skin: Negative.   Neurological: Negative.   Endo/Heme/Allergies: Negative.   Psychiatric/Behavioral: Negative.     PHYSICAL EXAMINATION:    BP (!) 142/82 (BP Location: Right Arm, Patient Position: Sitting, Cuff Size: Large)   Pulse 80   Wt 278 lb (126.1 kg)   BMI 48.05 kg/m     General appearance: alert, cooperative and appears stated age Neck: no adenopathy, supple, symmetrical, trachea midline and thyroid normal to inspection and palpation Heart: regular rate and rhythm Lungs: CTAB Abdomen: soft, non-tender; bowel sounds normal; no masses,  no organomegaly Extremities:  normal, atraumatic, no cyanosis Skin: normal color, texture and turgor, no rashes or lesions Lymph: normal cervical supraclavicular and inguinal nodes Neurologic: grossly normal  Discussed normal MRI  ASSESSMENT Hypothalamic amenorrhea, normal MRI, mildly elevated total testosterone (primary also ordered free testosterone that was more significantly elevated). Clinical history other than her low estrogen levels is c/w PCOS.  Endometrial polyp BMI 48    PLAN Referral to Dr Kerin Perna Plan: hysteroscopy, polypectomy, dilation and curettage. Reviewed risks, including: bleeding, infection, uterine perforation, fluid overload, need for further sugery    An After Visit Summary was printed and given to the patient.

## 2018-08-03 ENCOUNTER — Encounter: Payer: Self-pay | Admitting: Obstetrics and Gynecology

## 2018-08-03 ENCOUNTER — Ambulatory Visit (INDEPENDENT_AMBULATORY_CARE_PROVIDER_SITE_OTHER): Payer: BLUE CROSS/BLUE SHIELD | Admitting: Obstetrics and Gynecology

## 2018-08-03 ENCOUNTER — Other Ambulatory Visit: Payer: Self-pay

## 2018-08-03 VITALS — BP 142/82 | HR 80 | Wt 278.0 lb

## 2018-08-03 DIAGNOSIS — N970 Female infertility associated with anovulation: Secondary | ICD-10-CM | POA: Diagnosis not present

## 2018-08-03 DIAGNOSIS — N840 Polyp of corpus uteri: Secondary | ICD-10-CM | POA: Diagnosis not present

## 2018-08-03 DIAGNOSIS — Z6841 Body Mass Index (BMI) 40.0 and over, adult: Secondary | ICD-10-CM | POA: Diagnosis not present

## 2018-08-03 DIAGNOSIS — N912 Amenorrhea, unspecified: Secondary | ICD-10-CM

## 2018-08-03 NOTE — Patient Instructions (Signed)
Hysteroscopy  Hysteroscopy is a procedure that is used to examine the inside of a woman's womb (uterus). This may be done for various reasons, including:   To look for lumps (tumors) and other growths in the uterus.   To evaluate abnormal bleeding, fibroid tumors, polyps, scar tissue (adhesions), or cancer of the uterus.   To determine the cause of an inability to get pregnant (infertility) or repeated losses of pregnancies (miscarriages).   To find a lost IUD (intrauterine device).   To perform a procedure that permanently prevents pregnancy (sterilization).  During this procedure, a thin, flexible tube with a small light and camera (hysteroscope) is used to examine the uterus. The camera sends images to a monitor in the room so that your health care provider can view the inside of your uterus. A hysteroscopy should be done right after a menstrual period to make sure that you are not pregnant.  Tell a health care provider about:   Any allergies you have.   All medicines you are taking, including vitamins, herbs, eye drops, creams, and over-the-counter medicines.   Any problems you or family members have had with the use of anesthetic medicines.   Any blood disorders you have.   Any surgeries you have had.   Any medical conditions you have.   Whether you are pregnant or may be pregnant.  What are the risks?  Generally, this is a safe procedure. However, problems may occur, including:   Excessive bleeding.   Infection.   Damage to the uterus or other structures or organs.   Allergic reaction to medicines or fluids that are used in the procedure.  What happens before the procedure?  Staying hydrated  Follow instructions from your health care provider about hydration, which may include:   Up to 2 hours before the procedure - you may continue to drink clear liquids, such as water, clear fruit juice, black coffee, and plain tea.  Eating and drinking restrictions  Follow instructions from your health care  provider about eating and drinking, which may include:   8 hours before the procedure - stop eating solid foods and drink clear liquids only   2 hours before the procedure - stop drinking clear liquids.  General instructions   Ask your health care provider about:  ? Changing or stopping your normal medicines. This is important if you take diabetes medicines or blood thinners.  ? Taking medicines such as aspirin and ibuprofen. These medicines can thin your blood and cause bleeding. Do not take these medicines for 1 week before your procedure, or as told by your health care provider.   Do not use any products that contain nicotine or tobacco for 2 weeks before the procedure. This includes cigarettes and e-cigarettes. If you need help quitting, ask your health care provider.   Medicine may be placed in your cervix the day before the procedure. This medicine causes the cervix to have a larger opening (dilate). The larger opening makes it easier for the hysteroscope to be inserted into the uterus during the procedure.   Plan to have someone with you for the first 24-48 hours after the procedure, especially if you are given a medicine to make you fall asleep (general anesthetic).   Plan to have someone take you home from the hospital or clinic.  What happens during the procedure?   To lower your risk of infection:  ? Your health care team will wash or sanitize their hands.  ? Your skin will   be washed with soap.  ? Hair may be removed from the surgical area.   An IV tube will be inserted into one of your veins.   You may be given one or more of the following:  ? A medicine to help you relax (sedative).  ? A medicine that numbs the area around the cervix (local anesthetic).  ? A medicine to make you fall asleep (general anesthetic).   A hysteroscope will be inserted through your vagina and into your uterus.   Air or fluid will be used to enlarge your uterus, enabling your health care provider to see your uterus  better. The amount of fluid used will be carefully checked throughout the procedure.   In some cases, tissue may be gently scraped from inside the uterus and sent to a lab for testing (biopsy).  The procedure may vary among health care providers and hospitals.  What happens after the procedure?   Your blood pressure, heart rate, breathing rate, and blood oxygen level will be monitored until the medicines you were given have worn off.   You may have some cramping. You may be given medicines for this.   You may have bleeding, which varies from light spotting to menstrual-like bleeding. This is normal.   If you had a biopsy done, it is your responsibility to get the results of your procedure. Ask your health care provider, or the department performing the procedure, when your results will be ready.  Summary   Hysteroscopy is a procedure that is used to examine the inside of a woman's womb (uterus).   After the procedure, you may have bleeding, which varies from light spotting to menstrual-like bleeding. This is normal. You may also have cramping.   Plan to have someone take you home from the hospital or clinic.  This information is not intended to replace advice given to you by your health care provider. Make sure you discuss any questions you have with your health care provider.  Document Released: 08/31/2000 Document Revised: 06/23/2016 Document Reviewed: 06/23/2016  Elsevier Interactive Patient Education  2019 [employer]

## 2018-08-03 NOTE — Progress Notes (Signed)
Following appointment with Dr Talbert Nan, Hysteroscopy, Myosure, D&C scheduled for Monday, 08-15-2018 at 0830 at [facility]. Surgery instruction sheet reviewed and printed copy provided to patient with hospital surgery brochure.

## 2018-08-08 NOTE — H&P (Signed)
Progress Notes by [name], MD at 08/03/2018 3:15 PM  Author: [name], MD Author Type: Physician Filed: 08/03/2018 5:57 PM  Note Status: Signed Cosign: Cosign Not Required Encounter Date: 08/03/2018  Editor: [name], MD (Physician)  Prior Versions: 1. Sprague, Harley Hallmark, RN (Registered Nurse) at 08/03/2018 3:36 PM - Sign when Signing Visit    GYNECOLOGY  VISIT   HPI: 23 y.o.   Married Other or two or more races Hispanic or Latino  female   [id number] with No LMP recorded. (Menstrual status: Irregular Periods).   here to discuss surgery options and MRI results.  She has a 4 year h/o amenorrhea, hirsutism and obesity. Normal TSH, prolactin, FSH 7.4, estradiol of 19.3, total testosterone of 89, free testosterone 27,normal DHEAS and 17 hydroxyprogesterone.  No bleeding with progesterone w/d. Ultrasound with endometrial polyp, and PCOS appearing ovaries, no masses. Uterine lining otherwise thin appearing.  GYNECOLOGIC HISTORY: No LMP recorded. (Menstrual status: Irregular Periods). Contraception: None Menopausal hormone therapy: None                OB History    Gravida  0   Para  0   Term  0   Preterm  0   AB  0   Living  0     SAB  0   TAB  0   Ectopic  0   Multiple  0   Live Births  0                  Patient Active Problem List   Diagnosis Date Noted  . Hypothyroidism 03/02/2018  . Anovulation 03/02/2018        Past Medical History:  Diagnosis Date  . Amenorrhea   . Dysmenorrhea   . Hormone disorder          Past Surgical History:  Procedure Laterality Date  . APPENDECTOMY    . CARPAL TUNNEL RELEASE    . CATARACT EXTRACTION, BILATERAL    . TONSILLECTOMY      No current outpatient medications on file.   No current facility-administered medications for this visit.      ALLERGIES: Amoxicillin and Penicillins       Family History  Problem Relation Age of Onset  . Depression  Brother   . Drug abuse Brother   . Heart disease Maternal Grandmother   . Hypertension Maternal Grandmother   . Hypertension Paternal Grandmother   . Depression Paternal Grandmother     Social History        Socioeconomic History  . Marital status: Married    Spouse name: Not on file  . Number of children: Not on file  . Years of education: Not on file  . Highest education level: Not on file  Occupational History  . Not on file  Social Needs  . Financial resource strain: Not on file  . Food insecurity:    Worry: Not on file    Inability: Not on file  . Transportation needs:    Medical: Not on file    Non-medical: Not on file  Tobacco Use  . Smoking status: Never Smoker  . Smokeless tobacco: Never Used  Substance and Sexual Activity  . Alcohol use: No  . Drug use: No  . Sexual activity: Yes    Birth control/protection: None  Lifestyle  . Physical activity:    Days per week: Not on file    Minutes per session: Not on file  .  Stress: Not on file  Relationships  . Social connections:    Talks on phone: Not on file    Gets together: Not on file    Attends religious service: Not on file    Active member of club or organization: Not on file    Attends meetings of clubs or organizations: Not on file    Relationship status: Not on file  . Intimate partner violence:    Fear of current or ex partner: Not on file    Emotionally abused: Not on file    Physically abused: Not on file    Forced sexual activity: Not on file  Other Topics Concern  . Not on file  Social History Narrative  . Not on file    Review of Systems  Constitutional: Negative.   HENT: Negative.   Eyes: Negative.   Respiratory: Negative.   Cardiovascular: Negative.   Gastrointestinal: Negative.   Genitourinary: Negative.   Musculoskeletal: Negative.   Skin: Negative.   Neurological: Negative.   Endo/Heme/Allergies: Negative.    Psychiatric/Behavioral: Negative.     PHYSICAL EXAMINATION:    BP (!) 142/82 (BP Location: Right Arm, Patient Position: Sitting, Cuff Size: Large)   Pulse 80   Wt 278 lb (126.1 kg)   BMI 48.05 kg/m     General appearance: alert, cooperative and appears stated age Neck: no adenopathy, supple, symmetrical, trachea midline and thyroid normal to inspection and palpation Heart: regular rate and rhythm Lungs: CTAB Abdomen: soft, non-tender; bowel sounds normal; no masses,  no organomegaly Extremities: normal, atraumatic, no cyanosis Skin: normal color, texture and turgor, no rashes or lesions Lymph: normal cervical supraclavicular and inguinal nodes Neurologic: grossly normal  Discussed normal MRI  ASSESSMENT Hypothalamic amenorrhea, normal MRI, mildly elevated total testosterone (primary also ordered free testosterone that was more significantly elevated). Clinical history other than her low estrogen levels is c/w PCOS.  Endometrial polyp BMI 48    PLAN Referral to Dr Kerin Perna Plan: hysteroscopy, polypectomy, dilation and curettage. Reviewed risks, including: bleeding, infection, uterine perforation, fluid overload, need for further sugery    An After Visit Summary was printed and given to the patient.

## 2018-08-10 ENCOUNTER — Other Ambulatory Visit: Payer: Self-pay

## 2018-08-10 ENCOUNTER — Encounter (HOSPITAL_BASED_OUTPATIENT_CLINIC_OR_DEPARTMENT_OTHER): Payer: Self-pay | Admitting: *Deleted

## 2018-08-10 NOTE — Progress Notes (Addendum)
Spoke with Melissa Riggs after midnight food, clear liquids until520 am and drink pre surgery shake at 520 am then Riggs, arrive 620 am 08-15-18 [facility] No medications to take Has surgery orders in epic Has pat appointment for bmi 49.38 on 08-12-18 and to pick up pre surgery drink Needs urine pregnancy day of surgery Driver spouse Melissa Riggs driver

## 2018-08-12 ENCOUNTER — Encounter (HOSPITAL_COMMUNITY)
Admission: RE | Admit: 2018-08-12 | Discharge: 2018-08-12 | Disposition: A | Discharge status: Home / Self Care | Payer: BLUE CROSS/BLUE SHIELD | Source: Ambulatory Visit | Attending: Obstetrics and Gynecology | Admitting: Obstetrics and Gynecology

## 2018-08-12 NOTE — Progress Notes (Signed)
Spoke with dr singer anesthesia and anesthesia will see patient am of surgery for evaluation

## 2018-08-15 ENCOUNTER — Ambulatory Visit (HOSPITAL_BASED_OUTPATIENT_CLINIC_OR_DEPARTMENT_OTHER): Payer: BLUE CROSS/BLUE SHIELD | Admitting: Anesthesiology

## 2018-08-15 ENCOUNTER — Encounter (HOSPITAL_BASED_OUTPATIENT_CLINIC_OR_DEPARTMENT_OTHER)
Admission: RE | Disposition: A | Discharge status: Home / Self Care | Payer: Self-pay | Source: Home / Self Care | Attending: Obstetrics and Gynecology

## 2018-08-15 ENCOUNTER — Other Ambulatory Visit: Payer: Self-pay

## 2018-08-15 ENCOUNTER — Ambulatory Visit (HOSPITAL_BASED_OUTPATIENT_CLINIC_OR_DEPARTMENT_OTHER)
Admission: RE | Admit: 2018-08-15 | Discharge: 2018-08-15 | Disposition: A | Discharge status: Home / Self Care | Payer: BLUE CROSS/BLUE SHIELD | Attending: Obstetrics and Gynecology | Admitting: Obstetrics and Gynecology

## 2018-08-15 DIAGNOSIS — Z880 Allergy status to penicillin: Secondary | ICD-10-CM | POA: Diagnosis not present

## 2018-08-15 DIAGNOSIS — Z8249 Family history of ischemic heart disease and other diseases of the circulatory system: Secondary | ICD-10-CM | POA: Insufficient documentation

## 2018-08-15 DIAGNOSIS — E282 Polycystic ovarian syndrome: Secondary | ICD-10-CM | POA: Insufficient documentation

## 2018-08-15 DIAGNOSIS — L680 Hirsutism: Secondary | ICD-10-CM | POA: Diagnosis not present

## 2018-08-15 DIAGNOSIS — Z9841 Cataract extraction status, right eye: Secondary | ICD-10-CM | POA: Diagnosis not present

## 2018-08-15 DIAGNOSIS — N912 Amenorrhea, unspecified: Secondary | ICD-10-CM | POA: Insufficient documentation

## 2018-08-15 DIAGNOSIS — Z818 Family history of other mental and behavioral disorders: Secondary | ICD-10-CM | POA: Insufficient documentation

## 2018-08-15 DIAGNOSIS — Z9842 Cataract extraction status, left eye: Secondary | ICD-10-CM | POA: Insufficient documentation

## 2018-08-15 DIAGNOSIS — N840 Polyp of corpus uteri: Secondary | ICD-10-CM | POA: Diagnosis not present

## 2018-08-15 DIAGNOSIS — E039 Hypothyroidism, unspecified: Secondary | ICD-10-CM | POA: Diagnosis not present

## 2018-08-15 DIAGNOSIS — Z6841 Body Mass Index (BMI) 40.0 and over, adult: Secondary | ICD-10-CM | POA: Diagnosis not present

## 2018-08-15 DIAGNOSIS — C541 Malignant neoplasm of endometrium: Secondary | ICD-10-CM | POA: Insufficient documentation

## 2018-08-15 HISTORY — PX: DILATATION & CURETTAGE/HYSTEROSCOPY WITH MYOSURE: SHX6511

## 2018-08-15 HISTORY — DX: Adverse effect of unspecified anesthetic, initial encounter: T41.45XA

## 2018-08-15 HISTORY — DX: Unspecified asthma, uncomplicated: J45.909

## 2018-08-15 HISTORY — DX: Other complications of anesthesia, initial encounter: T88.59XA

## 2018-08-15 LAB — POCT PREGNANCY, URINE: PREG TEST UR: NEGATIVE

## 2018-08-15 SURGERY — DILATATION & CURETTAGE/HYSTEROSCOPY WITH MYOSURE
Anesthesia: General | Site: Vagina

## 2018-08-15 MED ORDER — FENTANYL CITRATE (PF) 100 MCG/2ML IJ SOLN
INTRAMUSCULAR | Status: DC | PRN
  Administered 2018-08-15: 100 ug via INTRAVENOUS

## 2018-08-15 MED ORDER — FENTANYL CITRATE (PF) 100 MCG/2ML IJ SOLN
INTRAMUSCULAR | Status: AC
  Filled 2018-08-15: qty 2

## 2018-08-15 MED ORDER — ONDANSETRON HCL 4 MG/2ML IJ SOLN
INTRAMUSCULAR | Status: DC | PRN
  Administered 2018-08-15: 4 mg via INTRAVENOUS

## 2018-08-15 MED ORDER — DEXAMETHASONE SODIUM PHOSPHATE 10 MG/ML IJ SOLN
INTRAMUSCULAR | Status: AC
  Filled 2018-08-15: qty 1

## 2018-08-15 MED ORDER — IBUPROFEN 800 MG PO TABS: 800.0000 mg | ORAL_TABLET | Freq: Three times a day (TID) | ORAL | 0 refills | Status: DC | PRN

## 2018-08-15 MED ORDER — OXYCODONE HCL 5 MG PO TABS
5.0000 mg | ORAL_TABLET | Freq: Once | ORAL | Status: DC | PRN
  Filled 2018-08-15: qty 1

## 2018-08-15 MED ORDER — LACTATED RINGERS IV SOLN
INTRAVENOUS | Status: DC
  Administered 2018-08-15: 1000 mL via INTRAVENOUS
  Filled 2018-08-15: qty 1000

## 2018-08-15 MED ORDER — KETOROLAC TROMETHAMINE 30 MG/ML IJ SOLN
INTRAMUSCULAR | Status: AC
  Filled 2018-08-15: qty 1

## 2018-08-15 MED ORDER — PROPOFOL 10 MG/ML IV BOLUS
INTRAVENOUS | Status: DC | PRN
  Administered 2018-08-15: 300 mg via INTRAVENOUS

## 2018-08-15 MED ORDER — ONDANSETRON HCL 4 MG/2ML IJ SOLN
4.0000 mg | Freq: Once | INTRAMUSCULAR | Status: DC | PRN
  Filled 2018-08-15: qty 2

## 2018-08-15 MED ORDER — KETOROLAC TROMETHAMINE 30 MG/ML IJ SOLN
INTRAMUSCULAR | Status: DC | PRN
  Administered 2018-08-15: 30 mg via INTRAVENOUS

## 2018-08-15 MED ORDER — SODIUM CHLORIDE 0.9 % IR SOLN
Status: DC | PRN
  Administered 2018-08-15: 3000 mL

## 2018-08-15 MED ORDER — DEXAMETHASONE SODIUM PHOSPHATE 10 MG/ML IJ SOLN
INTRAMUSCULAR | Status: DC | PRN
  Administered 2018-08-15: 4 mg via INTRAVENOUS

## 2018-08-15 MED ORDER — PROPOFOL 500 MG/50ML IV EMUL
INTRAVENOUS | Status: AC
  Filled 2018-08-15: qty 50

## 2018-08-15 MED ORDER — LIDOCAINE HCL (CARDIAC) PF 100 MG/5ML IV SOSY
PREFILLED_SYRINGE | INTRAVENOUS | Status: DC | PRN
  Administered 2018-08-15: 100 mg via INTRAVENOUS

## 2018-08-15 MED ORDER — FENTANYL CITRATE (PF) 100 MCG/2ML IJ SOLN
25.0000 ug | INTRAMUSCULAR | Status: DC | PRN
  Administered 2018-08-15 (×2): 25 ug via INTRAVENOUS
  Filled 2018-08-15: qty 1

## 2018-08-15 MED ORDER — LIDOCAINE 2% (20 MG/ML) 5 ML SYRINGE
INTRAMUSCULAR | Status: AC
  Filled 2018-08-15: qty 5

## 2018-08-15 MED ORDER — OXYCODONE HCL 5 MG/5ML PO SOLN
5.0000 mg | Freq: Once | ORAL | Status: DC | PRN
  Filled 2018-08-15: qty 5

## 2018-08-15 MED ORDER — ONDANSETRON HCL 4 MG/2ML IJ SOLN
INTRAMUSCULAR | Status: AC
  Filled 2018-08-15: qty 2

## 2018-08-15 MED ORDER — MIDAZOLAM HCL 2 MG/2ML IJ SOLN
INTRAMUSCULAR | Status: AC
  Filled 2018-08-15: qty 2

## 2018-08-15 MED ORDER — MIDAZOLAM HCL 5 MG/5ML IJ SOLN
INTRAMUSCULAR | Status: DC | PRN
  Administered 2018-08-15: 2 mg via INTRAVENOUS

## 2018-08-15 MED ORDER — TRAMADOL HCL 50 MG PO TABS
ORAL_TABLET | ORAL | Status: AC
  Filled 2018-08-15: qty 1

## 2018-08-15 MED ORDER — TRAMADOL HCL 50 MG PO TABS
50.0000 mg | ORAL_TABLET | Freq: Once | ORAL | Status: AC
  Administered 2018-08-15: 50 mg via ORAL
  Filled 2018-08-15: qty 1

## 2018-08-15 MED ORDER — LACTATED RINGERS IV SOLN
INTRAVENOUS | Status: DC
  Administered 2018-08-15: 08:00:00 via INTRAVENOUS
  Filled 2018-08-15: qty 1000

## 2018-08-15 MED ORDER — SILVER NITRATE-POT NITRATE 75-25 % EX MISC
CUTANEOUS | Status: DC | PRN
  Administered 2018-08-15: 1

## 2018-08-15 SURGICAL SUPPLY — 21 items
CANISTER SUCT 3000ML PPV (MISCELLANEOUS) IMPLANT
CATH ROBINSON RED A/P 16FR (CATHETERS) IMPLANT
COVER WAND RF STERILE (DRAPES) ×2 IMPLANT
DEVICE MYOSURE LITE (MISCELLANEOUS) IMPLANT
DEVICE MYOSURE REACH (MISCELLANEOUS) ×2 IMPLANT
DILATOR CANAL MILEX (MISCELLANEOUS) IMPLANT
GAUZE 4X4 16PLY RFD (DISPOSABLE) ×2 IMPLANT
GLOVE BIO SURGEON STRL SZ 6.5 (GLOVE) ×2 IMPLANT
GOWN STRL REUS W/TWL LRG LVL3 (GOWN DISPOSABLE) ×2 IMPLANT
IV NS IRRIG 3000ML ARTHROMATIC (IV SOLUTION) ×2 IMPLANT
KIT PROCEDURE FLUENT (KITS) ×2 IMPLANT
KIT TURNOVER CYSTO (KITS) ×2 IMPLANT
MYOSURE XL FIBROID (MISCELLANEOUS)
PACK VAGINAL MINOR WOMEN LF (CUSTOM PROCEDURE TRAY) ×2 IMPLANT
PAD OB MATERNITY 4.3X12.25 (PERSONAL CARE ITEMS) ×2 IMPLANT
PAD PREP 24X48 CUFFED NSTRL (MISCELLANEOUS) ×2 IMPLANT
SEAL ROD LENS SCOPE MYOSURE (ABLATOR) ×2 IMPLANT
SYR 20CC LL (SYRINGE) IMPLANT
SYSTEM TISS REMOVAL MYOSURE XL (MISCELLANEOUS) IMPLANT
TOWEL OR 17X26 10 PK STRL BLUE (TOWEL DISPOSABLE) ×4 IMPLANT
WATER STERILE IRR 500ML POUR (IV SOLUTION) IMPLANT

## 2018-08-15 NOTE — Anesthesia Preprocedure Evaluation (Addendum)
Anesthesia Evaluation  Patient identified by MRN, date of birth, ID band Patient awake    Reviewed: Allergy & Precautions, NPO status , Patient's Chart, lab work & pertinent test results  History of Anesthesia Complications Negative for: history of anesthetic complications  Airway Mallampati: III  TM Distance: >3 FB Neck ROM: Full    Dental no notable dental hx.    Pulmonary neg pulmonary ROS,    Pulmonary exam normal        Cardiovascular negative cardio ROS Normal cardiovascular exam     Neuro/Psych negative neurological ROS  negative psych ROS   GI/Hepatic negative GI ROS, Neg liver ROS,   Endo/Other  Hypothyroidism Morbid obesity  Renal/GU negative Renal ROS  negative genitourinary   Musculoskeletal negative musculoskeletal ROS (+)   Abdominal (+) + obese,   Peds  Hematology negative hematology ROS (+)   Anesthesia Other Findings   Reproductive/Obstetrics negative OB ROS                            Anesthesia Physical Anesthesia Plan  ASA: III  Anesthesia Plan: General   Post-op Pain Management:    Induction: Intravenous  PONV Risk Score and Plan: 3 and Ondansetron, Dexamethasone, Midazolam and Treatment may vary due to age or medical condition  Airway Management Planned: LMA and Oral ETT  Additional Equipment: None  Intra-op Plan:   Post-operative Plan: Extubation in OR  Informed Consent: I have reviewed the patients History and Physical, chart, labs and discussed the procedure including the risks, benefits and alternatives for the proposed anesthesia with the patient or authorized representative who has indicated his/her understanding and acceptance.     Dental advisory given  Plan Discussed with:   Anesthesia Plan Comments:        Anesthesia Quick Evaluation

## 2018-08-15 NOTE — Transfer of Care (Signed)
Immediate Anesthesia Transfer of Care Note  Patient: Melissa Riggs  Procedure(s) Performed: DILATATION & CURETTAGE/HYSTEROSCOPY WITH MYOSURE (N/A Vagina )  Patient Location: PACU  Anesthesia Type:General  Level of Consciousness: awake, alert  and oriented  Airway & Oxygen Therapy: Patient Spontanous Breathing and Patient connected to face mask oxygen  Post-op Assessment: Report given to RN and Post -op Vital signs reviewed and stable  Post vital signs: Reviewed and stable  Last Vitals:  Vitals Value Taken Time  BP 139/84 08/15/2018  8:26 AM  Temp    Pulse 70 08/15/2018  8:29 AM  Resp 14 08/15/2018  8:29 AM  SpO2 100 % 08/15/2018  8:29 AM  Vitals shown include unvalidated device data.  Last Pain:  Vitals:   08/15/18 0706  TempSrc:   PainSc: 0-No pain      Patients Stated Pain Goal: 7 (78/29/56 2130)  Complications: No apparent anesthesia complications

## 2018-08-15 NOTE — Interval H&P Note (Signed)
History and Physical Interval Note:  08/15/2018 7:37 AM  Melissa Riggs  has presented today for surgery, with the diagnosis of ammenorrhea, endometrial polyp.  The various methods of treatment have been discussed with the patient and family. After consideration of risks, benefits and other options for treatment, the patient has consented to  Procedure(s) with comments: [place] (N/A) - endometrial polyp as a surgical intervention.  The patient's history has been reviewed, patient examined, no change in status, stable for surgery.  I have reviewed the patient's chart and labs.  Questions were answered to the patient's satisfaction.     [name]

## 2018-08-15 NOTE — Anesthesia Postprocedure Evaluation (Signed)
Anesthesia Post Note  Patient: Marcy Siren  Procedure(s) Performed: DILATATION & CURETTAGE/HYSTEROSCOPY WITH MYOSURE (N/A Vagina )     Patient location during evaluation: PACU Anesthesia Type: General Level of consciousness: awake and alert Pain management: pain level controlled Vital Signs Assessment: post-procedure vital signs reviewed and stable Respiratory status: spontaneous breathing, nonlabored ventilation and respiratory function stable Cardiovascular status: blood pressure returned to baseline and stable Postop Assessment: no apparent nausea or vomiting Anesthetic complications: no    Last Vitals:  Vitals:   08/15/18 0850 08/15/18 0900  BP:  112/62  Pulse:  73  Resp:  12  Temp:    SpO2: 100% 93%    Last Pain:  Vitals:   08/15/18 0856  TempSrc:   PainSc: 6                  Lidia Collum

## 2018-08-15 NOTE — Op Note (Signed)
Preoperative Diagnosis: Endometrial polyp  Postoperative Diagnosis: Same  Procedure: Hysteroscopy, polypectomy, dilation and curettage  Surgeon: Dr Sumner Boast  Assistants: None  Anesthesia: General via LMA  EBL: Minimal  Fluids: 500 cc  Fluid deficit: 210  Urine output: not recorded  Indications for surgery: The patient is a 23 yo female, who presented with a 4 year history of amenorrhea, hirsutism and obesity. She had a normal TSH, normal prolactin, FSH of 7.4, low estradiol of 19.3, total testosterone of 89, free testosterone of 27, normal DHEAS and 17 hydroxy-progesterone. No bleeding with progesterone withdrawal. Ultrasound with endometrial polyp, PCOS appearing ovaries. Otherwise thin appearing endometrium. She also had an atrophic appearing vagina on exam.  She also underwent a brain MRI for hypothalamic amenorrhea, it was normal.  The risks of the surgery were reviewed with the patient and the consent form was signed prior to her surgery.  Findings: Hysteroscopy: sessile appearing polyp on the posterior uterine wall. Slightly atypical appearance. Thin endometrium, normal tubal ostia bilaterally.   Specimens: Endometrial polyp, endometrial curettings.    Procedure: The patient was taken to the operating room with an IV in place. She was placed in the dorsal lithotomy position and anesthesia was administered. She was prepped and draped in the usual sterile fashion for a vaginal procedure. She voided on the way to the OR. A weighted speculum was placed in the vagina and a single tooth tenaculum was placed on the anterior lip of the cervix. The cervix was dilated to a #21 Pratt dilator. The uterus was sounded to 8 cm. The myosure hysteroscope was inserted into the uterine cavity. With continuous infusion of normal saline, the uterine cavity was visualized with the above findings. The myosure reach was used to resect the polyp. The myosure was removed. The cavity was then curetted  with the small sharp curette. The cavity had the characteristically gritty texture at the end of the procedure. The curette and the single tooth tenaculum were removed. Oozing from the tenaculum site was stopped with pressure and silver nitrate. The speculum was removed. The patients perineum was cleansed and she was taken out of the dorsal lithotomy position.  Upon awakening the LMA was removed and the patient was transferred to the recovery room in stable and awake condition.  The sponge and instrument count were correct. There were no complications.

## 2018-08-15 NOTE — Anesthesia Procedure Notes (Signed)
Procedure Name: LMA Insertion Date/Time: 08/15/2018 7:54 AM Performed by: Jonna Munro, CRNA Pre-anesthesia Checklist: Patient identified, Emergency Drugs available, Suction available, Patient being monitored and Timeout performed Patient Re-evaluated:Patient Re-evaluated prior to induction Oxygen Delivery Method: Circle system utilized Preoxygenation: Pre-oxygenation with 100% oxygen Induction Type: IV induction LMA: LMA with gastric port inserted LMA Size: 4.0 Number of attempts: 1 Placement Confirmation: positive ETCO2 and breath sounds checked- equal and bilateral Dental Injury: Teeth and Oropharynx as per pre-operative assessment

## 2018-08-15 NOTE — Discharge Instructions (Signed)
  Post Anesthesia Home Care Instructions  Activity: Get plenty of rest for the remainder of the day. A responsible individual must stay with you for 24 hours following the procedure.  For the next 24 hours, DO NOT: -Drive a car -Operate machinery -Drink alcoholic beverages -Take any medication unless instructed by your physician -Make any legal decisions or sign important papers.  Meals: Start with liquid foods such as gelatin or soup. Progress to regular foods as tolerated. Avoid greasy, spicy, heavy foods. If nausea and/or vomiting occur, drink only clear liquids until the nausea and/or vomiting subsides. Call your physician if vomiting continues.  Special Instructions/Symptoms: Your throat may feel dry or sore from the anesthesia or the breathing tube placed in your throat during surgery. If this causes discomfort, gargle with warm salt water. The discomfort should disappear within 24 hours.  DISCHARGE INSTRUCTIONS: D&C / D&E The following instructions have been prepared to help you care for yourself upon your return home.   Personal hygiene: . Use sanitary pads for vaginal drainage, not tampons. . Shower the day after your procedure. . NO tub baths, pools or Jacuzzis for 2-3 weeks. . Wipe front to back after using the bathroom.  Activity and limitations: . Do NOT drive or operate any equipment for 24 hours. The effects of anesthesia are still present and drowsiness may result. . Do NOT rest in bed all day. . Walking is encouraged. . Walk up and down stairs slowly. . You may resume your normal activity in one to two days or as indicated by your physician.  Sexual activity: NO intercourse for at least 2 weeks after the procedure, or as indicated by your physician.  Diet: Eat a light meal as desired this evening. You may resume your usual diet tomorrow.  Return to work: You may resume your work activities in one to two days or as indicated by your doctor.  What to expect after  your surgery: Expect to have vaginal bleeding/discharge for 2-3 days and spotting for up to 10 days. It is not unusual to have soreness for up to 1-2 weeks. You may have a slight burning sensation when you urinate for the first day. Mild cramps may continue for a couple of days. You may have a regular period in 2-6 weeks.  Call your doctor for any of the following: . Excessive vaginal bleeding, saturating and changing one pad every hour. . Inability to urinate 6 hours after discharge from hospital. . Pain not relieved by pain medication. . Fever of 100.4 F or greater. . Unusual vaginal discharge or odor.      

## 2018-08-16 ENCOUNTER — Telehealth: Payer: Self-pay | Admitting: *Deleted

## 2018-08-16 ENCOUNTER — Ambulatory Visit (INDEPENDENT_AMBULATORY_CARE_PROVIDER_SITE_OTHER): Payer: BLUE CROSS/BLUE SHIELD | Admitting: Obstetrics and Gynecology

## 2018-08-16 ENCOUNTER — Encounter (HOSPITAL_BASED_OUTPATIENT_CLINIC_OR_DEPARTMENT_OTHER): Payer: Self-pay | Admitting: Obstetrics and Gynecology

## 2018-08-16 ENCOUNTER — Other Ambulatory Visit: Payer: Self-pay

## 2018-08-16 VITALS — BP 126/74 | HR 64 | Wt 278.0 lb

## 2018-08-16 DIAGNOSIS — C55 Malignant neoplasm of uterus, part unspecified: Secondary | ICD-10-CM | POA: Diagnosis not present

## 2018-08-16 DIAGNOSIS — N912 Amenorrhea, unspecified: Secondary | ICD-10-CM

## 2018-08-16 DIAGNOSIS — N970 Female infertility associated with anovulation: Secondary | ICD-10-CM | POA: Diagnosis not present

## 2018-08-16 NOTE — Patient Instructions (Signed)
Uterine Cancer  Uterine cancer is an abnormal growth of cancer tissue (malignant tumor) in the uterus. Unlike noncancerous (benign) tumors, malignant tumors can spread to other parts of the body. Uterine cancer usually occurs after menopause. However, it may also occur around the time that menopause begins. The wall of the uterus has an inner layer of tissue (endometrium) and an outer layer of muscle tissue (myometrium). The most common type of uterine cancer begins in the endometrium (endometrial cancer). Cancer that begins in the myometrium (uterine sarcoma) is very rare. What are the causes? The exact cause of this condition is not known. What increases the risk? You are more likely to develop this condition if you:  Are older than 50.  Have an enlarged endometrium (endometrial hyperplasia).  Use hormone therapy.  Are severely overweight (obese).  Use the medicine tamoxifen.  You are white (Caucasian).  Cannot bear children (are infertile).  Have never been pregnant.  Started menstruating at an age younger than 12 years.  Are older than 52 and are still having menstrual periods.  Have a history of cancer of the ovaries, intestines, or colon or rectum (colorectal cancer).  Have a history of enlarged ovaries with small cysts (polycystic ovarian syndrome).  Have a family history of: ? Uterine cancer. ? Hereditary nonpolyposis colon cancer (HNPCC).  Have diabetes, high blood pressure, thyroid disease, or gallbladder disease.  Use long-term, high-dose birth control pills.  Have been exposed to radiation.  Smoke. What are the signs or symptoms? Symptoms of this condition include:  Abnormal vaginal bleeding or discharge. Bleeding may start as a watery, blood-streaked flow that gradually contains more blood. This is the most common symptom. If you experience abnormal vaginal bleeding, do not assume that it is part of menopause.  Vaginal bleeding after  menopause.  Unexplained weight loss.  Bleeding between periods.  Urination that is difficult, painful, or more frequent than usual.  A lump (mass) in the vagina.  Pain, bloating, or fullness in the abdomen.  Pain in the pelvic area.  Pain during sex. How is this diagnosed? This condition may be diagnosed based on:  Your medical history and your symptoms.  A physical and pelvic exam. Your health care provider will feel your pelvis for any growths or enlarged lymph nodes.  Blood and urine tests.  Imaging tests, such as X-rays, CT scans, ultrasound, or MRIs.  A procedure in which a thin, flexible tube with a light and camera on the end is inserted through the vagina and used to look inside the uterus (hysteroscopy).  A Pap test to check for abnormal cells in the lower part of the uterus (cervix) and the upper vagina.  Removing a tissue sample (biopsy) from the uterine lining to check for cancer cells.  Dilation and curettage (D&C). This is a procedure that involves stretching (dilation) the cervix and scraping (curettage) the inside lining of the uterus to get a biopsy and check for cancer cells. Your cancer will be staged to determine its severity and extent. Staging is an assessment of:  The size of the tumor.  Whether the cancer has spread.  Where the cancer has spread. The stages of uterine cancer are as follows:  Stage I. The cancer is only found in the uterus.  Stage II. The cancer has spread to the cervix.  Stage III. The cancer has spread outside the uterus, but not outside the pelvis. The cancer may have spread to the lymph nodes in the pelvis. Lymph nodes are   part of your body's disease-fighting (immune) system. Lymph nodes are found in many locations in your body, including the neck, underarm, and groin.  Stage IV. The cancer has spread to other parts of the body, such as the bladder or rectum. How is this treated? This condition is often treated with surgery to remove:   The uterus, cervix, fallopian tubes, and ovaries (total hysterectomy).  The uterus and cervix (simple hysterectomy). The type of hysterectomy you will have depends on the extent of your cancer. Lymph nodes near the uterus may also be removed in some cases. Treatment may also include one or more of the following:  Chemotherapy. This uses medicines to kill the cancer cells and prevent their spread.  Radiation therapy. This uses high-energy rays to kill the cancer cells and prevent the spread of cancer.  Chemoradiation. This is a combination treatment that alternates chemotherapy with radiation treatments to enhance the way radiation works.  Brachytherapy. This involves placing radioactive materials inside the body where the cancer was removed.  Hormone therapy. This includes taking medicines that lower the levels of estrogen in the body. Follow these instructions at home: Activity  Return to your normal activities as told by your health care provider. Ask your health care provider what activities are safe for you.  Exercise regularly as told by your health care provider.  Do not drive or use heavy machinery while taking prescription pain medicine. General instructions  Take over-the-counter and prescription medicines only as told by your health care provider.  Maintain a healthy diet.  Work with your health care provider to: ? Manage any long-term (chronic) conditions you have, such as diabetes, high blood pressure, thyroid disease, or gallbladder disease. ? Manage any side effects of your treatment.  Do not use any products that contain nicotine or tobacco, such as cigarettes and e-cigarettes. If you need help quitting, ask your health care provider.  Consider joining a support group to help you cope with stress. Your health care provider may be able to recommend a local or online support group.  Keep all follow-up visits as told by your health care provider. This is important.  Where to find more information  American Cancer Society: https://www.cancer.org  National Cancer Institute (NCI): https://www.cancer.gov Contact a health care provider if:  You have pain in your pelvis or abdomen that gets worse.  You cannot urinate.  You have abnormal bleeding.  You have a fever. Get help right away if:  You develop sudden or new severe symptoms, such as: ? Heavy bleeding. ? Severe weakness. ? Pain that is severe or does not get better with medicine. Summary  Uterine cancer is an abnormal growth of cancer tissue (malignant tumor) in the uterus. The most common type of uterine cancer begins in the endometrium (endometrial cancer).  This condition is often treated with surgery to remove the uterus, cervix, fallopian tubes, and ovaries (total hysterectomy) or the uterus and cervix (simple hysterectomy).  Work with your health care provider to manage any long-term (chronic) conditions you have, such as diabetes, high blood pressure, thyroid disease, or gallbladder disease.  Consider joining a support group to help you cope with stress. Your health care provider may be able to recommend a local or online support group. This information is not intended to replace advice given to you by your health care provider. Make sure you discuss any questions you have with your health care provider. Document Released: 05/25/2005 Document Revised: 05/22/2016 Document Reviewed: 05/22/2016 Elsevier Interactive Patient Education    2019 [place].

## 2018-08-16 NOTE — Progress Notes (Signed)
GYNECOLOGY  VISIT   HPI: 23 y.o.   Married Other or two or more races Hispanic or Latino  female   [id number] with No LMP recorded. (Menstrual status: Irregular Periods).   here for consult regarding biopsy results. She had a hysteroscopy, polypectomy yesterday. She is doing okay, still feels a little cramping, slight back pain.  Pathology: Diagnosis 1. Endometrium, curettage - SCANT FRAGMENT OF ENDOMETRIOID ADENOCARCINOMA. 2. Endometrial polyp - ENDOMETRIOID ADENOCARCINOMA WITH SQUAMOUS DIFFERENTIATION, FIGO GRADE I. - TUMOR PARTIALLY INVOLVES A POLYP.      GYNECOLOGIC HISTORY: No LMP recorded. (Menstrual status: Irregular Periods). Contraception: None Menopausal hormone therapy: None       OB History    Gravida  0   Para  0   Term  0   Preterm  0   AB  0   Living  0     SAB  0   TAB  0   Ectopic  0   Multiple  0   Live Births  0              Patient Active Problem List   Diagnosis Date Noted  . Hypothyroidism 03/02/2018  . Anovulation 03/02/2018    Past Medical History:  Diagnosis Date  . Amenorrhea   . Asthma    when younger, not current problem  . Complication of anesthesia    slow to awaken after tonsils and adenoid surgery yrs ago  . Dysmenorrhea   . Hormone disorder     Past Surgical History:  Procedure Laterality Date  . APPENDECTOMY    . CARPAL TUNNEL RELEASE Bilateral   . CATARACT EXTRACTION, BILATERAL    . DILATATION & CURETTAGE/HYSTEROSCOPY WITH MYOSURE N/A 08/15/2018   Procedure: DILATATION & CURETTAGE/HYSTEROSCOPY WITH MYOSURE;  Surgeon: [name], MD;  Location: [facility];  Service: Gynecology;  Laterality: N/A;  endometrial polyp  . TONSILLECTOMY     and adenoid     Current Outpatient Medications  Medication Sig Dispense Refill  . ibuprofen (ADVIL,MOTRIN) 800 MG tablet Take 1 tablet (800 mg total) by mouth every 8 (eight) hours as needed. 30 tablet 0   No current facility-administered  medications for this visit.      ALLERGIES: Amoxicillin and Penicillins  Family History  Problem Relation Age of Onset  . Depression Brother   . Drug abuse Brother   . Heart disease Maternal Grandmother   . Hypertension Maternal Grandmother   . Hypertension Paternal Grandmother   . Depression Paternal Grandmother     Social History   Socioeconomic History  . Marital status: Married    Spouse name: Not on file  . Number of children: Not on file  . Years of education: Not on file  . Highest education level: Not on file  Occupational History  . Not on file  Social Needs  . Financial resource strain: Not on file  . Food insecurity:    Worry: Not on file    Inability: Not on file  . Transportation needs:    Medical: Not on file    Non-medical: Not on file  Tobacco Use  . Smoking status: Never Smoker  . Smokeless tobacco: Never Used  Substance and Sexual Activity  . Alcohol use: No  . Drug use: No  . Sexual activity: Yes    Birth control/protection: None  Lifestyle  . Physical activity:    Days per week: Not on file    Minutes per session: Not on file  .  Stress: Not on file  Relationships  . Social connections:    Talks on phone: Not on file    Gets together: Not on file    Attends religious service: Not on file    Active member of club or organization: Not on file    Attends meetings of clubs or organizations: Not on file    Relationship status: Not on file  . Intimate partner violence:    Fear of current or ex partner: Not on file    Emotionally abused: Not on file    Physically abused: Not on file    Forced sexual activity: Not on file  Other Topics Concern  . Not on file  Social History Narrative  . Not on file    Review of Systems  Constitutional: Negative.   HENT: Negative.   Eyes: Negative.   Respiratory: Negative.   Cardiovascular: Negative.   Gastrointestinal: Negative.   Genitourinary: Negative.   Musculoskeletal: Negative.   Skin:  Negative.   Neurological: Negative.   Endo/Heme/Allergies: Negative.   Psychiatric/Behavioral: Negative.     PHYSICAL EXAMINATION:    BP 126/74 (BP Location: Right Arm, Patient Position: Sitting, Cuff Size: Large)   Pulse 64   Wt 278 lb (126.1 kg)   BMI 50.85 kg/m     General appearance: alert, cooperative and appears stated age  ASSESSMENT 1)S/P hysteroscopy, polypectomy, D&C yesterday 2)Pathology with endometrial adenocarcinoma, grade 1 3)4 year h/o amenorrhea. The clinical picture looks c/w PCOS with obesity, hirsutism and PCOS appearing ovaries. But, she also has hypothalamic amenorrhea, with a thin endometrium, (other than the cancerous polyp), a low estradiol level, and no w/d to provera. She had a normal brain MRI. 4)Infertility, strongly desires pregancy    PLAN 1)She will f/u with Dr Denman George on Friday, she will discuss if fertility sparing hormonal therapy is an option 2)She also has an appointment to discuss fertility with Dr Kerin Perna on Friday   An After Visit Summary was printed and given to the patient.  ~15 minutes face to face time of which over 50% was spent in counseling.

## 2018-08-16 NOTE — Telephone Encounter (Signed)
Per Dr. Talbert Nan request. Call to patient, OV scheduled for today at 1500 with Dr. Talbert Nan to discuss pathology results. Patient verbalizes understanding and is agreeable.

## 2018-08-16 NOTE — Telephone Encounter (Signed)
Spoke with Melissa Riggs. Request to schedule consult for endometrioid adenocarcinoma. Patient coming in today for consult with Dr. Nelson Chimes to discuss pathology results. Appt held with Dr. Denman George for 08/19/18 at 1045, arriving at 1030. Will return call later today to confirm and place referral at that time.

## 2018-08-16 NOTE — Telephone Encounter (Signed)
Spoke with Sharee Pimple from Dr. Gentry Fitz office regarding a referral on the patient. The patient is scheduled for Dr.Rossi tomorrow at 10:45

## 2018-08-16 NOTE — Telephone Encounter (Signed)
Patient seen in office, notified of results by Dr. Talbert Nan, accepts referral to Dr. Denman George. Contact information provided to patient.   Call returned to [facility], order placed for referral to Dr. Denman George, OV scheduled for 3/13 at 1045 with Dr. Denman George.   Routing to provider for final review. Patient is agreeable to disposition. Will close encounter.   Cc: Melissa Riggs

## 2018-08-18 NOTE — Progress Notes (Signed)
Consult Note: Gyn-Onc  Consult was requested by Dr. Talbert Nan for the evaluation of Melissa Riggs 23 y.o. female  CC:  Chief Complaint  Patient presents with  . Endometrial adenocarcinoma [facility])    Assessment/Plan:  Melissa. SHARI NATT  is a 23 y.o.  year old with a grade 1 endometrial cancer.  She desires fertility and has primary infertility likely secondary to annovulation from PCOS.   I explained the likely cause of her cancer and the association between anovulation, overexposure of the endometrium to estrogen, and lack of progesterone is the likely cause.  It is possible that her malignancy was predominantly contained within a polyp, however more than half of woman who had endometrial cancer identified in a polyp have residual carcinoma in the residual adjacent endometrium.  For this reason I do not believe that her D&C procedure was an adequate curative therapy.  I explained that definitive treatment for endometrial cancer is hysterectomy with lymph node assessment.  However it is an acceptable option to consider a fertility preservation in a patient who desires this, and has low risk features on MRI.  We will order an MRI to evaluate for the invasiveness of her cancer.  If this is minimally on noninvasive on MRI, it is reasonable that we proceed with progesterone suppression of the endometrium.  This can be delivered with either oral or intrauterine device.  It is my preference to deliver via intrauterine device as this is associated with less sequelae of appetite stimulation and weight gain.  Based on my exam I believe it is feasible to place the IUD in the office.  We will plan on doing this in approximately 3 weeks time.  Once the IUD is placed we would resample the endometrium at 3 and 32-month intervals.  If there is no evidence of hyperplasia or malignancy on resampling, she could be considered at that time for IUD removal, and evaluation by her infertility specialist  for achieving pregnancy.   HPI: Melissa Riggs is a 23 year old P0 who is seen in consultation at the request of Dr Talbert Nan for a grade 1 endometrial cancer.  The patient has had primary infertility and amenorrhea for 3 to 4 years.  She has a lifelong history of oligo menorrhagia.  Due to failed attempts at pregnancy she was seen by her OB/GYN physician, Dr. Talbert Nan, who performed a transvaginal ultrasound scan on April 12, 2018.  This showed a uterus measuring 6.4 x 3.7 x 2.8 cm.  The endometrium measured 7.6 mm.  The left and right ovaries were slightly enlarged in volume but did not contain dominant cyst.  They contain multiple tiny follicles around the periphery suggestive of PCOS.  The endometrium contained a 1.5 cm echogenic endometrial mass posteriorly.  She was taken to the operating room on August 15, 2022 hysteroscopy, polypectomy, D&C.  Intraoperative findings were significant for a sessile polyp on the posterior uterine wall.  Slightly atypical appearance.  Thin endometrium.  Normal tubal ostia bilaterally.  The polyp was removed as were endometrial curettings.  The polyp revealed grade 1 endometrioid adenocarcinoma.  The endometrial curettage revealed a scant fragment of endometrioid adenocarcinoma.  The patient strongly desires fertility.  She has a history of an appendectomy as her only prior abdominal surgery.  Her other medical conditions include history of cataracts which were removed, and carpal tunnel surgery.  Her family history is unremarkable for malignancies.  Current Meds:  Outpatient Encounter Medications as of 08/19/2018  Medication Sig  .  ibuprofen (ADVIL,MOTRIN) 800 MG tablet Take 1 tablet (800 mg total) by mouth every 8 (eight) hours as needed.   No facility-administered encounter medications on file as of 08/19/2018.     Allergy:  Allergies  Allergen Reactions  . Amoxicillin Diarrhea, Nausea And Vomiting and Other (See Comments)    dehydration  .  Penicillins Diarrhea, Nausea And Vomiting and Other (See Comments)    Dehydration Has patient had a PCN reaction causing immediate rash, facial/tongue/throat swelling, SOB or lightheadedness with hypotension: No Has patient had a PCN reaction causing severe rash involving mucus membranes or skin necrosis: No Has patient had a PCN reaction that required hospitalization: Yes Has patient had a PCN reaction occurring within the last 10 years: Yes If all of the above answers are "NO", then may proceed with Cephalosporin use.    Social Hx:   Social History   Socioeconomic History  . Marital status: Married    Spouse name: Not on file  . Number of children: Not on file  . Years of education: Not on file  . Highest education level: Not on file  Occupational History  . Not on file  Social Needs  . Financial resource strain: Not on file  . Food insecurity:    Worry: Not on file    Inability: Not on file  . Transportation needs:    Medical: Not on file    Non-medical: Not on file  Tobacco Use  . Smoking status: Never Smoker  . Smokeless tobacco: Never Used  Substance and Sexual Activity  . Alcohol use: No  . Drug use: No  . Sexual activity: Yes    Birth control/protection: None  Lifestyle  . Physical activity:    Days per week: Not on file    Minutes per session: Not on file  . Stress: Not on file  Relationships  . Social connections:    Talks on phone: Not on file    Gets together: Not on file    Attends religious service: Not on file    Active member of club or organization: Not on file    Attends meetings of clubs or organizations: Not on file    Relationship status: Not on file  . Intimate partner violence:    Fear of current or ex partner: Not on file    Emotionally abused: Not on file    Physically abused: Not on file    Forced sexual activity: Not on file  Other Topics Concern  . Not on file  Social History Narrative  . Not on file    Past Surgical Hx:  Past  Surgical History:  Procedure Laterality Date  . APPENDECTOMY    . CARPAL TUNNEL RELEASE Bilateral   . CATARACT EXTRACTION, BILATERAL    . DILATATION & CURETTAGE/HYSTEROSCOPY WITH MYOSURE N/A 08/15/2018   Procedure: DILATATION & CURETTAGE/HYSTEROSCOPY WITH MYOSURE;  Surgeon: [name], MD;  Location: [facility];  Service: Gynecology;  Laterality: N/A;  endometrial polyp  . TONSILLECTOMY     and adenoid     Past Medical Hx:  Past Medical History:  Diagnosis Date  . Amenorrhea   . Asthma    when younger, not current problem  . Complication of anesthesia    slow to awaken after tonsils and adenoid surgery yrs ago  . Dysmenorrhea   . Hormone disorder     Past Gynecological History:  G0, see HPI No LMP recorded. (Menstrual status: Irregular Periods).  Family Hx:  Family  History  Problem Relation Age of Onset  . Depression Brother   . Drug abuse Brother   . Heart disease Maternal Grandmother   . Hypertension Maternal Grandmother   . Hypertension Paternal Grandmother   . Depression Paternal Grandmother     Review of Systems:  Constitutional  Feels well,    ENT Normal appearing ears and nares bilaterally Skin/Breast  No rash, sores, jaundice, itching, dryness Cardiovascular  No chest pain, shortness of breath, or edema  Pulmonary  No cough or wheeze.  Gastro Intestinal  No nausea, vomitting, or diarrhoea. No bright red blood per rectum, no abdominal pain, change in bowel movement, or constipation.  Genito Urinary  No frequency, urgency, dysuria, amenorrhea Musculo Skeletal  No myalgia, arthralgia, joint swelling or pain  Neurologic  No weakness, numbness, change in gait,  Psychology  No depression, anxiety, insomnia.   Vitals:  Blood pressure (!) 140/91, pulse 68, temperature 98.7 F (37.1 C), temperature source Oral, resp. rate 20, height 5\' 2"  (1.575 m), weight 277 lb 3.2 oz (125.7 kg), SpO2 99 %.  Physical Exam: WD in NAD Neck   Supple NROM, without any enlargements.  Lymph Node Survey No cervical supraclavicular or inguinal adenopathy Cardiovascular  Pulse normal rate, regularity and rhythm. S1 and S2 normal.  Lungs  Clear to auscultation bilateraly, without wheezes/crackles/rhonchi. Good air movement.  Skin  Pigmented changes to skin on thighs. Increased hair on abdomen.  Psychiatry  Alert and oriented to person, place, and time  Abdomen  Normoactive bowel sounds, abdomen soft, non-tender and obese without evidence of hernia.  Back No CVA tenderness Genito Urinary  Vulva/vagina: Normal external female genitalia.  No lesions. No discharge or bleeding.  Bladder/urethra:  No lesions or masses, well supported bladder  Vagina: normal  Cervix: Normal appearing, no lesions.  Uterus:  Small, mobile, no parametrial involvement or nodularity.  Adnexa: no discrete masses. Rectal  deferred  Extremities  No bilateral cyanosis, clubbing or edema.   Thereasa Solo, MD  08/19/2018, 11:37 AM

## 2018-08-19 ENCOUNTER — Encounter: Payer: Self-pay | Admitting: Gynecologic Oncology

## 2018-08-19 ENCOUNTER — Inpatient Hospital Stay: Payer: BLUE CROSS/BLUE SHIELD | Attending: Gynecologic Oncology | Admitting: Gynecologic Oncology

## 2018-08-19 ENCOUNTER — Other Ambulatory Visit: Payer: Self-pay

## 2018-08-19 VITALS — BP 140/91 | HR 68 | Temp 98.7°F | Resp 20 | Ht 62.0 in | Wt 277.2 lb

## 2018-08-19 DIAGNOSIS — Z319 Encounter for procreative management, unspecified: Secondary | ICD-10-CM | POA: Diagnosis not present

## 2018-08-19 DIAGNOSIS — E288 Other ovarian dysfunction: Secondary | ICD-10-CM | POA: Diagnosis not present

## 2018-08-19 DIAGNOSIS — C541 Malignant neoplasm of endometrium: Secondary | ICD-10-CM | POA: Insufficient documentation

## 2018-08-19 DIAGNOSIS — Z3202 Encounter for pregnancy test, result negative: Secondary | ICD-10-CM | POA: Insufficient documentation

## 2018-08-19 DIAGNOSIS — Z30430 Encounter for insertion of intrauterine contraceptive device: Secondary | ICD-10-CM | POA: Diagnosis not present

## 2018-08-19 DIAGNOSIS — E282 Polycystic ovarian syndrome: Secondary | ICD-10-CM | POA: Diagnosis not present

## 2018-08-19 NOTE — Patient Instructions (Signed)
Dr Denman George is recommending first obtaining an MRI to assess for invasiveness of the cancer. If the cancer is minimally invasive, or non-invasive, she recommends a trial of progesterone for 3-6 months. She recommends administering the progesterone via an intrauterine device. This can be performed in the office and this appointment has been tentatively scheduled for approximately 3 weeks from now.   If there has been clearance of the cancer on repeat biopsies, the IUD can be considered to be removed after 6 months for attempts at fertility.   Dr Serita Grit office can be reached at [phone]

## 2018-08-22 ENCOUNTER — Telehealth: Payer: Self-pay

## 2018-08-22 NOTE — Telephone Encounter (Signed)
Ms. Fetting said she saw the fertility doctor on Friday 08-19-18. She stated that she is going to go ahead with the IUD placement on 09-09-18.  She will go back to the fertility physician after she has been cleared by Dr. Denman George.

## 2018-08-23 ENCOUNTER — Encounter: Payer: Self-pay | Admitting: Gynecologic Oncology

## 2018-08-23 MED ORDER — LEVONORGESTREL 20 MCG/24HR IU IUD: 1.0000 | INTRAUTERINE_SYSTEM | Freq: Once | INTRAUTERINE | 0 refills | Status: DC

## 2018-08-23 NOTE — Addendum Note (Signed)
Addended by: Joylene John D on: 08/23/2018 10:42 AM   Modules accepted: Orders

## 2018-08-23 NOTE — Progress Notes (Signed)
Script for Mirena IUD faxed to [facility] at [phone] along with clinicals.

## 2018-08-24 ENCOUNTER — Other Ambulatory Visit: Payer: Self-pay

## 2018-08-24 ENCOUNTER — Ambulatory Visit (HOSPITAL_COMMUNITY)
Admission: RE | Admit: 2018-08-24 | Discharge: 2018-08-24 | Disposition: A | Discharge status: Home / Self Care | Payer: BLUE CROSS/BLUE SHIELD | Source: Ambulatory Visit | Attending: Gynecologic Oncology | Admitting: Gynecologic Oncology

## 2018-08-24 DIAGNOSIS — C541 Malignant neoplasm of endometrium: Secondary | ICD-10-CM

## 2018-08-24 MED ORDER — GADOBUTROL 1 MMOL/ML IV SOLN
10.0000 mL | Freq: Once | INTRAVENOUS | Status: AC | PRN
  Administered 2018-08-24: 10 mL via INTRAVENOUS

## 2018-08-25 ENCOUNTER — Telehealth: Payer: Self-pay

## 2018-08-25 NOTE — Telephone Encounter (Signed)
Outgoing call to patient per Joylene John NP regarding MRI of pelvis, results as "no enlarged lymph nodes, MRI showing stage IA by MRI.  Polycystic ovarian syndrome."  Pt voiced understanding. No other needs per pt at this time.

## 2018-08-30 ENCOUNTER — Ambulatory Visit: Payer: BLUE CROSS/BLUE SHIELD | Admitting: Obstetrics and Gynecology

## 2018-08-30 ENCOUNTER — Telehealth: Payer: Self-pay | Admitting: Oncology

## 2018-08-30 NOTE — Telephone Encounter (Signed)
Called [facility] and spoke to [place], [employer].  She said patient's prescription insurance will not cover the Mirena IUD.  They have submitted it to her medical insurance and said it may be 2 days before they know if it is covered.  Asked if it will arrive before 09/09/18 if it is covered and she said said she will leave a note that it needs to arrive by 09/09/18 but is not promising it will arrive in time.  She said patient's out of pocket cost would be about $1100.00.  She said to call back at [phone] if needed to check the status.

## 2018-09-01 ENCOUNTER — Telehealth: Payer: Self-pay | Admitting: *Deleted

## 2018-09-01 ENCOUNTER — Telehealth: Payer: Self-pay | Admitting: Oncology

## 2018-09-01 DIAGNOSIS — C541 Malignant neoplasm of endometrium: Secondary | ICD-10-CM | POA: Diagnosis not present

## 2018-09-01 NOTE — Telephone Encounter (Signed)
Called and moved the patient's appt from 4/3 to 3/31. Explained the no visitor policy and that she will be called the day before to be pre-screened.

## 2018-09-01 NOTE — Telephone Encounter (Signed)
Called [facility] for status on Mirena IUD.  They said it was covered by patient's medical insurance and has been sent to Joylene John, NP at the [facility] to arrive on Monday, 09/05/18.

## 2018-09-05 NOTE — Telephone Encounter (Signed)
Material Management received the IUD this am at 1020. The IUD was delivered to the [place].  Picked up IUD and brought it to the [facility] for insertion tomorrow 09-06-18.

## 2018-09-06 ENCOUNTER — Inpatient Hospital Stay: Payer: BLUE CROSS/BLUE SHIELD

## 2018-09-06 ENCOUNTER — Other Ambulatory Visit: Payer: Self-pay | Admitting: Gynecologic Oncology

## 2018-09-06 ENCOUNTER — Encounter: Payer: Self-pay | Admitting: Gynecologic Oncology

## 2018-09-06 ENCOUNTER — Inpatient Hospital Stay (HOSPITAL_BASED_OUTPATIENT_CLINIC_OR_DEPARTMENT_OTHER): Payer: BLUE CROSS/BLUE SHIELD | Admitting: Gynecologic Oncology

## 2018-09-06 ENCOUNTER — Other Ambulatory Visit: Payer: Self-pay

## 2018-09-06 VITALS — BP 132/82 | HR 84 | Temp 98.7°F | Resp 20 | Ht 62.0 in | Wt 276.0 lb

## 2018-09-06 DIAGNOSIS — Z30430 Encounter for insertion of intrauterine contraceptive device: Secondary | ICD-10-CM | POA: Diagnosis not present

## 2018-09-06 DIAGNOSIS — Z3202 Encounter for pregnancy test, result negative: Secondary | ICD-10-CM

## 2018-09-06 DIAGNOSIS — C541 Malignant neoplasm of endometrium: Secondary | ICD-10-CM

## 2018-09-06 LAB — PREGNANCY, URINE: Preg Test, Ur: NEGATIVE

## 2018-09-06 NOTE — Progress Notes (Signed)
Follow-up Note: Gyn-Onc  Consult was initially requested by Dr. Talbert Nan for the evaluation of Melissa Riggs 23 y.o. female  CC:  Chief Complaint  Patient presents with  . Endometrial adenocarcinoma ([place])  . IUD Insertion    Assessment/Plan:  Melissa Riggs  is a 23 y.o.  year old with a grade 1 endometrial cancer.  She desires fertility and has primary infertility likely secondary to annovulation from PCOS.   IUD was placed today without issues (Mirena).  Plan is for resampling in July, 2020 and September, 2020 and we will then remove the IUD if she has had resolution in malignancy on resampling.   Continue metformin.  HPI: Melissa Riggs is a 23 year old P0 who is seen in consultation at the request of Dr Talbert Nan for a grade 1 endometrial cancer.  The patient has had primary infertility and amenorrhea for 3 to 4 years.  She has a lifelong history of oligo menorrhagia.  Due to failed attempts at pregnancy she was seen by her OB/GYN physician, Dr. Talbert Nan, who performed a transvaginal ultrasound scan on April 12, 2018.  This showed a uterus measuring 6.4 x 3.7 x 2.8 cm.  The endometrium measured 7.6 mm.  The left and right ovaries were slightly enlarged in volume but did not contain dominant cyst.  They contain multiple tiny follicles around the periphery suggestive of PCOS.  The endometrium contained a 1.5 cm echogenic endometrial mass posteriorly.  She was taken to the operating room on August 15, 2022 hysteroscopy, polypectomy, D&C.  Intraoperative findings were significant for a sessile polyp on the posterior uterine wall.  Slightly atypical appearance.  Thin endometrium.  Normal tubal ostia bilaterally.  The polyp was removed as were endometrial curettings.  The polyp revealed grade 1 endometrioid adenocarcinoma.  The endometrial curettage revealed a scant fragment of endometrioid adenocarcinoma.  The patient strongly desires fertility.  She has a  history of an appendectomy as her only prior abdominal surgery.  Her other medical conditions include history of cataracts which were removed, and carpal tunnel surgery.  Her family history is unremarkable for malignancies.  Interval Hx:  She saw a reproductive endocrinologist and was recommended to start Metformin.  Plan was put in place for progesterone therapy until confirmed resolution of the cancer and then removal of IUD and attempted pregnancy.   MRI was performed on 08/24/18 which showed a thin endometrium (3 mm). No discrete endometrial mass. No pelvic adenopathy. FIGO stage IA by MRI.  Current Meds:  Outpatient Encounter Medications as of 09/06/2018  Medication Sig  . ibuprofen (ADVIL,MOTRIN) 800 MG tablet Take 1 tablet (800 mg total) by mouth every 8 (eight) hours as needed.  . metFORMIN (GLUCOPHAGE) 500 MG tablet Take 500 mg by mouth 2 (two) times daily with a meal.  . levonorgestrel (MIRENA) 20 MCG/24HR IUD 1 Intra Uterine Device (1 each total) by Intrauterine route once for 1 dose. To treat endometrial cancer, fertility sparing   No facility-administered encounter medications on file as of 09/06/2018.     Allergy:  Allergies  Allergen Reactions  . Amoxicillin Diarrhea, Nausea And Vomiting and Other (See Comments)    dehydration  . Penicillins Diarrhea, Nausea And Vomiting and Other (See Comments)    Dehydration Has patient had a PCN reaction causing immediate rash, facial/tongue/throat swelling, SOB or lightheadedness with hypotension: No Has patient had a PCN reaction causing severe rash involving mucus membranes or skin necrosis: No Has patient had a PCN reaction that  required hospitalization: Yes Has patient had a PCN reaction occurring within the last 10 years: Yes If all of the above answers are "NO", then may proceed with Cephalosporin use.    Social Hx:   Social History   Socioeconomic History  . Marital status: Married    Spouse name: Not on file  . Number of  children: Not on file  . Years of education: Not on file  . Highest education level: Not on file  Occupational History  . Not on file  Social Needs  . Financial resource strain: Not on file  . Food insecurity:    Worry: Not on file    Inability: Not on file  . Transportation needs:    Medical: Not on file    Non-medical: Not on file  Tobacco Use  . Smoking status: Never Smoker  . Smokeless tobacco: Never Used  Substance and Sexual Activity  . Alcohol use: No  . Drug use: No  . Sexual activity: Yes    Birth control/protection: None  Lifestyle  . Physical activity:    Days per week: Not on file    Minutes per session: Not on file  . Stress: Not on file  Relationships  . Social connections:    Talks on phone: Not on file    Gets together: Not on file    Attends religious service: Not on file    Active member of club or organization: Not on file    Attends meetings of clubs or organizations: Not on file    Relationship status: Not on file  . Intimate partner violence:    Fear of current or ex partner: Not on file    Emotionally abused: Not on file    Physically abused: Not on file    Forced sexual activity: Not on file  Other Topics Concern  . Not on file  Social History Narrative  . Not on file    Past Surgical Hx:  Past Surgical History:  Procedure Laterality Date  . APPENDECTOMY    . CARPAL TUNNEL RELEASE Bilateral   . CATARACT EXTRACTION, BILATERAL    . DILATATION & CURETTAGE/HYSTEROSCOPY WITH MYOSURE N/A 08/15/2018   Procedure: DILATATION & CURETTAGE/HYSTEROSCOPY WITH MYOSURE;  Surgeon: [name], MD;  Location: [facility];  Service: Gynecology;  Laterality: N/A;  endometrial polyp  . TONSILLECTOMY     and adenoid     Past Medical Hx:  Past Medical History:  Diagnosis Date  . Amenorrhea   . Asthma    when younger, not current problem  . Complication of anesthesia    slow to awaken after tonsils and adenoid surgery yrs ago  .  Dysmenorrhea   . Hormone disorder     Past Gynecological History:  G0, see HPI No LMP recorded. (Menstrual status: Irregular Periods).  Family Hx:  Family History  Problem Relation Age of Onset  . Depression Brother   . Drug abuse Brother   . Heart disease Maternal Grandmother   . Hypertension Maternal Grandmother   . Hypertension Paternal Grandmother   . Depression Paternal Grandmother     Review of Systems:  Constitutional  Feels well,    ENT Normal appearing ears and nares bilaterally Skin/Breast  No rash, sores, jaundice, itching, dryness Cardiovascular  No chest pain, shortness of breath, or edema  Pulmonary  No cough or wheeze.  Gastro Intestinal  No nausea, vomitting, or diarrhoea. No bright red blood per rectum, no abdominal pain, change in bowel  movement, or constipation.  Genito Urinary  No frequency, urgency, dysuria, amenorrhea Musculo Skeletal  No myalgia, arthralgia, joint swelling or pain  Neurologic  No weakness, numbness, change in gait,  Psychology  No depression, anxiety, insomnia.   Vitals:  Blood pressure 132/82, pulse 84, temperature 98.7 F (37.1 C), temperature source Oral, resp. rate 20, height 5\' 2"  (1.575 m), weight 276 lb (125.2 kg), SpO2 98 %.  Physical Exam: WD in NAD Neck  Supple NROM, without any enlargements.  Lymph Node Survey No cervical supraclavicular or inguinal adenopathy Cardiovascular  Pulse normal rate, regularity and rhythm. S1 and S2 normal.  Lungs  Clear to auscultation bilateraly, without wheezes/crackles/rhonchi. Good air movement.  Skin  Pigmented changes to skin on thighs. Increased hair on abdomen.  Psychiatry  Alert and oriented to person, place, and time  Abdomen  Normoactive bowel sounds, abdomen soft, non-tender and obese without evidence of hernia.  Back No CVA tenderness Genito Urinary  Vulva/vagina: Normal external female genitalia.  No lesions. No discharge or bleeding.  Bladder/urethra:  No  lesions or masses, well supported bladder  Vagina: normal  Cervix: Normal appearing, no lesions.  Uterus:  Small, mobile, no parametrial involvement or nodularity.  Adnexa: no discrete masses. Rectal  deferred  Extremities  No bilateral cyanosis, clubbing or edema.   Procedure Note:  Preop Dx: endometrial cancer, desires fertility Postop Dx: same Procedure: insertion Mirena IUD Surgeon: Dorann Ou, MD EBL: minimal Specimens: none Complications: none Procedure Details: Patient provided verbal consent and she also provided written consent.  The procedure was explained.  She was placed in the lithotomy position and speculum was inserted into the vagina.  Using sterile gloves a Mirena IUD was inserted to 8 cm depth and deployed.  The lot number of this IUD was [id number].  The tenaculum was removed no bleeding was noted strings were cut to 3 cm.  The patient tolerated the procedure well.  The IUD is scheduled to be removed in March 2025.  The patient was provided with information about the IUD and a card denoting the date of its removal.   Thereasa Solo, MD  09/06/2018, 3:32 PM

## 2018-09-06 NOTE — Patient Instructions (Signed)
You can use Motrin or ibuprofen for cramping as directed on the bottle.  Plan to follow up in three months with Dr. Denman George.  We do not have our schedule out for beginning of July so please call our office in May at [phone] to schedule an appointment.   Intrauterine Device Insertion An intrauterine device (IUD) is a medical device that gets inserted into the uterus to prevent pregnancy. It is a small, T-shaped device that has one or two nylon strings hanging down from it. The strings hang out of the lower part of the uterus (cervix) to allow for future IUD removal. There are two types of IUDs available:  Copper IUD. This type of IUD has copper wire wrapped around it. Copper makes the uterus and fallopian tubes produce a fluid that kills sperm. A copper IUD may last up to 10 years.  Hormone IUD. This type of IUD is made of plastic and contains the hormone progestin (synthetic progesterone). The hormone thickens mucus in the cervix and prevents sperm from entering the uterus. It also thins the uterine lining to prevent implantation of a fertilized egg. The hormone can weaken or kill the sperm that get into the uterus. A hormone IUD may last 3-5 years. Tell a health care provider about:  Any allergies you have.  All medicines you are taking, including vitamins, herbs, eye drops, creams, and over-the-counter medicines.  Any problems you or family members have had with anesthetic medicines.  Any blood disorders you have.  Any surgeries you have had.  Any medical conditions you have, including any STIs (sexually transmitted infections) you may have.  Whether you are pregnant or may be pregnant. What are the risks? Generally, this is a safe procedure. However, problems may occur, including:  Infection.  Bleeding.  Allergic reactions to medicines.  Accidental puncture (perforation) of the uterus, or damage to other structures or organs.  Accidental placement of the IUD either in the  muscle layer of the uterus (myometrium) or outside the uterus.  The IUD falling out of the uterus (expulsion). This is more common among women who have recently had a child.  Pregnancy that happens in the fallopian tube (ectopic pregnancy).  Infection of the uterus and fallopian tubes (pelvic inflammatory disease). What happens before the procedure?  Schedule the IUD insertion for when you will have your menstrual period or right after, to make sure you are not pregnant. Placement of the IUD is better tolerated shortly after a menstrual cycle.  Follow instructions from your health care provider about eating or drinking restrictions.  Ask your health care provider about changing or stopping your regular medicines. This is especially important if you are taking diabetes medicines or blood thinners.  You may get a pain reliever to take before the procedure.  You may have tests for:  Pregnancy. A pregnancy test involves having a urine sample taken.  STIs. Placing an IUD in someone who has an STI can make the infection worse.  Cervical cancer. You may have a Pap test to check for this type of cancer. This means collecting cells from your cervix to be examined under a microscope.  You may have a physical exam to determine the size and position of your uterus. The procedure may vary among health care providers and hospitals. What happens during the procedure?  A tool (speculum) will be placed in your vagina and widened so that your health care provider can see your cervix.  Medicine may be applied to your  cervix to help lower your risk of infection (antiseptic medicine).  You may be given an anesthetic medicine to numb each side of your cervix (intracervical block or paracervical block). This medicine is usually given by an injection into the cervix.  A tool (uterine sound) will be inserted into your uterus to determine the length of your uterus and the direction that your uterus may be  tilted.  A slim instrument or tube (IUD inserter) that holds the IUD will be inserted into your vagina, through your cervical canal, and into your uterus.  The IUD will be placed in the uterus, and the IUD inserter will be removed.  The strings that are attached to the IUD will be trimmed so that they lie just below the cervix. The procedure may vary among health care providers and hospitals. What happens after the procedure?  You may have bleeding after the procedure. This is normal. It varies from light bleeding (spotting) for a few days to menstrual-like bleeding.  You may have cramping and pain.  You may feel dizzy or light-headed.  You may have lower back pain. Summary  An intrauterine device (IUD) is a small, T-shaped device that has one or two nylon strings hanging down from it.  Two types of IUDs are available. You may have a copper IUD or a hormone IUD.  Schedule the IUD insertion for when you will have your menstrual period or right after, to make sure you are not pregnant. Placement of the IUD is better tolerated shortly after a menstrual cycle.  You may have bleeding after the procedure. This is normal. It varies from light spotting for a few days to menstrual-like bleeding. This information is not intended to replace advice given to you by your health care provider. Make sure you discuss any questions you have with your health care provider. Document Released: 01/21/2011 Document Revised: 04/15/2016 Document Reviewed: 04/15/2016 Elsevier Interactive Patient Education  2017 [employer].  Levonorgestrel intrauterine device (IUD) What is this medicine? LEVONORGESTREL IUD (LEE voe nor jes trel) is a contraceptive (birth control) device. The device is placed inside the uterus by a healthcare professional. It is used to prevent pregnancy. This device can also be used to treat heavy bleeding that occurs during your period. This medicine may be used for other purposes; ask  your health care provider or pharmacist if you have questions. COMMON BRAND NAME(S): Minette Headland What should I tell my health care provider before I take this medicine? They need to know if you have any of these conditions: -abnormal Pap smear -cancer of the breast, uterus, or cervix -diabetes -endometritis -genital or pelvic infection now or in the past -have more than one sexual partner or your partner has more than one partner -heart disease -history of an ectopic or tubal pregnancy -immune system problems -IUD in place -liver disease or tumor -problems with blood clots or take blood-thinners -seizures -use intravenous drugs -uterus of unusual shape -vaginal bleeding that has not been explained -an unusual or allergic reaction to levonorgestrel, other hormones, silicone, or polyethylene, medicines, foods, dyes, or preservatives -pregnant or trying to get pregnant -breast-feeding How should I use this medicine? This device is placed inside the uterus by a health care professional. Talk to your pediatrician regarding the use of this medicine in children. Special care may be needed. Overdosage: If you think you have taken too much of this medicine contact a poison control center or emergency room at once. NOTE: This medicine is  only for you. Do not share this medicine with others. What if I miss a dose? This does not apply. Depending on the brand of device you have inserted, the device will need to be replaced every 3 to 5 years if you wish to continue using this type of birth control. What may interact with this medicine? Do not take this medicine with any of the following medications: -amprenavir -bosentan -fosamprenavir This medicine may also interact with the following medications: -aprepitant -armodafinil -barbiturate medicines for inducing sleep or treating seizures -bexarotene -boceprevir -griseofulvin -medicines to treat seizures like  carbamazepine, ethotoin, felbamate, oxcarbazepine, phenytoin, topiramate -modafinil -pioglitazone -rifabutin -rifampin -rifapentine -some medicines to treat HIV infection like atazanavir, efavirenz, indinavir, lopinavir, nelfinavir, tipranavir, ritonavir -St. John's wort -warfarin This list may not describe all possible interactions. Give your health care provider a list of all the medicines, herbs, non-prescription drugs, or dietary supplements you use. Also tell them if you smoke, drink alcohol, or use illegal drugs. Some items may interact with your medicine. What should I watch for while using this medicine? Visit your doctor or health care professional for regular check ups. See your doctor if you or your partner has sexual contact with others, becomes HIV positive, or gets a sexual transmitted disease. This product does not protect you against HIV infection (AIDS) or other sexually transmitted diseases. You can check the placement of the IUD yourself by reaching up to the top of your vagina with clean fingers to feel the threads. Do not pull on the threads. It is a good habit to check placement after each menstrual period. Call your doctor right away if you feel more of the IUD than just the threads or if you cannot feel the threads at all. The IUD may come out by itself. You may become pregnant if the device comes out. If you notice that the IUD has come out use a backup birth control method like condoms and call your health care provider. Using tampons will not change the position of the IUD and are okay to use during your period. This IUD can be safely scanned with magnetic resonance imaging (MRI) only under specific conditions. Before you have an MRI, tell your healthcare provider that you have an IUD in place, and which type of IUD you have in place. What side effects may I notice from receiving this medicine? Side effects that you should report to your doctor or health care professional  as soon as possible: -allergic reactions like skin rash, itching or hives, swelling of the face, lips, or tongue -fever, flu-like symptoms -genital sores -high blood pressure -no menstrual period for 6 weeks during use -pain, swelling, warmth in the leg -pelvic pain or tenderness -severe or sudden headache -signs of pregnancy -stomach cramping -sudden shortness of breath -trouble with balance, talking, or walking -unusual vaginal bleeding, discharge -yellowing of the eyes or skin Side effects that usually do not require medical attention (report to your doctor or health care professional if they continue or are bothersome): -acne -breast pain -change in sex drive or performance -changes in weight -cramping, dizziness, or faintness while the device is being inserted -headache -irregular menstrual bleeding within first 3 to 6 months of use -nausea This list may not describe all possible side effects. Call your doctor for medical advice about side effects. You may report side effects to FDA at 1-800-FDA-1088. Where should I keep my medicine? This does not apply. NOTE: This sheet is a summary. It may not cover  all possible information. If you have questions about this medicine, talk to your doctor, pharmacist, or health care provider.  2018 Elsevier/Gold Standard (2016-03-06 14:14:56)

## 2018-09-07 ENCOUNTER — Telehealth: Payer: Self-pay | Admitting: Oncology

## 2018-09-07 ENCOUNTER — Emergency Department (HOSPITAL_COMMUNITY)
Admission: EM | Admit: 2018-09-07 | Discharge: 2018-09-07 | Disposition: A | Discharge status: Home / Self Care | Payer: BLUE CROSS/BLUE SHIELD | Attending: Emergency Medicine | Admitting: Emergency Medicine

## 2018-09-07 ENCOUNTER — Encounter (HOSPITAL_COMMUNITY): Payer: Self-pay | Admitting: Emergency Medicine

## 2018-09-07 ENCOUNTER — Other Ambulatory Visit: Payer: Self-pay

## 2018-09-07 DIAGNOSIS — R102 Pelvic and perineal pain: Secondary | ICD-10-CM | POA: Insufficient documentation

## 2018-09-07 DIAGNOSIS — Z79899 Other long term (current) drug therapy: Secondary | ICD-10-CM | POA: Diagnosis not present

## 2018-09-07 MED ORDER — FENTANYL CITRATE (PF) 100 MCG/2ML IJ SOLN
100.0000 ug | Freq: Once | INTRAMUSCULAR | Status: AC
  Administered 2018-09-07: 100 ug via INTRAVENOUS
  Filled 2018-09-07: qty 2

## 2018-09-07 MED ORDER — HYDROCODONE-ACETAMINOPHEN 5-325 MG PO TABS
2.0000 | ORAL_TABLET | Freq: Once | ORAL | Status: AC
  Administered 2018-09-07: 2 via ORAL
  Filled 2018-09-07: qty 2

## 2018-09-07 NOTE — Telephone Encounter (Signed)
Patient left a message saying that she is feeling much, much better today.  She said the ER checked the placement of the IUD last night and said everything is fine.

## 2018-09-07 NOTE — ED Provider Notes (Signed)
[facility] Provider Note   CSN: [id number] Arrival date & time: 09/07/18  6948    History   Chief Complaint Chief Complaint  Patient presents with   Pelvic Pain    HPI Melissa Riggs is a 23 y.o. female.     Patient presents to the emergency department with a chief complaint of pelvic cramping.  She states that she had an IUD placed yesterday in the OB/GYN clinic.  She states that she has had some cramping throughout the evening, but significantly worsened into the early morning hours.  She has tried taking ibuprofen with little relief.  Denies any fever chills.  Denies any discharge or bleeding.  Denies any other associated symptoms.  The history is provided by the patient. No language interpreter was used.    Past Medical History:  Diagnosis Date   Amenorrhea    Asthma    when younger, not current problem   Complication of anesthesia    slow to awaken after tonsils and adenoid surgery yrs ago   Dysmenorrhea    Hormone disorder     Patient Active Problem List   Diagnosis Date Noted   Hypothyroidism 03/02/2018   Anovulation 03/02/2018    Past Surgical History:  Procedure Laterality Date   APPENDECTOMY     CARPAL TUNNEL RELEASE Bilateral    CATARACT EXTRACTION, BILATERAL     DILATATION & CURETTAGE/HYSTEROSCOPY WITH MYOSURE N/A 08/15/2018   Procedure: DILATATION & CURETTAGE/HYSTEROSCOPY WITH MYOSURE;  Surgeon: [name], MD;  Location: [place];  Service: Gynecology;  Laterality: N/A;  endometrial polyp   TONSILLECTOMY     and adenoid      OB History    Gravida  0   Para  0   Term  0   Preterm  0   AB  0   Living  0     SAB  0   TAB  0   Ectopic  0   Multiple  0   Live Births  0            Home Medications    Prior to Admission medications   Medication Sig Start Date End Date Taking? Authorizing Provider  ibuprofen (ADVIL,MOTRIN) 800 MG tablet  Take 1 tablet (800 mg total) by mouth every 8 (eight) hours as needed. 08/15/18   [name], MD  levonorgestrel (MIRENA) 20 MCG/24HR IUD 1 Intra Uterine Device (1 each total) by Intrauterine route once for 1 dose. To treat endometrial cancer, fertility sparing 08/23/18 08/23/18  Joylene John D, NP  metFORMIN (GLUCOPHAGE) 500 MG tablet Take 500 mg by mouth 2 (two) times daily with a meal.    [provider]    Family History Family History  Problem Relation Age of Onset   Depression Brother    Drug abuse Brother    Heart disease Maternal Grandmother    Hypertension Maternal Grandmother    Hypertension Paternal Grandmother    Depression Paternal Grandmother     Social History Social History   Tobacco Use   Smoking status: Never Smoker   Smokeless tobacco: Never Used  Substance Use Topics   Alcohol use: No   Drug use: No     Allergies   Amoxicillin and Penicillins   Review of Systems Review of Systems  All other systems reviewed and are negative.    Physical Exam Updated Vital Signs BP (!) 159/122 (BP Location: Right Arm)    Pulse Marland Kitchen)  106    Temp 98.3 F (36.8 C) (Oral)    Resp (!) 22    Ht 5\' 2"  (1.575 m)    Wt 125.2 kg    SpO2 99%    BMI 50.48 kg/m   Physical Exam Vitals signs and nursing note reviewed.  Constitutional:      Appearance: She is well-developed.  HENT:     Head: Normocephalic and atraumatic.  Eyes:     Conjunctiva/sclera: Conjunctivae normal.     Pupils: Pupils are equal, round, and reactive to light.  Neck:     Musculoskeletal: Normal range of motion and neck supple.  Cardiovascular:     Rate and Rhythm: Normal rate and regular rhythm.     Heart sounds: No murmur. No friction rub. No gallop.   Pulmonary:     Effort: Pulmonary effort is normal. No respiratory distress.     Breath sounds: Normal breath sounds. No wheezing or rales.  Chest:     Chest wall: No tenderness.  Abdominal:     General: Bowel sounds are  normal. There is no distension.     Palpations: Abdomen is soft. There is no mass.     Tenderness: There is no abdominal tenderness. There is no guarding or rebound.  Genitourinary:    Comments: Chaperone present for pelvic exam, IUD strings are visible, cervix appears mildly inflamed, no discharge Musculoskeletal: Normal range of motion.        General: No tenderness.  Skin:    General: Skin is warm and dry.  Neurological:     Mental Status: She is alert and oriented to person, place, and time.  Psychiatric:        Behavior: Behavior normal.        Thought Content: Thought content normal.        Judgment: Judgment normal.      ED Treatments / Results  Labs (all labs ordered are listed, but only abnormal results are displayed) Labs Reviewed - No data to display  EKG None  Radiology No results found.  Procedures Procedures (including critical care time)  Medications Ordered in ED Medications  HYDROcodone-acetaminophen (NORCO/VICODIN) 5-325 MG per tablet 2 tablet (has no administration in time range)  fentaNYL (SUBLIMAZE) injection 100 mcg (100 mcg Intravenous Given 09/07/18 0302)     Initial Impression / Assessment and Plan / ED Course  I have reviewed the triage vital signs and the nursing notes.  Pertinent labs & imaging results that were available during my care of the patient were reviewed by me and considered in my medical decision making (see chart for details).        Patient with pelvic cramping after IUD placement yesterday.  Pain controlled in the ED.  IUD strings visualized.  Discussed with Dr. Wyvonnia Dusky, who agrees that patient can be discharged with outpatient follow-up.  Patient feels significantly better and is agreeable with plan.  Final Clinical Impressions(s) / ED Diagnoses   Final diagnoses:  Pelvic cramping    ED Discharge Orders    None       Montine Circle, PA-C 09/07/18 0408    Ezequiel Essex, MD 09/07/18 [phone]

## 2018-09-07 NOTE — Telephone Encounter (Signed)
Left a message for patient checking to see how she is feeling.  Requested a return call.

## 2018-09-07 NOTE — ED Triage Notes (Signed)
C/O of pelvic pain after having IUD placed yesterday. Pt states she has been trying ibuprofen with no relief.

## 2018-09-09 ENCOUNTER — Ambulatory Visit: Payer: BLUE CROSS/BLUE SHIELD | Admitting: Gynecologic Oncology

## 2018-10-19 ENCOUNTER — Telehealth: Payer: Self-pay | Admitting: *Deleted

## 2018-10-19 NOTE — Telephone Encounter (Signed)
Patient called and scheduled an appt for th end of June

## 2018-11-18 ENCOUNTER — Ambulatory Visit (INDEPENDENT_AMBULATORY_CARE_PROVIDER_SITE_OTHER): Payer: BC Managed Care – PPO | Admitting: Family Medicine

## 2018-11-18 ENCOUNTER — Other Ambulatory Visit: Payer: Self-pay

## 2018-11-18 ENCOUNTER — Ambulatory Visit: Payer: Self-pay

## 2018-11-18 ENCOUNTER — Encounter: Payer: Self-pay | Admitting: Family Medicine

## 2018-11-18 ENCOUNTER — Telehealth: Payer: Self-pay

## 2018-11-18 DIAGNOSIS — Z20822 Contact with and (suspected) exposure to covid-19: Secondary | ICD-10-CM

## 2018-11-18 DIAGNOSIS — R6889 Other general symptoms and signs: Secondary | ICD-10-CM

## 2018-11-18 DIAGNOSIS — Z20828 Contact with and (suspected) exposure to other viral communicable diseases: Secondary | ICD-10-CM

## 2018-11-18 NOTE — Telephone Encounter (Signed)
Can we do a video visit to discuss and order testing.

## 2018-11-18 NOTE — Telephone Encounter (Signed)
Pt got scheduled. Doxy.Me VV .

## 2018-11-18 NOTE — Telephone Encounter (Signed)
-----   Message from Luetta Nutting, DO sent at 11/18/2018  3:08 PM EDT ----- Regarding: [id number] Testing Patient Name: Melissa Riggs DOB: 06/11/1995 MRN: [id number] Reason for COVID-19 test:  Direct exposure to COVID, works with several people who have tested positive.  Onset of pharyngitis and fatigue 24 hours ago.  Insurance info:  BCBS  ID: [id number] , Group : [id number]

## 2018-11-18 NOTE — Telephone Encounter (Signed)
Pt. called to report she has been directly exposed to COVID 19 to one coworker that tested positive, and a 2nd coworker that is awaiting test results.  Reported she works in close proximity to her coworkers.  Stated she had onset of sore throat in past 24 hrs.  Denied fever/ chills, headache, body aches, loss of taste/ smell, cough, or shortness of breath.  Stated her Employer is advising she be tested for COVID.  Call placed to [facility] at office.  Will forward Triage note for Provider to review, and make further recommendations.  Pt. made aware that office nurse will be calling her back.  Verb. Understanding.    Reason for Disposition . [1] COVID-19 infection suspected by caller or triager AND [2] mild symptoms (cough, fever, or others) AND [7] no complications or SOB    C/o sore throat x 24 hrs. and direct exposure at work  Answer Assessment - Initial Assessment Questions 1. CLOSE CONTACT: "Who is the person with the confirmed or suspected COVID-19 infection that you were exposed to?"     2 coworkers with close contact  2. PLACE of CONTACT: "Where were you when you were exposed to COVID-19?" (e.g., home, school, medical waiting room; which city?)     At work 3. TYPE of CONTACT: "How much contact was there?" (e.g., sitting next to, live in same house, work in same office, same building)     Side by side work with coworkers 4. DURATION of CONTACT: "How long were you in contact with the COVID-19 patient?" (e.g., a few seconds, passed by person, a few minutes, live with the patient)     Short intervals  5. DATE of CONTACT: "When did you have contact with a COVID-19 patient?" (e.g., how many days ago)     Last Thurs., 6/4; and another individual 6/11 6. TRAVEL: "Have you traveled out of the country recently?" If so, "When and where?"     * Also ask about out-of-state travel, since the CDC has identified some high-risk cities for community spread in the [place].     * Note: Travel becomes less relevant if there  is widespread community transmission where the patient lives.     [place] just over 2 weeks ago  7. COMMUNITY SPREAD: "Are there lots of cases of COVID-19 (community spread) where you live?" (See public health department website, if unsure)       Yes  8. SYMPTOMS: "Do you have any symptoms?" (e.g., fever, cough, breathing difficulty)     Sore throat x 24 hrs. ; denied headache, cough, shortness of breath, chest discomfort, diarrhea, body aches, or loss of taste / smell 9. PREGNANCY OR POSTPARTUM: "Is there any chance you are pregnant?" "When was your last menstrual period?" "Did you deliver in the last 2 weeks?"    LMP: has uterine CA 10. HIGH RISK: "Do you have any heart or lung problems? Do you have a weak immune system?" (e.g., CHF, COPD, asthma, HIV positive, chemotherapy, renal failure, diabetes mellitus, sickle cell anemia)       No immunosuppression; [place]  Answer Assessment - Initial Assessment Questions 1. COVID-19 DIAGNOSIS: "Who made your Coronavirus (COVID-19) diagnosis?" "Was it confirmed by a positive lab test?" If not diagnosed by a HCP, ask "Are there lots of cases (community spread) where you live?" (See public health department website, if unsure)    Not diagnosed; exposed at work. Employer wants her to be screened for COVID 2. ONSET: "When did the COVID-19 symptoms start?"  11/17/18 3. WORST SYMPTOM: "What is your worst symptom?" (e.g., cough, fever, shortness of breath, muscle aches)     Sore throat 4. COUGH: "Do you have a cough?" If so, ask: "How bad is the cough?"       denied 5. FEVER: "Do you have a fever?" If so, ask: "What is your temperature, how was it measured, and when did it start?"     denied 6. RESPIRATORY STATUS: "Describe your breathing?" (e.g., shortness of breath, wheezing, unable to speak)      denied 7. BETTER-SAME-WORSE: "Are you getting better, staying the same or getting worse compared to yesterday?"  If getting worse, ask, "In what  way?"     same 8. HIGH RISK DISEASE: "Do you have any chronic medical problems?" (e.g., asthma, heart or lung disease, weak immune system, etc.)     Uterine CA; is not on treatment; denied immunosuppression; has Asthma 9. PREGNANCY: "Is there any chance you are pregnant?" "When was your last menstrual period?"    Has Uterine CA; has not had Menstrual cycle in a long while 10. OTHER SYMPTOMS: "Do you have any other symptoms?"  (e.g., chills, fatigue, headache, loss of smell or taste, muscle pain, sore throat)     Denied any other symptoms except sore throat  Protocols used: CORONAVIRUS (COVID-19) DIAGNOSED OR SUSPECTED-A-AH, CORONAVIRUS (COVID-19) EXPOSURE-A-AH

## 2018-11-18 NOTE — Assessment & Plan Note (Signed)
-  Direct exposure to known COVID case and currently symptomatic.  -Message sent to [facility] pool for testing.  -Instructed to self isolate and sign up for mychart.  Will enroll in home monitoring as well once signed up.   -Red flags reviewed

## 2018-11-18 NOTE — Progress Notes (Signed)
Melissa Riggs - 23 y.o. female MRN [id number]  Date of birth: 1996/01/22   This visit type was conducted due to national recommendations for restrictions regarding the COVID-19 Pandemic (e.g. social distancing).  This format is felt to be most appropriate for this patient at this time.  All issues noted in this document were discussed and addressed.  No physical exam was performed (except for noted visual exam findings with Video Visits).  I discussed the limitations of evaluation and management by telemedicine and the availability of in person appointments. The patient expressed understanding and agreed to proceed.  I connected with@ on 11/18/18 at  3:00 PM EDT by a video enabled telemedicine application and verified that I am speaking with the correct person using two identifiers.   Patient Location: home [address] [place] [address]   Provider location:   Home office  Chief Complaint  Patient presents with  . Sore Throat    direct COVID exposure at work , Sx/Sy onset 24 hrs , Request Test      HPI  Melissa Riggs is a 23 y.o. female who presents via audio/video conferencing for a telehealth visit today.  Reports concern for COVID today.  She states that she works in a [employer] forces technical [employer] and her boss and another Public affairs consultant who she has had close contact with have symptoms of COVID.  Her boss tested positive and line worker test is still pending.  She reports that 24 hours ago she developed symptoms of mild fatigue and sore throat.  She denies fever, chills, cough, shortness of breath, congestion, change in smell or taste, or rash.  She is self isolating at home.    ROS:  A comprehensive ROS was completed and negative except as noted per HPI  Past Medical History:  Diagnosis Date  . Amenorrhea   . Asthma    when younger, not current problem  . Complication of anesthesia    slow to awaken after tonsils and adenoid surgery yrs ago  . Dysmenorrhea   . Hormone  disorder     Past Surgical History:  Procedure Laterality Date  . APPENDECTOMY    . CARPAL TUNNEL RELEASE Bilateral   . CATARACT EXTRACTION, BILATERAL    . DILATATION & CURETTAGE/HYSTEROSCOPY WITH MYOSURE N/A 08/15/2018   Procedure: DILATATION & CURETTAGE/HYSTEROSCOPY WITH MYOSURE;  Surgeon: [name], MD;  Location: [facility];  Service: Gynecology;  Laterality: N/A;  endometrial polyp  . TONSILLECTOMY     and adenoid     Family History  Problem Relation Age of Onset  . Depression Brother   . Drug abuse Brother   . Heart disease Maternal Grandmother   . Hypertension Maternal Grandmother   . Hypertension Paternal Grandmother   . Depression Paternal Grandmother     Social History   Socioeconomic History  . Marital status: Married    Spouse name: Not on file  . Number of children: Not on file  . Years of education: Not on file  . Highest education level: Not on file  Occupational History  . Not on file  Social Needs  . Financial resource strain: Not on file  . Food insecurity    Worry: Not on file    Inability: Not on file  . Transportation needs    Medical: Not on file    Non-medical: Not on file  Tobacco Use  . Smoking status: Never Smoker  . Smokeless tobacco: Never Used  Substance and Sexual Activity  .  Alcohol use: No  . Drug use: No  . Sexual activity: Yes    Birth control/protection: None  Lifestyle  . Physical activity    Days per week: Not on file    Minutes per session: Not on file  . Stress: Not on file  Relationships  . Social Herbalist on phone: Not on file    Gets together: Not on file    Attends religious service: Not on file    Active member of club or organization: Not on file    Attends meetings of clubs or organizations: Not on file    Relationship status: Not on file  . Intimate partner violence    Fear of current or ex partner: Not on file    Emotionally abused: Not on file    Physically abused:  Not on file    Forced sexual activity: Not on file  Other Topics Concern  . Not on file  Social History Narrative  . Not on file     Current Outpatient Medications:  .  metFORMIN (GLUCOPHAGE) 1000 MG tablet, DAY 1 3 TAKE 1 2 TABLET BY MOUTH IN THE MORNING.DAY 4 6 TAKE 1 2 TABLET IN THE MORNING AND 1 2 TABLET IN THE EVENING. DAYS 7 9 TAKE 1 TABLET, Disp: , Rfl:  .  ibuprofen (ADVIL,MOTRIN) 800 MG tablet, Take 1 tablet (800 mg total) by mouth every 8 (eight) hours as needed. (Patient not taking: Reported on 11/18/2018), Disp: 30 tablet, Rfl: 0 .  levonorgestrel (MIRENA) 20 MCG/24HR IUD, 1 Intra Uterine Device (1 each total) by Intrauterine route once for 1 dose. To treat endometrial cancer, fertility sparing, Disp: 1 each, Rfl: 0  EXAM:  VITALS per patient if applicable: Temp (!) 08.6 F (36.3 C) (Skin)   Ht 5\' 2"  (1.575 m)   Wt 276 lb (125.2 kg)   BMI 50.48 kg/m   GENERAL: alert, oriented, appears well and in no acute distress  HEENT: atraumatic, conjunttiva clear, no obvious abnormalities on inspection of external nose and ears  NECK: normal movements of the head and neck  LUNGS: on inspection no signs of respiratory distress, breathing rate appears normal, no obvious gross SOB, gasping or wheezing  CV: no obvious cyanosis  MS: moves all visible extremities without noticeable abnormality  PSYCH/NEURO: pleasant and cooperative, no obvious depression or anxiety, speech and thought processing grossly intact  ASSESSMENT AND PLAN:  Discussed the following assessment and plan:  Suspected Covid-19 Virus Infection -Direct exposure to known COVID case and currently symptomatic.  -Message sent to [facility] pool for testing.  -Instructed to self isolate and sign up for mychart.  Will enroll in home monitoring as well once signed up.   -Red flags reviewed        I discussed the assessment and treatment plan with the patient. The patient was provided an opportunity to ask questions and  all were answered. The patient agreed with the plan and demonstrated an understanding of the instructions.   The patient was advised to call back or seek an in-person evaluation if the symptoms worsen or if the condition fails to improve as anticipated.    Luetta Nutting, DO

## 2018-11-18 NOTE — Telephone Encounter (Signed)
CM-Plz see triage note below/pt has been exposed to COVID-19/only Sx is sore throat/her work told her she has to get tested/plz advise/thx dmf

## 2018-11-18 NOTE — Telephone Encounter (Signed)
PC to pt. To schedule appt. For COVID testing.  Appt. Scheduled at 8:00 AM, 11/21/18 @ [facility].  Advised to wear mask, drive up to test site, and remain in the car.  Verb. Understanding.

## 2018-11-21 ENCOUNTER — Other Ambulatory Visit: Payer: BC Managed Care – PPO

## 2018-11-21 ENCOUNTER — Encounter: Payer: Self-pay | Admitting: Family Medicine

## 2018-11-21 DIAGNOSIS — Z20822 Contact with and (suspected) exposure to covid-19: Secondary | ICD-10-CM

## 2018-11-21 DIAGNOSIS — R6889 Other general symptoms and signs: Secondary | ICD-10-CM | POA: Diagnosis not present

## 2018-11-22 ENCOUNTER — Telehealth: Payer: Self-pay

## 2018-11-22 NOTE — Telephone Encounter (Signed)
Per Dr. Zigmund Daniel, Pt had MyChart VV. He requested to F/U with Pt after 11/18/2018 VV. Called Pt she is doing well and returned back to work. No Sx/Sy . Call completed.

## 2018-11-23 LAB — NOVEL CORONAVIRUS, NAA: SARS-CoV-2, NAA: NOT DETECTED

## 2018-12-02 ENCOUNTER — Encounter: Payer: Self-pay | Admitting: Gynecologic Oncology

## 2018-12-02 ENCOUNTER — Inpatient Hospital Stay: Payer: BC Managed Care – PPO | Attending: Gynecologic Oncology | Admitting: Gynecologic Oncology

## 2018-12-02 ENCOUNTER — Other Ambulatory Visit: Payer: Self-pay

## 2018-12-02 VITALS — BP 123/61 | HR 68 | Temp 98.5°F | Resp 19 | Ht 62.0 in | Wt 281.4 lb

## 2018-12-02 DIAGNOSIS — Z975 Presence of (intrauterine) contraceptive device: Secondary | ICD-10-CM

## 2018-12-02 DIAGNOSIS — E282 Polycystic ovarian syndrome: Secondary | ICD-10-CM | POA: Diagnosis not present

## 2018-12-02 DIAGNOSIS — C541 Malignant neoplasm of endometrium: Secondary | ICD-10-CM

## 2018-12-02 DIAGNOSIS — N970 Female infertility associated with anovulation: Secondary | ICD-10-CM | POA: Insufficient documentation

## 2018-12-02 NOTE — Progress Notes (Signed)
Follow-up Note: Gyn-Onc  Consult was initially requested by Dr. Talbert Nan for the evaluation of Melissa Riggs 23 y.o. female  CC:  Chief Complaint  Patient presents with  . endometrial cancer    Assessment/Plan:  Melissa Riggs  is a 23 y.o.  year old with a grade 1 endometrial cancer.  She desires fertility and has primary infertility likely secondary to annovulation from PCOS.  S/p IUD in March, 2020 (Mirena, progestin releasing).  If no malignancy found on sampling, plan is for resampling in September, 2020 and we will then remove the IUD if she has had resolution in malignancy on resampling.  If malignancy is persistent she will decide if she wants to continue progestins (might add oral megace) and resample in September, or proceed with hysterectomy and definitive management.  Continue metformin.  HPI: Melissa Riggs is a 23 year old P0 who is seen in consultation at the request of Dr Talbert Nan for a grade 1 endometrial cancer.  The patient has had primary infertility and amenorrhea for 3 to 4 years.  She has a lifelong history of oligo menorrhagia.  Due to failed attempts at pregnancy she was seen by her OB/GYN physician, Dr. Talbert Nan, who performed a transvaginal ultrasound scan on April 12, 2018.  This showed a uterus measuring 6.4 x 3.7 x 2.8 cm.  The endometrium measured 7.6 mm.  The left and right ovaries were slightly enlarged in volume but did not contain dominant cyst.  They contain multiple tiny follicles around the periphery suggestive of PCOS.  The endometrium contained a 1.5 cm echogenic endometrial mass posteriorly.  She was taken to the operating room on August 15, 2022 hysteroscopy, polypectomy, D&C.  Intraoperative findings were significant for a sessile polyp on the posterior uterine wall.  Slightly atypical appearance.  Thin endometrium.  Normal tubal ostia bilaterally.  The polyp was removed as were endometrial curettings.  The polyp  revealed grade 1 endometrioid adenocarcinoma.  The endometrial curettage revealed a scant fragment of endometrioid adenocarcinoma.  The patient strongly desires fertility.  She has a history of an appendectomy as her only prior abdominal surgery.  Her other medical conditions include history of cataracts which were removed, and carpal tunnel surgery.  Her family history is unremarkable for malignancies.  She saw a reproductive endocrinologist and was recommended to start Metformin.  Plan was put in place for progesterone therapy until confirmed resolution of the cancer and then removal of IUD and attempted pregnancy.   MRI was performed on 08/24/18 which showed a thin endometrium (3 mm). No discrete endometrial mass. No pelvic adenopathy. FIGO stage IA by MRI.  The patient received a Mirena progestin-releasing IUD on 09/06/18.   Interval Hx:  Since receiving the IUD she has had no bleeding symptoms. She is beginning to be uncertain regarding her desire to preserve fertility as she recognizes that pregnancy is not guaranteed even if the cancer resolves.  Current Meds:  Outpatient Encounter Medications as of 12/02/2018  Medication Sig  . albuterol (PROVENTIL HFA;VENTOLIN HFA) 108 (90 BASE) MCG/ACT inhaler Inhale 2 puffs into the lungs every 6 (six) hours as needed.  Marland Kitchen HYDROcodone-acetaminophen (NORCO) 5-325 MG per tablet 1-2 tabs po q6 hours prn pain  . HYDROcodone-acetaminophen (NORCO) 5-325 MG per tablet 1-2 tabs po q6 hours prn pain  . ibuprofen (ADVIL,MOTRIN) 800 MG tablet Take 1 tablet (800 mg total) by mouth every 8 (eight) hours as needed. (Patient not taking: Reported on 11/18/2018)  . levonorgestrel (MIRENA) 20  MCG/24HR IUD 1 Intra Uterine Device (1 each total) by Intrauterine route once for 1 dose. To treat endometrial cancer, fertility sparing  . metFORMIN (GLUCOPHAGE) 1000 MG tablet DAY 1 3 TAKE 1 2 TABLET BY MOUTH IN THE MORNING.DAY 4 6 TAKE 1 2 TABLET IN THE MORNING AND 1 2 TABLET IN  THE EVENING. DAYS 7 9 TAKE 1 TABLET   No facility-administered encounter medications on file as of 12/02/2018.     Allergy:  Allergies  Allergen Reactions  . Betadine [Povidone Iodine] Itching    Rxn. After last surgery, prepped with betadine.  . Amoxicillin Diarrhea, Nausea And Vomiting and Other (See Comments)    dehydration  . Amoxicillin Diarrhea  . Penicillins Diarrhea, Nausea And Vomiting and Other (See Comments)    Dehydration Has patient had a PCN reaction causing immediate rash, facial/tongue/throat swelling, SOB or lightheadedness with hypotension: No Has patient had a PCN reaction causing severe rash involving mucus membranes or skin necrosis: No Has patient had a PCN reaction that required hospitalization: Yes Has patient had a PCN reaction occurring within the last 10 years: Yes If all of the above answers are "NO", then may proceed with Cephalosporin use.    Social Hx:   Social History   Socioeconomic History  . Marital status: Married    Spouse name: Not on file  . Number of children: Not on file  . Years of education: Not on file  . Highest education level: Not on file  Occupational History  . Not on file  Social Needs  . Financial resource strain: Not on file  . Food insecurity    Worry: Not on file    Inability: Not on file  . Transportation needs    Medical: Not on file    Non-medical: Not on file  Tobacco Use  . Smoking status: Never Smoker  . Smokeless tobacco: Never Used  Substance and Sexual Activity  . Alcohol use: No  . Drug use: No  . Sexual activity: Yes    Birth control/protection: None    Comment: had miscarriage may 2013  Lifestyle  . Physical activity    Days per week: Not on file    Minutes per session: Not on file  . Stress: Not on file  Relationships  . Social Herbalist on phone: Not on file    Gets together: Not on file    Attends religious service: Not on file    Active member of club or organization: Not on  file    Attends meetings of clubs or organizations: Not on file    Relationship status: Not on file  . Intimate partner violence    Fear of current or ex partner: Not on file    Emotionally abused: Not on file    Physically abused: Not on file    Forced sexual activity: Not on file  Other Topics Concern  . Not on file  Social History Narrative   ** Merged History Encounter **        Past Surgical Hx:  Past Surgical History:  Procedure Laterality Date  . ADENOIDECTOMY    . APPENDECTOMY    . CARPAL TUNNEL RELEASE  12/14/2011   Procedure: CARPAL TUNNEL RELEASE;  Surgeon: Tennis Must, MD;  Location: [place];  Service: Orthopedics;  Laterality: Left;  . CARPAL TUNNEL RELEASE  01/28/2012   Procedure: CARPAL TUNNEL RELEASE;  Surgeon: Tennis Must, MD;  Location: [place];  Service: Orthopedics;  Laterality: Right;  right carpal tunnel release  . CARPAL TUNNEL RELEASE Bilateral   . CATARACT EXTRACTION, BILATERAL    . DILATATION & CURETTAGE/HYSTEROSCOPY WITH MYOSURE N/A 08/15/2018   Procedure: DILATATION & CURETTAGE/HYSTEROSCOPY WITH MYOSURE;  Surgeon: [name], MD;  Location: [facility];  Service: Gynecology;  Laterality: N/A;  endometrial polyp  . DILATION AND CURETTAGE OF UTERUS  for miscarriage this year  . TONSILLECTOMY    . TONSILLECTOMY     and adenoid     Past Medical Hx:  Past Medical History:  Diagnosis Date  . Amenorrhea   . Asthma   . Asthma    when younger, not current problem  . Complication of anesthesia    slow to awaken after tonsils and adenoid surgery yrs ago  . Dysmenorrhea   . Hormone disorder   . Vision abnormalities    wears glasses    Past Gynecological History:  G0, see HPI No LMP recorded. (Menstrual status: IUD).  Family Hx:  Family History  Problem Relation Age of Onset  . Depression Brother   . Drug abuse Brother   . Heart disease Maternal Grandmother   . Hypertension Maternal  Grandmother   . Hypertension Paternal Grandmother   . Depression Paternal Grandmother     Review of Systems:  Constitutional  Feels well,    ENT Normal appearing ears and nares bilaterally Skin/Breast  No rash, sores, jaundice, itching, dryness Cardiovascular  No chest pain, shortness of breath, or edema  Pulmonary  No cough or wheeze.  Gastro Intestinal  No nausea, vomitting, or diarrhoea. No bright red blood per rectum, no abdominal pain, change in bowel movement, or constipation.  Genito Urinary  No frequency, urgency, dysuria, amenorrhea Musculo Skeletal  No myalgia, arthralgia, joint swelling or pain  Neurologic  No weakness, numbness, change in gait,  Psychology  No depression, anxiety, insomnia.   Vitals:  Blood pressure 123/61, pulse 68, temperature 98.5 F (36.9 C), temperature source Oral, resp. rate 19, height 5\' 2"  (1.575 m), weight 281 lb 6.4 oz (127.6 kg), SpO2 99 %.  Physical Exam: WD in NAD Neck  Supple NROM, without any enlargements.  Lymph Node Survey No cervical supraclavicular or inguinal adenopathy Cardiovascular  Pulse normal rate, regularity and rhythm. S1 and S2 normal.  Lungs  Clear to auscultation bilateraly, without wheezes/crackles/rhonchi. Good air movement.  Skin  Pigmented changes to skin on thighs. Increased hair on abdomen.  Psychiatry  Alert and oriented to person, place, and time  Abdomen  Normoactive bowel sounds, abdomen soft, non-tender and obese without evidence of hernia.  Back No CVA tenderness Genito Urinary  Vulva/vagina: Normal external female genitalia.  No lesions. No discharge or bleeding.  Bladder/urethra:  No lesions or masses, well supported bladder  Vagina: normal  Cervix: Normal appearing, no lesions.  Uterus:  Small, mobile, no parametrial involvement or nodularity.  Adnexa: no discrete masses. Rectal  deferred  Extremities  No bilateral cyanosis, clubbing or edema.   Procedure Note:  Preop Dx:  endometrial cancer, progestin releasing IUD treatment Postop Dx: same Procedure: endometrial biopsy Surgeon: Dorann Ou, MD EBL: minimal Specimens: endometrial biopsy for pathology Complications: none Procedure Details: Patient provided verbal consent and she also provided written consent.  The procedure was explained.  She was placed in the lithotomy position and speculum was inserted into the vagina.  The cervix was grasped with a single-tooth tenaculum.  The endometrial Pipelle was inserted to a depth of 7 cm.  It was aspirated.  There was a small amount of tissue that was retrieved.  The IUD strings were seen from the cervix both before and after completion of the procedure which represented nondisplacement of the IUD.  Hemostasis was achieved at the tenaculum sites with silver nitrate.  The patient tolerated the procedure well and the specimen sent for histopathology.   Thereasa Solo, MD  12/02/2018, 12:26 PM

## 2018-12-02 NOTE — Patient Instructions (Signed)
Dr Denman George obtained a biopsy of the uterus today to evaluate whether the IUD has caused the cancer to go away. If it has, she recommends continuing the IUD until September, 2020 then repeating sampling (like today) in the office. If the testing remains negative for cancer, the IUD can be removed if you desitre to try to get pregnant.  If the biopsy today shows cancer is still present, the options are to try for a little longer (repeat sampling in September) or proceed with hysterectomy.  Her office will contact you with the results of the biopsy next week as soon as they are back.  Her office number is [phone].

## 2018-12-07 ENCOUNTER — Telehealth: Payer: Self-pay

## 2018-12-07 NOTE — Telephone Encounter (Signed)
Told Melissa Riggs the results of biopsy as noted below by Dr. Denman George.  Pt scheduled for f/u on 03-03-19 with repeat endo biopsy.

## 2018-12-07 NOTE — Telephone Encounter (Signed)
LM for patient to call back to discuss the results of the endometrial biopsy.

## 2018-12-07 NOTE — Telephone Encounter (Signed)
-----   Message from Everitt Amber, MD sent at 12/06/2018  6:22 PM EDT ----- Regarding: biopsy and plan Biopsy benign - reasonable to continue the IUD for 3 more months, then repeat endometrial biopsy at that time, and, if still benign, remove the IUD if she'd like to try to get pregnant.  Melissa Riggs ----- Message ----- From: Interface, Lab In [facility] Sent: 12/05/2018   4:01 PM EDT To: Everitt Amber, MD

## 2018-12-23 ENCOUNTER — Telehealth (INDEPENDENT_AMBULATORY_CARE_PROVIDER_SITE_OTHER): Payer: BC Managed Care – PPO | Admitting: Family Medicine

## 2018-12-23 ENCOUNTER — Encounter: Payer: Self-pay | Admitting: Family Medicine

## 2018-12-23 DIAGNOSIS — J069 Acute upper respiratory infection, unspecified: Secondary | ICD-10-CM

## 2018-12-23 NOTE — Assessment & Plan Note (Signed)
Symptoms improving, recommend continued conservative care.  Push fluids and rest.  If continuing to improve she may return to work on 7/20

## 2018-12-23 NOTE — Progress Notes (Signed)
Melissa Riggs - 23 y.o. female MRN [id number]  Date of birth: 1996-01-29   This visit type was conducted due to national recommendations for restrictions regarding the COVID-19 Pandemic (e.g. social distancing).  This format is felt to be most appropriate for this patient at this time.  All issues noted in this document were discussed and addressed.  No physical exam was performed (except for noted visual exam findings with Video Visits).  I discussed the limitations of evaluation and management by telemedicine and the availability of in person appointments. The patient expressed understanding and agreed to proceed.  I connected with@ on 12/23/18 at  9:30 AM EDT by a video enabled telemedicine application and verified that I am speaking with the correct person using two identifiers.   Patient Location: Home [place] [address]   Provider location:   Home office  Chief Complaint  Patient presents with  . Sore Throat    Onset 4 days, cough/sore throat, runny nose/headache-no known exposure, mask at work     HPI  Melissa Riggs is a 23 y.o. female who presents via [employer], civil (consulting) for a telehealth visit today.  Reports 4 day history of congestion, sore throat, stuffy nose and mild headache.  Nieces/Nephews with similar symptoms, tested negative for COVID and told just a cold.  She has not had fever, chills, body aches or shortness of breath.  She is feeling better today compared to yesterday.     ROS:  A comprehensive ROS was completed and negative except as noted per HPI  Past Medical History:  Diagnosis Date  . Amenorrhea   . Asthma   . Asthma    when younger, not current problem  . Complication of anesthesia    slow to awaken after tonsils and adenoid surgery yrs ago  . Dysmenorrhea   . Hormone disorder   . Vision abnormalities    wears glasses    Past Surgical History:  Procedure Laterality Date  . ADENOIDECTOMY    . APPENDECTOMY     . CARPAL TUNNEL RELEASE  12/14/2011   Procedure: CARPAL TUNNEL RELEASE;  Surgeon: Tennis Must, MD;  Location: [facility];  Service: Orthopedics;  Laterality: Left;  . CARPAL TUNNEL RELEASE  01/28/2012   Procedure: CARPAL TUNNEL RELEASE;  Surgeon: Tennis Must, MD;  Location: [facility];  Service: Orthopedics;  Laterality: Right;  right carpal tunnel release  . CARPAL TUNNEL RELEASE Bilateral   . CATARACT EXTRACTION, BILATERAL    . DILATATION & CURETTAGE/HYSTEROSCOPY WITH MYOSURE N/A 08/15/2018   Procedure: DILATATION & CURETTAGE/HYSTEROSCOPY WITH MYOSURE;  Surgeon: [name], MD;  Location: [facility];  Service: Gynecology;  Laterality: N/A;  endometrial polyp  . DILATION AND CURETTAGE OF UTERUS  for miscarriage this year  . TONSILLECTOMY    . TONSILLECTOMY     and adenoid     Family History  Problem Relation Age of Onset  . Depression Brother   . Drug abuse Brother   . Heart disease Maternal Grandmother   . Hypertension Maternal Grandmother   . Hypertension Paternal Grandmother   . Depression Paternal Grandmother     Social History   Socioeconomic History  . Marital status: Married    Spouse name: Not on file  . Number of children: Not on file  . Years of education: Not on file  . Highest education level: Not on file  Occupational History  . Not on file  Social  Needs  . Financial resource strain: Not on file  . Food insecurity    Worry: Not on file    Inability: Not on file  . Transportation needs    Medical: Not on file    Non-medical: Not on file  Tobacco Use  . Smoking status: Never Smoker  . Smokeless tobacco: Never Used  Substance and Sexual Activity  . Alcohol use: No  . Drug use: No  . Sexual activity: Yes    Birth control/protection: None    Comment: had miscarriage may 2013  Lifestyle  . Physical activity    Days per week: Not on file    Minutes per session: Not on file  . Stress: Not on file   Relationships  . Social Herbalist on phone: Not on file    Gets together: Not on file    Attends religious service: Not on file    Active member of club or organization: Not on file    Attends meetings of clubs or organizations: Not on file    Relationship status: Not on file  . Intimate partner violence    Fear of current or ex partner: Not on file    Emotionally abused: Not on file    Physically abused: Not on file    Forced sexual activity: Not on file  Other Topics Concern  . Not on file  Social History Narrative   ** Merged History Encounter **         Current Outpatient Medications:  .  albuterol (PROVENTIL HFA;VENTOLIN HFA) 108 (90 BASE) MCG/ACT inhaler, Inhale 2 puffs into the lungs every 6 (six) hours as needed., Disp: , Rfl:  .  HYDROcodone-acetaminophen (NORCO) 5-325 MG per tablet, 1-2 tabs po q6 hours prn pain, Disp: 40 tablet, Rfl: 0 .  HYDROcodone-acetaminophen (NORCO) 5-325 MG per tablet, 1-2 tabs po q6 hours prn pain, Disp: 40 tablet, Rfl: 0 .  ibuprofen (ADVIL,MOTRIN) 800 MG tablet, Take 1 tablet (800 mg total) by mouth every 8 (eight) hours as needed., Disp: 30 tablet, Rfl: 0 .  metFORMIN (GLUCOPHAGE) 1000 MG tablet, DAY 1 3 TAKE 1 2 TABLET BY MOUTH IN THE MORNING.DAY 4 6 TAKE 1 2 TABLET IN THE MORNING AND 1 2 TABLET IN THE EVENING. DAYS 7 9 TAKE 1 TABLET, Disp: , Rfl:  .  levonorgestrel (MIRENA) 20 MCG/24HR IUD, 1 Intra Uterine Device (1 each total) by Intrauterine route once for 1 dose. To treat endometrial cancer, fertility sparing, Disp: 1 each, Rfl: 0  EXAM:  VITALS per patient if applicable: Ht 5\' 2"  (1.575 m)   Wt 281 lb (127.5 kg)   BMI 51.40 kg/m   GENERAL: alert, oriented, appears well and in no acute distress  HEENT: atraumatic, conjunttiva clear, no obvious abnormalities on inspection of external nose and ears  NECK: normal movements of the head and neck  LUNGS: on inspection no signs of respiratory distress, breathing rate appears  normal, no obvious gross SOB, gasping or wheezing  CV: no obvious cyanosis  MS: moves all visible extremities without noticeable abnormality  PSYCH/NEURO: pleasant and cooperative, no obvious depression or anxiety, speech and thought processing grossly intact  ASSESSMENT AND PLAN:  Discussed the following assessment and plan:  Viral URI Symptoms improving, recommend continued conservative care.  Push fluids and rest.  If continuing to improve she may return to work on 7/20       I discussed the assessment and treatment plan with the patient. The patient  was provided an opportunity to ask questions and all were answered. The patient agreed with the plan and demonstrated an understanding of the instructions.   The patient was advised to call back or seek an in-person evaluation if the symptoms worsen or if the condition fails to improve as anticipated.    Luetta Nutting, DO

## 2018-12-31 ENCOUNTER — Other Ambulatory Visit: Payer: Self-pay

## 2018-12-31 ENCOUNTER — Encounter: Payer: Self-pay | Admitting: Family Medicine

## 2018-12-31 ENCOUNTER — Ambulatory Visit (INDEPENDENT_AMBULATORY_CARE_PROVIDER_SITE_OTHER): Payer: BC Managed Care – PPO | Admitting: Family Medicine

## 2018-12-31 DIAGNOSIS — H6691 Otitis media, unspecified, right ear: Secondary | ICD-10-CM | POA: Diagnosis not present

## 2018-12-31 MED ORDER — CEFDINIR 300 MG PO CAPS: 300.0000 mg | ORAL_CAPSULE | Freq: Two times a day (BID) | ORAL | 0 refills | Status: AC

## 2018-12-31 NOTE — Progress Notes (Signed)
Virtual Visit via Video Note  I connected with Melissa Riggs on 12/31/18 at 10:00 AM EDT by a video enabled telemedicine application and verified that I am speaking with the correct person using two identifiers.  Location patient: home Location provider:work or home office Persons participating in the virtual visit: patient, provider  I discussed the limitations of evaluation and management by telemedicine and the availability of in person appointments. The patient expressed understanding and agreed to proceed.  Telemedicine visit is a necessity given the COVID-19 restrictions in place at the current time.  HPI: 23 y/o female being seen today for ear pain. Ears have felt congested.  The right side started hurting last few days, worse this morning.  She has noted no drainage.  No ear swelling. She did use a bulb syringe to irrigate it a few days ago and it helped for a 1/2 day or so. Tylenol last night and this morning, helping a little.  Right ear pain progressively worsening. No fevers, no cough.   12/23/18 had URI sx's, was seen by Luetta Nutting. All URI sx's have gone away.  She was not having ear problems at that time.    ROS: See pertinent positives and negatives per HPI.  Past Medical History:  Diagnosis Date  . Amenorrhea   . Asthma   . Asthma    when younger, not current problem  . Complication of anesthesia    slow to awaken after tonsils and adenoid surgery yrs ago  . Dysmenorrhea   . Hormone disorder   . Vision abnormalities    wears glasses    Past Surgical History:  Procedure Laterality Date  . ADENOIDECTOMY    . APPENDECTOMY    . CARPAL TUNNEL RELEASE  12/14/2011   Procedure: CARPAL TUNNEL RELEASE;  Surgeon: Tennis Must, MD;  Location: [place];  Service: Orthopedics;  Laterality: Left;  . CARPAL TUNNEL RELEASE  01/28/2012   Procedure: CARPAL TUNNEL RELEASE;  Surgeon: Tennis Must, MD;  Location: [facility];  Service: Orthopedics;   Laterality: Right;  right carpal tunnel release  . CARPAL TUNNEL RELEASE Bilateral   . CATARACT EXTRACTION, BILATERAL    . DILATATION & CURETTAGE/HYSTEROSCOPY WITH MYOSURE N/A 08/15/2018   Procedure: DILATATION & CURETTAGE/HYSTEROSCOPY WITH MYOSURE;  Surgeon: [name], MD;  Location: [facility];  Service: Gynecology;  Laterality: N/A;  endometrial polyp  . DILATION AND CURETTAGE OF UTERUS  for miscarriage this year  . TONSILLECTOMY    . TONSILLECTOMY     and adenoid     Family History  Problem Relation Age of Onset  . Depression Brother   . Drug abuse Brother   . Heart disease Maternal Grandmother   . Hypertension Maternal Grandmother   . Hypertension Paternal Grandmother   . Depression Paternal Grandmother      Current Outpatient Medications:  .  albuterol (PROVENTIL HFA;VENTOLIN HFA) 108 (90 BASE) MCG/ACT inhaler, Inhale 2 puffs into the lungs every 6 (six) hours as needed., Disp: , Rfl:  .  ibuprofen (ADVIL,MOTRIN) 800 MG tablet, Take 1 tablet (800 mg total) by mouth every 8 (eight) hours as needed., Disp: 30 tablet, Rfl: 0 .  levonorgestrel (MIRENA) 20 MCG/24HR IUD, 1 Intra Uterine Device (1 each total) by Intrauterine route once for 1 dose. To treat endometrial cancer, fertility sparing, Disp: 1 each, Rfl: 0 .  metFORMIN (GLUCOPHAGE) 1000 MG tablet, DAY 1 3 TAKE 1 2 TABLET BY MOUTH IN THE MORNING.DAY 4 6  TAKE 1 2 TABLET IN THE MORNING AND 1 2 TABLET IN THE EVENING. DAYS 7 9 TAKE 1 TABLET, Disp: , Rfl:   EXAM:  VITALS per patient if applicable: There were no vitals taken for this visit.   GENERAL: alert, oriented, appears well and in no acute distress  HEENT: atraumatic, conjunttiva clear, no obvious abnormalities on inspection of external nose and ears  NECK: normal movements of the head and neck  LUNGS: on inspection no signs of respiratory distress, breathing rate appears normal, no obvious gross SOB, gasping or wheezing  CV: no obvious  cyanosis  MS: moves all visible extremities without noticeable abnormality  PSYCH/NEURO: pleasant and cooperative, no obvious depression or anxiety, speech and thought processing grossly intact  LABS: none today    Chemistry      Component Value Date/Time   NA 140 03/02/2018 1600   K 3.8 03/02/2018 1600   CL 105 03/02/2018 1600   CO2 25 03/02/2018 1600   BUN 12 03/02/2018 1600   CREATININE 0.85 03/02/2018 1600      Component Value Date/Time   CALCIUM 9.8 03/02/2018 1600   ALKPHOS 64 03/02/2018 1600   AST 18 03/02/2018 1600   ALT 31 03/02/2018 1600   BILITOT 0.3 03/02/2018 1600      ASSESSMENT AND PLAN:  Discussed the following assessment and plan:  Right acute otitis media: intolerance (not allergy) to penicillins. Cefdinir 300 mg bid x5d eRx'd today. Continue tylenol for pain.   I discussed the assessment and treatment plan with the patient. The patient was provided an opportunity to ask questions and all were answered. The patient agreed with the plan and demonstrated an understanding of the instructions.   The patient was advised to call back or seek an in-person evaluation if the symptoms worsen or if the condition fails to improve as anticipated.  F/u: if not improving.  Signed:  [name], MD           12/31/2018

## 2019-01-11 DIAGNOSIS — Z20828 Contact with and (suspected) exposure to other viral communicable diseases: Secondary | ICD-10-CM | POA: Diagnosis not present

## 2019-01-16 ENCOUNTER — Encounter: Payer: Self-pay | Admitting: Family Medicine

## 2019-01-16 ENCOUNTER — Telehealth (INDEPENDENT_AMBULATORY_CARE_PROVIDER_SITE_OTHER): Payer: BC Managed Care – PPO | Admitting: Family Medicine

## 2019-01-16 DIAGNOSIS — Z20822 Contact with and (suspected) exposure to covid-19: Secondary | ICD-10-CM

## 2019-01-16 DIAGNOSIS — Z20828 Contact with and (suspected) exposure to other viral communicable diseases: Secondary | ICD-10-CM | POA: Diagnosis not present

## 2019-01-16 NOTE — Assessment & Plan Note (Signed)
-  Orders entered for COVID testing.  -Discussed recommendations for self isolation until testing results return.  -She should contact clinic for development of symptoms

## 2019-01-16 NOTE — Progress Notes (Signed)
Melissa Riggs - 24 y.o. female MRN [id number]  Date of birth: [birth year]   This visit type was conducted due to national recommendations for restrictions regarding the COVID-19 Pandemic (e.g. social distancing).  This format is felt to be most appropriate for this patient at this time.  All issues noted in this document were discussed and addressed.  No physical exam was performed (except for noted visual exam findings with Video Visits).  I discussed the limitations of evaluation and management by telemedicine and the availability of in person appointments. The patient expressed understanding and agreed to proceed.  I connected with@ on 01/16/19 at  1:15 PM EDT by a video enabled telemedicine application and verified that I am speaking with the correct person using two identifiers.   Patient Location: Home [place] [address]   Provider location:   [facility]  Chief Complaint  Patient presents with  . covid testing    pt brother tested positive for covid(live in same house)/ no symptoms/ pt needs to be tested for work    HPI  Melissa Riggs is a 23 y.o. female who presents via audio/video conferencing for a telehealth visit today.  She reports brother recently tested + for COVID-19 on a rapid test.  He also had a traditional test earlier last week that came back negative.  He has had no symptoms.  She resides in the same house with him and has had close contact.  She does not wear a mask at home.  She denies symptoms at this time.  Her employer is requiring testing as well before she is allowed to return to work.    ROS:  A comprehensive ROS was completed and negative except as noted per HPI  Past Medical History:  Diagnosis Date  . Amenorrhea   . Asthma   . Asthma    when younger, not current problem  . Complication of anesthesia    slow to awaken after tonsils and adenoid surgery yrs ago  . Dysmenorrhea   . Hormone disorder   . Vision  abnormalities    wears glasses    Past Surgical History:  Procedure Laterality Date  . ADENOIDECTOMY    . APPENDECTOMY    . CARPAL TUNNEL RELEASE  12/14/2011   Procedure: CARPAL TUNNEL RELEASE;  Surgeon: Tennis Must, MD;  Location: [place];  Service: Orthopedics;  Laterality: Left;  . CARPAL TUNNEL RELEASE  01/28/2012   Procedure: CARPAL TUNNEL RELEASE;  Surgeon: Tennis Must, MD;  Location: [place] [facility];  Service: Orthopedics;  Laterality: Right;  right carpal tunnel release  . CARPAL TUNNEL RELEASE Bilateral   . CATARACT EXTRACTION, BILATERAL    . DILATATION & CURETTAGE/HYSTEROSCOPY WITH MYOSURE N/A 08/15/2018   Procedure: DILATATION & CURETTAGE/HYSTEROSCOPY WITH MYOSURE;  Surgeon: [name], MD;  Location: [facility];  Service: Gynecology;  Laterality: N/A;  endometrial polyp  . DILATION AND CURETTAGE OF UTERUS  for miscarriage this year  . TONSILLECTOMY    . TONSILLECTOMY     and adenoid     Family History  Problem Relation Age of Onset  . Depression Brother   . Drug abuse Brother   . Heart disease Maternal Grandmother   . Hypertension Maternal Grandmother   . Hypertension Paternal Grandmother   . Depression Paternal Grandmother     Social History   Socioeconomic History  . Marital status: Married    Spouse name: Not on file  .  Number of children: Not on file  . Years of education: Not on file  . Highest education level: Not on file  Occupational History  . Not on file  Social Needs  . Financial resource strain: Not on file  . Food insecurity    Worry: Not on file    Inability: Not on file  . Transportation needs    Medical: Not on file    Non-medical: Not on file  Tobacco Use  . Smoking status: Never Smoker  . Smokeless tobacco: Never Used  Substance and Sexual Activity  . Alcohol use: No  . Drug use: No  . Sexual activity: Yes    Birth control/protection: None    Comment: had miscarriage may 2013   Lifestyle  . Physical activity    Days per week: Not on file    Minutes per session: Not on file  . Stress: Not on file  Relationships  . Social Herbalist on phone: Not on file    Gets together: Not on file    Attends religious service: Not on file    Active member of club or organization: Not on file    Attends meetings of clubs or organizations: Not on file    Relationship status: Not on file  . Intimate partner violence    Fear of current or ex partner: Not on file    Emotionally abused: Not on file    Physically abused: Not on file    Forced sexual activity: Not on file  Other Topics Concern  . Not on file  Social History Narrative   ** Merged History Encounter **         Current Outpatient Medications:  .  albuterol (PROVENTIL HFA;VENTOLIN HFA) 108 (90 BASE) MCG/ACT inhaler, Inhale 2 puffs into the lungs every 6 (six) hours as needed., Disp: , Rfl:  .  ibuprofen (ADVIL,MOTRIN) 800 MG tablet, Take 1 tablet (800 mg total) by mouth every 8 (eight) hours as needed., Disp: 30 tablet, Rfl: 0 .  metFORMIN (GLUCOPHAGE) 1000 MG tablet, DAY 1 3 TAKE 1 2 TABLET BY MOUTH IN THE MORNING.DAY 4 6 TAKE 1 2 TABLET IN THE MORNING AND 1 2 TABLET IN THE EVENING. DAYS 7 9 TAKE 1 TABLET, Disp: , Rfl:  .  levonorgestrel (MIRENA) 20 MCG/24HR IUD, 1 Intra Uterine Device (1 each total) by Intrauterine route once for 1 dose. To treat endometrial cancer, fertility sparing, Disp: 1 each, Rfl: 0  EXAM:  VITALS per patient if applicable: There were no vitals taken for this visit.  GENERAL: alert, oriented, appears well and in no acute distress  HEENT: atraumatic, conjunttiva clear, no obvious abnormalities on inspection of external nose and ears  NECK: normal movements of the head and neck  LUNGS: on inspection no signs of respiratory distress, breathing rate appears normal, no obvious gross SOB, gasping or wheezing  CV: no obvious cyanosis  MS: moves all visible extremities without  noticeable abnormality  PSYCH/NEURO: pleasant and cooperative, no obvious depression or anxiety, speech and thought processing grossly intact  ASSESSMENT AND PLAN:  Discussed the following assessment and plan:  Exposure to Covid-19 Virus -Orders entered for COVID testing.  -Discussed recommendations for self isolation until testing results return.  -She should contact clinic for development of symptoms        I discussed the assessment and treatment plan with the patient. The patient was provided an opportunity to ask questions and all were answered. The patient agreed  with the plan and demonstrated an understanding of the instructions.   The patient was advised to call back or seek an in-person evaluation if the symptoms worsen or if the condition fails to improve as anticipated.     Luetta Nutting, DO

## 2019-01-19 ENCOUNTER — Other Ambulatory Visit: Payer: Self-pay

## 2019-01-19 DIAGNOSIS — Z20822 Contact with and (suspected) exposure to covid-19: Secondary | ICD-10-CM

## 2019-01-21 LAB — NOVEL CORONAVIRUS, NAA: SARS-CoV-2, NAA: NOT DETECTED

## 2019-01-21 LAB — SPECIMEN STATUS REPORT

## 2019-03-03 ENCOUNTER — Encounter: Payer: Self-pay | Admitting: Gynecologic Oncology

## 2019-03-03 ENCOUNTER — Other Ambulatory Visit: Payer: Self-pay

## 2019-03-03 ENCOUNTER — Inpatient Hospital Stay: Payer: BC Managed Care – PPO | Attending: Gynecologic Oncology | Admitting: Gynecologic Oncology

## 2019-03-03 VITALS — BP 128/60 | HR 81 | Temp 98.5°F | Resp 18 | Ht 62.0 in | Wt 283.0 lb

## 2019-03-03 DIAGNOSIS — Z975 Presence of (intrauterine) contraceptive device: Secondary | ICD-10-CM | POA: Insufficient documentation

## 2019-03-03 DIAGNOSIS — Z793 Long term (current) use of hormonal contraceptives: Secondary | ICD-10-CM | POA: Insufficient documentation

## 2019-03-03 DIAGNOSIS — C541 Malignant neoplasm of endometrium: Secondary | ICD-10-CM | POA: Diagnosis not present

## 2019-03-03 DIAGNOSIS — Z7984 Long term (current) use of oral hypoglycemic drugs: Secondary | ICD-10-CM | POA: Diagnosis not present

## 2019-03-03 DIAGNOSIS — E282 Polycystic ovarian syndrome: Secondary | ICD-10-CM | POA: Diagnosis not present

## 2019-03-03 NOTE — Patient Instructions (Signed)
If today's biopsy shows cancer we will discuss options.  If it shows no cancer, options are to continue the IUD for 5 years, or to remove it and try for pregnancy.

## 2019-03-03 NOTE — Progress Notes (Signed)
Follow-up Note: Gyn-Onc  Consult was initially requested by Dr. Talbert Nan for the evaluation of Melissa Riggs 23 y.o. female  CC:  Chief Complaint  Patient presents with  . endometrial cancer    Assessment/Plan:  Melissa Riggs  is a 23 y.o.  year old with a grade 1 endometrial cancer.  She desires fertility and has primary infertility likely secondary to annovulation from PCOS.  S/p IUD in March, 2020 (Mirena, progestin releasing).  If no malignancy found on sampling today, plan is to remove the IUD if she desires plans to conceive, or keep IUD for now until 5 years have passed.  If malignancy is persistent she will decide if she wants to continue progestins (might add oral megace) or proceed with hysterectomy and definitive management.  Continue metformin.  HPI: Melissa Riggs is a 23 year old P0 who is seen in consultation at the request of Dr Talbert Nan for a grade 1 endometrial cancer.  The patient has had primary infertility and amenorrhea for 3 to 4 years.  She has a lifelong history of oligo menorrhagia.  Due to failed attempts at pregnancy she was seen by her OB/GYN physician, Dr. Talbert Nan, who performed a transvaginal ultrasound scan on April 12, 2018.  This showed a uterus measuring 6.4 x 3.7 x 2.8 cm.  The endometrium measured 7.6 mm.  The left and right ovaries were slightly enlarged in volume but did not contain dominant cyst.  They contain multiple tiny follicles around the periphery suggestive of PCOS.  The endometrium contained a 1.5 cm echogenic endometrial mass posteriorly.  She was taken to the operating room on August 15, 2022 hysteroscopy, polypectomy, D&C.  Intraoperative findings were significant for a sessile polyp on the posterior uterine wall.  Slightly atypical appearance.  Thin endometrium.  Normal tubal ostia bilaterally.  The polyp was removed as were endometrial curettings.  The polyp revealed grade 1 endometrioid adenocarcinoma.   The endometrial curettage revealed a scant fragment of endometrioid adenocarcinoma.  The patient strongly desires fertility.  She has a history of an appendectomy as her only prior abdominal surgery.  Her other medical conditions include history of cataracts which were removed, and carpal tunnel surgery.  Her family history is unremarkable for malignancies.  She saw a reproductive endocrinologist and was recommended to start Metformin.  Plan was put in place for progesterone therapy until confirmed resolution of the cancer and then removal of IUD and attempted pregnancy.   MRI was performed on 08/24/18 which showed a thin endometrium (3 mm). No discrete endometrial mass. No pelvic adenopathy. FIGO stage IA by MRI.  The patient received a Mirena progestin-releasing IUD on 09/06/18.   Interval Hx:  Since receiving the IUD she has had no bleeding symptoms. She is beginning to be uncertain regarding her desire to preserve fertility as she recognizes that pregnancy is not guaranteed even if the cancer resolves. Her biopsy (endometrial) in 12/02/18 showed benign inactive endometrium.  Current Meds:  Outpatient Encounter Medications as of 03/03/2019  Medication Sig  . albuterol (PROVENTIL HFA;VENTOLIN HFA) 108 (90 BASE) MCG/ACT inhaler Inhale 2 puffs into the lungs every 6 (six) hours as needed.  Marland Kitchen ibuprofen (ADVIL,MOTRIN) 800 MG tablet Take 1 tablet (800 mg total) by mouth every 8 (eight) hours as needed.  Marland Kitchen levonorgestrel (MIRENA) 20 MCG/24HR IUD 1 Intra Uterine Device (1 each total) by Intrauterine route once for 1 dose. To treat endometrial cancer, fertility sparing  . metFORMIN (GLUCOPHAGE) 1000 MG tablet DAY 1  3 TAKE 1 2 TABLET BY MOUTH IN THE MORNING.DAY 4 6 TAKE 1 2 TABLET IN THE MORNING AND 1 2 TABLET IN THE EVENING. DAYS 7 9 TAKE 1 TABLET   No facility-administered encounter medications on file as of 03/03/2019.     Allergy:  Allergies  Allergen Reactions  . Betadine [Povidone Iodine]  Itching    Rxn. After last surgery, prepped with betadine.  . Amoxicillin Diarrhea, Nausea And Vomiting and Other (See Comments)    dehydration  . Amoxicillin Diarrhea  . Penicillins Diarrhea, Nausea And Vomiting and Other (See Comments)    Dehydration Has patient had a PCN reaction causing immediate rash, facial/tongue/throat swelling, SOB or lightheadedness with hypotension: No Has patient had a PCN reaction causing severe rash involving mucus membranes or skin necrosis: No Has patient had a PCN reaction that required hospitalization: Yes Has patient had a PCN reaction occurring within the last 10 years: Yes If all of the above answers are "NO", then may proceed with Cephalosporin use.    Social Hx:   Social History   Socioeconomic History  . Marital status: Married    Spouse name: Not on file  . Number of children: Not on file  . Years of education: Not on file  . Highest education level: Not on file  Occupational History  . Not on file  Social Needs  . Financial resource strain: Not on file  . Food insecurity    Worry: Not on file    Inability: Not on file  . Transportation needs    Medical: Not on file    Non-medical: Not on file  Tobacco Use  . Smoking status: Never Smoker  . Smokeless tobacco: Never Used  Substance and Sexual Activity  . Alcohol use: No  . Drug use: No  . Sexual activity: Yes    Birth control/protection: None    Comment: had miscarriage may 2013  Lifestyle  . Physical activity    Days per week: Not on file    Minutes per session: Not on file  . Stress: Not on file  Relationships  . Social Herbalist on phone: Not on file    Gets together: Not on file    Attends religious service: Not on file    Active member of club or organization: Not on file    Attends meetings of clubs or organizations: Not on file    Relationship status: Not on file  . Intimate partner violence    Fear of current or ex partner: Not on file    Emotionally  abused: Not on file    Physically abused: Not on file    Forced sexual activity: Not on file  Other Topics Concern  . Not on file  Social History Narrative   ** Merged History Encounter **        Past Surgical Hx:  Past Surgical History:  Procedure Laterality Date  . ADENOIDECTOMY    . APPENDECTOMY    . CARPAL TUNNEL RELEASE  12/14/2011   Procedure: CARPAL TUNNEL RELEASE;  Surgeon: Tennis Must, MD;  Location: [place];  Service: Orthopedics;  Laterality: Left;  . CARPAL TUNNEL RELEASE  01/28/2012   Procedure: CARPAL TUNNEL RELEASE;  Surgeon: Tennis Must, MD;  Location: [place];  Service: Orthopedics;  Laterality: Right;  right carpal tunnel release  . CARPAL TUNNEL RELEASE Bilateral   . CATARACT EXTRACTION, BILATERAL    . DILATATION & CURETTAGE/HYSTEROSCOPY WITH  MYOSURE N/A 08/15/2018   Procedure: DILATATION & CURETTAGE/HYSTEROSCOPY WITH MYOSURE;  Surgeon: [name], MD;  Location: [facility];  Service: Gynecology;  Laterality: N/A;  endometrial polyp  . DILATION AND CURETTAGE OF UTERUS  for miscarriage this year  . TONSILLECTOMY    . TONSILLECTOMY     and adenoid     Past Medical Hx:  Past Medical History:  Diagnosis Date  . Amenorrhea   . Asthma   . Asthma    when younger, not current problem  . Complication of anesthesia    slow to awaken after tonsils and adenoid surgery yrs ago  . Dysmenorrhea   . Hormone disorder   . Vision abnormalities    wears glasses    Past Gynecological History:  G0, see HPI No LMP recorded. (Menstrual status: IUD).  Family Hx:  Family History  Problem Relation Age of Onset  . Depression Brother   . Drug abuse Brother   . Heart disease Maternal Grandmother   . Hypertension Maternal Grandmother   . Hypertension Paternal Grandmother   . Depression Paternal Grandmother     Review of Systems:  Constitutional  Feels well,    ENT Normal appearing ears and nares  bilaterally Skin/Breast  No rash, sores, jaundice, itching, dryness Cardiovascular  No chest pain, shortness of breath, or edema  Pulmonary  No cough or wheeze.  Gastro Intestinal  No nausea, vomitting, or diarrhoea. No bright red blood per rectum, no abdominal pain, change in bowel movement, or constipation.  Genito Urinary  No frequency, urgency, dysuria, amenorrhea Musculo Skeletal  No myalgia, arthralgia, joint swelling or pain  Neurologic  No weakness, numbness, change in gait,  Psychology  No depression, anxiety, insomnia.   Vitals:  Blood pressure 128/60, pulse 81, temperature 98.5 F (36.9 C), temperature source Temporal, resp. rate 18, weight 283 lb (128.4 kg), SpO2 98 %.  Physical Exam: WD in NAD Neck  Supple NROM, without any enlargements.  Lymph Node Survey No cervical supraclavicular or inguinal adenopathy Cardiovascular  Pulse normal rate, regularity and rhythm. S1 and S2 normal.  Lungs  Clear to auscultation bilateraly, without wheezes/crackles/rhonchi. Good air movement.  Skin  Pigmented changes to skin on thighs. Increased hair on abdomen.  Psychiatry  Alert and oriented to person, place, and time  Abdomen  Normoactive bowel sounds, abdomen soft, non-tender and obese without evidence of hernia.  Back No CVA tenderness Genito Urinary  Vulva/vagina: Normal external female genitalia.  No lesions. No discharge or bleeding.  Bladder/urethra:  No lesions or masses, well supported bladder  Vagina: normal  Cervix: Normal appearing, no lesions.  Uterus:  Small, mobile, no parametrial involvement or nodularity.  Adnexa: no discrete masses. Rectal  deferred  Extremities  No bilateral cyanosis, clubbing or edema.   Procedure Note:  Preop Dx: endometrial cancer, progestin releasing IUD treatment Postop Dx: same Procedure: endometrial biopsy Surgeon: Dorann Ou, MD EBL: minimal Specimens: endometrial biopsy for pathology Complications: none Procedure  Details: Patient provided verbal consent and she also provided written consent.  The procedure was explained.  She was placed in the lithotomy position and speculum was inserted into the vagina.  The cervix was grasped with a single-tooth tenaculum.  The endometrial Pipelle was inserted to a depth of 7 cm.  It was aspirated.  There was a small amount of tissue that was retrieved.  The IUD strings were seen from the cervix both before and after completion of the procedure which represented nondisplacement of the  IUD.  Hemostasis was achieved at the tenaculum sites with silver nitrate.  The patient tolerated the procedure well and the specimen sent for histopathology.   Thereasa Solo, MD  03/03/2019, 2:38 PM

## 2019-03-06 LAB — SURGICAL PATHOLOGY

## 2019-03-07 ENCOUNTER — Telehealth: Payer: Self-pay

## 2019-03-07 NOTE — Telephone Encounter (Signed)
I spoke with Melissa Riggs. I let her know her biopsy was not cancerous or precancerous.  I told her she could get the IUD removed if she chose. She said that she was going to a fertility specialist and will decide after that.

## 2019-03-21 ENCOUNTER — Telehealth: Payer: Self-pay | Admitting: *Deleted

## 2019-03-21 NOTE — Telephone Encounter (Signed)
Per patient request, fax records to [facility].

## 2019-04-12 DIAGNOSIS — Z3161 Procreative counseling and advice using natural family planning: Secondary | ICD-10-CM | POA: Diagnosis not present

## 2019-04-12 DIAGNOSIS — N970 Female infertility associated with anovulation: Secondary | ICD-10-CM | POA: Diagnosis not present

## 2019-04-12 DIAGNOSIS — E282 Polycystic ovarian syndrome: Secondary | ICD-10-CM | POA: Diagnosis not present

## 2019-05-10 ENCOUNTER — Other Ambulatory Visit: Payer: Self-pay

## 2019-05-10 DIAGNOSIS — Z20822 Contact with and (suspected) exposure to covid-19: Secondary | ICD-10-CM

## 2019-05-12 LAB — NOVEL CORONAVIRUS, NAA: SARS-CoV-2, NAA: NOT DETECTED

## 2019-05-25 ENCOUNTER — Encounter: Payer: Self-pay | Admitting: Obstetrics and Gynecology

## 2019-05-25 ENCOUNTER — Other Ambulatory Visit: Payer: Self-pay

## 2019-05-25 ENCOUNTER — Telehealth: Payer: Self-pay | Admitting: Obstetrics and Gynecology

## 2019-05-25 ENCOUNTER — Ambulatory Visit (INDEPENDENT_AMBULATORY_CARE_PROVIDER_SITE_OTHER): Payer: BC Managed Care – PPO | Admitting: Obstetrics and Gynecology

## 2019-05-25 VITALS — BP 132/88 | HR 88 | Temp 98.2°F | Wt 281.4 lb

## 2019-05-25 DIAGNOSIS — R1909 Other intra-abdominal and pelvic swelling, mass and lump: Secondary | ICD-10-CM

## 2019-05-25 NOTE — Progress Notes (Signed)
GYNECOLOGY  VISIT   HPI: 23 y.o.   Married   Other or two or more races Unavailable Hispanic or Latino  female   No obstetric history on file. with No LMP recorded (lmp unknown). (Menstrual status: IUD).   here for bump right above panty line that is the size of a quarter. She noticed it last night. Slight discomfort, tender with palpation. Denies any redness, draining, or warmth to the site.    H/O endometrial adenocarcinoma has a mirena IUD in to preserve fertility. She is seeing Dr Kerin Perna for fertility. She has been started on metformin. They are discussing options of IUI or IVF.   GYNECOLOGIC HISTORY: No LMP recorded (lmp unknown). (Menstrual status: IUD). Contraception: IUD Menopausal hormone therapy: None        OB History    Gravida  0   Para  0   Term  0   Preterm  0   AB  0   Living  0     SAB  0   TAB  0   Ectopic  0   Multiple  0   Live Births  0              Patient Active Problem List   Diagnosis Date Noted  . Viral URI 12/23/2018  . Exposure to COVID-19 virus 11/18/2018  . Suspected COVID-19 virus infection 11/18/2018  . Hypothyroidism 03/02/2018  . Anovulation 03/02/2018    Past Medical History:  Diagnosis Date  . Amenorrhea   . Asthma   . Asthma    when younger, not current problem  . Complication of anesthesia    slow to awaken after tonsils and adenoid surgery yrs ago  . Dysmenorrhea   . Hormone disorder   . Vision abnormalities    wears glasses    Past Surgical History:  Procedure Laterality Date  . ADENOIDECTOMY    . APPENDECTOMY    . CARPAL TUNNEL RELEASE  12/14/2011   Procedure: CARPAL TUNNEL RELEASE;  Surgeon: Tennis Must, MD;  Location: [place];  Service: Orthopedics;  Laterality: Left;  . CARPAL TUNNEL RELEASE  01/28/2012   Procedure: CARPAL TUNNEL RELEASE;  Surgeon: Tennis Must, MD;  Location: [place];  Service: Orthopedics;  Laterality: Right;  right carpal tunnel release  .  CARPAL TUNNEL RELEASE Bilateral   . CATARACT EXTRACTION, BILATERAL    . DILATATION & CURETTAGE/HYSTEROSCOPY WITH MYOSURE N/A 08/15/2018   Procedure: DILATATION & CURETTAGE/HYSTEROSCOPY WITH MYOSURE;  Surgeon: [name], MD;  Location: [facility];  Service: Gynecology;  Laterality: N/A;  endometrial polyp  . DILATION AND CURETTAGE OF UTERUS  for miscarriage this year  . TONSILLECTOMY    . TONSILLECTOMY     and adenoid     Current Outpatient Medications  Medication Sig Dispense Refill  . albuterol (PROVENTIL HFA;VENTOLIN HFA) 108 (90 BASE) MCG/ACT inhaler Inhale 2 puffs into the lungs every 6 (six) hours as needed.    Marland Kitchen ibuprofen (ADVIL,MOTRIN) 800 MG tablet Take 1 tablet (800 mg total) by mouth every 8 (eight) hours as needed. 30 tablet 0  . metFORMIN (GLUCOPHAGE) 1000 MG tablet Take 1,000 mg by mouth 2 (two) times daily.    Marland Kitchen levonorgestrel (MIRENA) 20 MCG/24HR IUD 1 Intra Uterine Device (1 each total) by Intrauterine route once for 1 dose. To treat endometrial cancer, fertility sparing 1 each 0   No current facility-administered medications for this visit.     ALLERGIES: Betadine [  povidone iodine], Amoxicillin, Amoxicillin, and Penicillins  Family History  Problem Relation Age of Onset  . Depression Brother   . Drug abuse Brother   . Heart disease Maternal Grandmother   . Hypertension Maternal Grandmother   . Hypertension Paternal Grandmother   . Depression Paternal Grandmother     Social History   Socioeconomic History  . Marital status: Married    Spouse name: Not on file  . Number of children: Not on file  . Years of education: Not on file  . Highest education level: Not on file  Occupational History  . Not on file  Tobacco Use  . Smoking status: Never Smoker  . Smokeless tobacco: Never Used  Substance and Sexual Activity  . Alcohol use: No  . Drug use: No  . Sexual activity: Yes    Birth control/protection: I.U.D.    Comment: had  miscarriage may 2013  Other Topics Concern  . Not on file  Social History Narrative   ** Merged History Encounter **       Social Determinants of Health   Financial Resource Strain:   . Difficulty of Paying Living Expenses: Not on file  Food Insecurity:   . Worried About Charity fundraiser in the Last Year: Not on file  . Ran Out of Food in the Last Year: Not on file  Transportation Needs:   . Lack of Transportation (Medical): Not on file  . Lack of Transportation (Non-Medical): Not on file  Physical Activity:   . Days of Exercise per Week: Not on file  . Minutes of Exercise per Session: Not on file  Stress:   . Feeling of Stress : Not on file  Social Connections:   . Frequency of Communication with Friends and Family: Not on file  . Frequency of Social Gatherings with Friends and Family: Not on file  . Attends Religious Services: Not on file  . Active Member of Clubs or Organizations: Not on file  . Attends Archivist Meetings: Not on file  . Marital Status: Not on file  Intimate Partner Violence:   . Fear of Current or Ex-Partner: Not on file  . Emotionally Abused: Not on file  . Physically Abused: Not on file  . Sexually Abused: Not on file    Review of Systems  Constitutional: Negative.   HENT: Negative.   Eyes: Negative.   Respiratory: Negative.   Cardiovascular: Negative.   Gastrointestinal: Negative.   Genitourinary:       Bump in right groin  Musculoskeletal: Negative.   Skin: Negative.   Neurological: Negative.   Endo/Heme/Allergies: Negative.   Psychiatric/Behavioral: Negative.     PHYSICAL EXAMINATION:    BP 132/88 (BP Location: Right Arm, Patient Position: Sitting, Cuff Size: Large)   Pulse 88   Temp 98.2 F (36.8 C) (Skin)   Wt 281 lb 6.4 oz (127.6 kg)   LMP  (LMP Unknown)   BMI 51.47 kg/m     General appearance: alert, cooperative and appears stated age Abdomen: soft, tender, not reducible lump in the right inguinal region, ~3  cm. No other abdominal tenderness.    ASSESSMENT Suspected inguinal hernia    PLAN Referral to general surgery   An After Visit Summary was printed and given to the patient.

## 2019-05-25 NOTE — Patient Instructions (Signed)
Inguinal Hernia, Adult °An inguinal hernia develops when fat or the intestines push through a weak spot in a muscle where your leg meets your lower abdomen (groin). This creates a bulge. This kind of hernia could also be: °· In your scrotum, if you are female. °· In folds of skin around your vagina, if you are female. °There are three types of inguinal hernias: °· Hernias that can be pushed back into the abdomen (are reducible). This type rarely causes pain. °· Hernias that are not reducible (are incarcerated). °· Hernias that are not reducible and lose their blood supply (are strangulated). This type of hernia requires emergency surgery. °What are the causes? °This condition is caused by having a weak spot in the muscles or tissues in the groin. This weak spot develops over time. The hernia may poke through the weak spot when you suddenly strain your lower abdominal muscles, such as when you: °· Lift a heavy object. °· Strain to have a bowel movement. Constipation can lead to straining. °· Cough. °What increases the risk? °This condition is more likely to develop in: °· Men. °· Pregnant women. °· People who: °? Are overweight. °? Work in jobs that require long periods of standing or heavy lifting. °? Have had an inguinal hernia before. °? Smoke or have lung disease. These factors can lead to long-lasting (chronic) coughing. °What are the signs or symptoms? °Symptoms may depend on the size of the hernia. Often, a small inguinal hernia has no symptoms. Symptoms of a larger hernia may include: °· A lump in the groin area. This is easier to see when standing. It might not be visible when lying down. °· Pain or burning in the groin. This may get worse when lifting, straining, or coughing. °· A dull ache or a feeling of pressure in the groin. °· In men, an unusual lump in the scrotum. °Symptoms of a strangulated inguinal hernia may include: °· A bulge in your groin that is very painful and tender to the touch. °· A bulge  that turns red or purple. °· Fever, nausea, and vomiting. °· Inability to have a bowel movement or to pass gas. °How is this diagnosed? °This condition is diagnosed based on your symptoms, your medical history, and a physical exam. Your health care provider may feel your groin area and ask you to cough. °How is this treated? °Treatment depends on the size of your hernia and whether you have symptoms. If you do not have symptoms, your health care provider may have you watch your hernia carefully and have you come in for follow-up visits. If your hernia is large or if you have symptoms, you may need surgery to repair the hernia. °Follow these instructions at home: °Lifestyle °· Avoid lifting heavy objects. °· Avoid standing for long periods of time. °· Do not use any products that contain nicotine or tobacco, such as cigarettes and e-cigarettes. If you need help quitting, ask your health care provider. °· Maintain a healthy weight. °Preventing constipation °· Take actions to prevent constipation. Constipation leads to straining with bowel movements, which can make a hernia worse or cause a hernia repair to break down. Your health care provider may recommend that you: °? Drink enough fluid to keep your urine pale yellow. °? Eat foods that are high in fiber, such as fresh fruits and vegetables, whole grains, and beans. °? Limit foods that are high in fat and processed sugars, such as fried or sweet foods. °? Take an over-the-counter   or prescription medicine for constipation. °General instructions °· You may try to push the hernia back in place by very gently pressing on it while lying down. Do not try to force the bulge back in if it will not push in easily. °· Watch your hernia for any changes in shape, size, or color. Get help right away if you notice any changes. °· Take over-the-counter and prescription medicines only as told by your health care provider. °· Keep all follow-up visits as told by your health care  provider. This is important. °Contact a health care provider if: °· You have a fever. °· You develop new symptoms. °· Your symptoms get worse. °Get help right away if: °· You have pain in your groin that suddenly gets worse. °· You have a bulge in your groin that: °? Suddenly gets bigger and does not get smaller. °? Becomes red or purple or painful to the touch. °· You are a man and you have a sudden pain in your scrotum, or the size of your scrotum suddenly changes. °· You cannot push the hernia back in place by very gently pressing on it when you are lying down. Do not try to force the bulge back in if it will not push in easily. °· You have nausea or vomiting that does not go away. °· You have a fast heartbeat. °· You cannot have a bowel movement or pass gas. °These symptoms may represent a serious problem that is an emergency. Do not wait to see if the symptoms will go away. Get medical help right away. Call your local emergency services (911 in the [place] °Summary °· An inguinal hernia develops when fat or the intestines push through a weak spot in a muscle where your leg meets your lower abdomen (groin). °· This condition is caused by having a weak spot in muscles or tissue in your groin. °· Symptoms may depend on the size of the hernia, and they may include pain or swelling in your groin. A small inguinal hernia often has no symptoms. °· Treatment may not be needed if you do not have symptoms. If you have symptoms or a large hernia, you may need surgery to repair the hernia. °· Avoid lifting heavy objects. Also avoid standing for long amounts of time. °This information is not intended to replace advice given to you by your health care provider. Make sure you discuss any questions you have with your health care provider. °Document Released: 10/11/2008 Document Revised: 06/26/2017 Document Reviewed: 02/24/2017 °Elsevier Patient Education © 2020 [employer] ° °

## 2019-05-25 NOTE — Telephone Encounter (Signed)
Patient says she has something that feels like a "mass" near her panty line.

## 2019-05-25 NOTE — Telephone Encounter (Signed)
Spoke with patient. Patient reports a "lime" size lump in pelvic area, sore to touch. Denies redness, drainage, warmth, fever/chills. Noticed it last night when showering. Patient has hx of endometrial cancer and is anxious, requesting OV today. Last seen by Dr. Denman George 03/03/19, states she was advised she could resume care with her GYN. She does have a IUD in place.   Covid 19 prescreen completed, precautions reviewed. Patient was tested for Covid19 on 05/10/19 due to "runny nose", testing was negative. Patient denies any symptoms or recent exposures.   OV scheduled for OV today at 3pm with Dr. Talbert Nan.   Routing to provider for final review. Patient is agreeable to disposition. Will close encounter.

## 2019-05-29 ENCOUNTER — Telehealth: Payer: Self-pay | Admitting: Obstetrics and Gynecology

## 2019-05-29 NOTE — Telephone Encounter (Signed)
Patient is calling to follow up on referral to [facility].

## 2019-05-30 NOTE — Telephone Encounter (Signed)
Returning call to patient unable to leave a message. No voicemail setup.

## 2019-05-30 NOTE — Telephone Encounter (Signed)
Left voicemail regarding referral appointment. The information is listed below. Should the patient need to cancel or reschedule this appointment, Please advise them to call the office they've been referred to in order to reschedule.  Melissa Riggs 06/07/19 @ 3:10 pm. Please arrive 15 minutes early and bring your insurance card and photo id and list of medications.  [facility] [address] [place]  [place], [place] [address] [phone]

## 2019-06-07 DIAGNOSIS — R1909 Other intra-abdominal and pelvic swelling, mass and lump: Secondary | ICD-10-CM | POA: Diagnosis not present

## 2019-06-08 ENCOUNTER — Other Ambulatory Visit: Payer: Self-pay | Admitting: Surgery

## 2019-06-08 DIAGNOSIS — R1909 Other intra-abdominal and pelvic swelling, mass and lump: Secondary | ICD-10-CM

## 2019-06-19 ENCOUNTER — Ambulatory Visit
Admission: RE | Admit: 2019-06-19 | Discharge: 2019-06-19 | Disposition: A | Discharge status: Home / Self Care | Payer: BC Managed Care – PPO | Source: Ambulatory Visit | Attending: Surgery | Admitting: Surgery

## 2019-06-19 ENCOUNTER — Other Ambulatory Visit: Payer: Self-pay

## 2019-06-19 DIAGNOSIS — K760 Fatty (change of) liver, not elsewhere classified: Secondary | ICD-10-CM | POA: Diagnosis not present

## 2019-06-19 DIAGNOSIS — R1909 Other intra-abdominal and pelvic swelling, mass and lump: Secondary | ICD-10-CM

## 2019-06-19 DIAGNOSIS — K4090 Unilateral inguinal hernia, without obstruction or gangrene, not specified as recurrent: Secondary | ICD-10-CM | POA: Diagnosis not present

## 2019-06-19 MED ORDER — IOPAMIDOL (ISOVUE-300) INJECTION 61%
125.0000 mL | Freq: Once | INTRAVENOUS | Status: AC | PRN
  Administered 2019-06-19: 125 mL via INTRAVENOUS

## 2019-08-04 ENCOUNTER — Ambulatory Visit: Payer: BC Managed Care – PPO | Attending: Internal Medicine

## 2019-08-04 DIAGNOSIS — Z20822 Contact with and (suspected) exposure to covid-19: Secondary | ICD-10-CM

## 2019-08-05 LAB — NOVEL CORONAVIRUS, NAA: SARS-CoV-2, NAA: NOT DETECTED

## 2019-08-07 ENCOUNTER — Emergency Department (HOSPITAL_COMMUNITY)
Admission: EM | Admit: 2019-08-07 | Discharge: 2019-08-07 | Disposition: A | Discharge status: Home / Self Care | Payer: BC Managed Care – PPO | Attending: Emergency Medicine | Admitting: Emergency Medicine

## 2019-08-07 ENCOUNTER — Encounter (HOSPITAL_COMMUNITY): Payer: Self-pay | Admitting: Emergency Medicine

## 2019-08-07 ENCOUNTER — Other Ambulatory Visit: Payer: Self-pay

## 2019-08-07 DIAGNOSIS — Z5321 Procedure and treatment not carried out due to patient leaving prior to being seen by health care provider: Secondary | ICD-10-CM | POA: Insufficient documentation

## 2019-08-07 DIAGNOSIS — R0981 Nasal congestion: Secondary | ICD-10-CM | POA: Diagnosis not present

## 2019-08-07 NOTE — ED Notes (Signed)
Pt left AMA. Pt was aware of risks and was encouraged to stay. Pt stated she was going to go to a store and get something for her ears.

## 2019-08-07 NOTE — ED Triage Notes (Signed)
Patient reports nasal congestion/rhinorrhea and bilateral ear ache this week unrelieved by OTC medications , denies fever/respirations unlabored .

## 2019-08-21 ENCOUNTER — Encounter: Payer: Self-pay | Admitting: Certified Nurse Midwife

## 2019-08-23 ENCOUNTER — Encounter: Payer: Self-pay | Admitting: Certified Nurse Midwife

## 2019-10-18 NOTE — Progress Notes (Signed)
GYNECOLOGY  VISIT   HPI: 24 y.o.   Married   Other or two or more races Unavailable Hispanic or Latino  female   G0P0000 with No LMP recorded. (Menstrual status: IUD).   here for discharge. Patient states that she has had irritation since Sunday, 10/15/19. Per patient, tried Monistat but had no relief. Per patient has a "brownish" discharge, itching, and burning. Symptoms started on Sunday, on Tuesday she started monistat (one day). She still feels burning and itching. No odor. She has a light brown vaginal d/c.   GYNECOLOGIC HISTORY: No LMP recorded. (Menstrual status: IUD). Contraception:Mirena IUD Menopausal hormone therapy: n/a        OB History    Gravida  0   Para  0   Term  0   Preterm  0   AB  0   Living  0     SAB  0   TAB  0   Ectopic  0   Multiple  0   Live Births  0              Patient Active Problem List   Diagnosis Date Noted  . Viral URI 12/23/2018  . Exposure to COVID-19 virus 11/18/2018  . Suspected COVID-19 virus infection 11/18/2018  . Hypothyroidism 03/02/2018  . Anovulation 03/02/2018    Past Medical History:  Diagnosis Date  . Amenorrhea   . Asthma   . Asthma    when younger, not current problem  . Complication of anesthesia    slow to awaken after tonsils and adenoid surgery yrs ago  . Dysmenorrhea   . Hormone disorder   . Vision abnormalities    wears glasses    Past Surgical History:  Procedure Laterality Date  . ADENOIDECTOMY    . APPENDECTOMY    . CARPAL TUNNEL RELEASE  12/14/2011   Procedure: CARPAL TUNNEL RELEASE;  Surgeon: Tennis Must, MD;  Location: [place];  Service: Orthopedics;  Laterality: Left;  . CARPAL TUNNEL RELEASE  01/28/2012   Procedure: CARPAL TUNNEL RELEASE;  Surgeon: Tennis Must, MD;  Location: [place];  Service: Orthopedics;  Laterality: Right;  right carpal tunnel release  . CARPAL TUNNEL RELEASE Bilateral   . CATARACT EXTRACTION, BILATERAL    . DILATATION &  CURETTAGE/HYSTEROSCOPY WITH MYOSURE N/A 08/15/2018   Procedure: DILATATION & CURETTAGE/HYSTEROSCOPY WITH MYOSURE;  Surgeon: [name], MD;  Location: [facility];  Service: Gynecology;  Laterality: N/A;  endometrial polyp  . DILATION AND CURETTAGE OF UTERUS  for miscarriage this year  . TONSILLECTOMY    . TONSILLECTOMY     and adenoid     Current Outpatient Medications  Medication Sig Dispense Refill  . albuterol (PROVENTIL HFA;VENTOLIN HFA) 108 (90 BASE) MCG/ACT inhaler Inhale 2 puffs into the lungs every 6 (six) hours as needed.    Marland Kitchen ibuprofen (ADVIL,MOTRIN) 800 MG tablet Take 1 tablet (800 mg total) by mouth every 8 (eight) hours as needed. 30 tablet 0  . levonorgestrel (MIRENA) 20 MCG/24HR IUD 1 Intra Uterine Device (1 each total) by Intrauterine route once for 1 dose. To treat endometrial cancer, fertility sparing 1 each 0  . metFORMIN (GLUCOPHAGE) 1000 MG tablet Take 1,000 mg by mouth 2 (two) times daily.     No current facility-administered medications for this visit.     ALLERGIES: Betadine [povidone iodine], Amoxicillin, Amoxicillin, and Penicillins  Family History  Problem Relation Age of Onset  . Depression Brother   .  Drug abuse Brother   . Heart disease Maternal Grandmother   . Hypertension Maternal Grandmother   . Hypertension Paternal Grandmother   . Depression Paternal Grandmother     Social History   Socioeconomic History  . Marital status: Married    Spouse name: Not on file  . Number of children: Not on file  . Years of education: Not on file  . Highest education level: Not on file  Occupational History  . Not on file  Tobacco Use  . Smoking status: Never Smoker  . Smokeless tobacco: Never Used  Substance and Sexual Activity  . Alcohol use: No  . Drug use: No  . Sexual activity: Yes    Birth control/protection: I.U.D.    Comment: had miscarriage may 2013  Other Topics Concern  . Not on file  Social History Narrative   **  Merged History Encounter **       Social Determinants of Health   Financial Resource Strain:   . Difficulty of Paying Living Expenses:   Food Insecurity:   . Worried About Charity fundraiser in the Last Year:   . Arboriculturist in the Last Year:   Transportation Needs:   . Film/video editor (Medical):   Marland Kitchen Lack of Transportation (Non-Medical):   Physical Activity:   . Days of Exercise per Week:   . Minutes of Exercise per Session:   Stress:   . Feeling of Stress :   Social Connections:   . Frequency of Communication with Friends and Family:   . Frequency of Social Gatherings with Friends and Family:   . Attends Religious Services:   . Active Member of Clubs or Organizations:   . Attends Archivist Meetings:   Marland Kitchen Marital Status:   Intimate Partner Violence:   . Fear of Current or Ex-Partner:   . Emotionally Abused:   Marland Kitchen Physically Abused:   . Sexually Abused:     Review of Systems  Constitutional: Negative.   HENT: Negative.   Eyes: Negative.   Respiratory: Negative.   Cardiovascular: Negative.   Gastrointestinal: Negative.   Genitourinary:       Vaginal irritation Vaginal discharge  Musculoskeletal: Negative.   Skin: Negative.   Neurological: Negative.   Endo/Heme/Allergies: Negative.   Psychiatric/Behavioral: Negative.     PHYSICAL EXAMINATION:    BP 116/70 (BP Location: Right Arm, Patient Position: Sitting, Cuff Size: Normal)   Pulse 78   Temp 98.2 F (36.8 C) (Temporal)   Ht 5' 2.75" (1.594 m)   Wt 281 lb 3.2 oz (127.6 kg)   SpO2 98%   BMI 50.21 kg/m     General appearance: alert, cooperative and appears stated age  Pelvic: External genitalia:  no lesions, few resolving boils on her labia majora, mild erythema on the inner labia majora.               Urethra:  normal appearing urethra with no masses, tenderness or lesions              Bartholins and Skenes: normal                 Vagina: very erythematous appearing vagina with an  increase in watery, white vaginal d/c               Chaperone was present for exam.  Wet prep: ? clue, no trich, + wbc, few para basilar cells KOH: no yeast PH: 5   ASSESSMENT Vulvovaginitis, ?BV  vs artifact. Self treated for yeast Vagina is atrophic appearing, but she has had been told she has good ovarian reserve by Dr Myrna Blazer for vaginitis testing Steroid ointment Discussed vulvar skin care Further treatment depending on result.    An After Visit Summary was printed and given to the patient.

## 2019-10-19 ENCOUNTER — Ambulatory Visit (INDEPENDENT_AMBULATORY_CARE_PROVIDER_SITE_OTHER): Payer: BC Managed Care – PPO | Admitting: Obstetrics and Gynecology

## 2019-10-19 ENCOUNTER — Other Ambulatory Visit: Payer: Self-pay

## 2019-10-19 ENCOUNTER — Encounter: Payer: Self-pay | Admitting: Obstetrics and Gynecology

## 2019-10-19 VITALS — BP 116/70 | HR 78 | Temp 98.2°F | Ht 62.75 in | Wt 281.2 lb

## 2019-10-19 DIAGNOSIS — N760 Acute vaginitis: Secondary | ICD-10-CM

## 2019-10-19 MED ORDER — BETAMETHASONE VALERATE 0.1 % EX OINT: 1.0000 "application " | TOPICAL_OINTMENT | Freq: Two times a day (BID) | CUTANEOUS | 0 refills | Status: DC

## 2019-10-19 NOTE — Patient Instructions (Signed)
Vaginitis Vaginitis is a condition in which the vaginal tissue swells and becomes red (inflamed). This condition is most often caused by a change in the normal balance of bacteria and yeast that live in the vagina. This change causes an overgrowth of certain bacteria or yeast, which causes the inflammation. There are different types of vaginitis, but the most common types are:  Bacterial vaginosis.  Yeast infection (candidiasis).  Trichomoniasis vaginitis. This is a sexually transmitted disease (STD).  Viral vaginitis.  Atrophic vaginitis.  Allergic vaginitis. What are the causes? The cause of this condition depends on the type of vaginitis. It can be caused by:  Bacteria (bacterial vaginosis).  Yeast, which is a fungus (yeast infection).  A parasite (trichomoniasis vaginitis).  A virus (viral vaginitis).  Low hormone levels (atrophic vaginitis). Low hormone levels can occur during pregnancy, breastfeeding, or after menopause.  Irritants, such as bubble baths, scented tampons, and feminine sprays (allergic vaginitis). Other factors can change the normal balance of the yeast and bacteria that live in the vagina. These include:  Antibiotic medicines.  Poor hygiene.  Diaphragms, vaginal sponges, spermicides, birth control pills, and intrauterine devices (IUD).  Sex.  Infection.  Uncontrolled diabetes.  A weakened defense (immune) system. What increases the risk? This condition is more likely to develop in women who:  Smoke.  Use vaginal douches, scented tampons, or scented sanitary pads.  Wear tight-fitting pants.  Wear thong underwear.  Use oral birth control pills or an IUD.  Have sex without a condom.  Have multiple sex partners.  Have an STD.  Frequently use the spermicide nonoxynol-9.  Eat lots of foods high in sugar.  Have uncontrolled diabetes.  Have low estrogen levels.  Have a weakened immune system from an immune disorder or medical  treatment.  Are pregnant or breastfeeding. What are the signs or symptoms? Symptoms vary depending on the cause of the vaginitis. Common symptoms include:  Abnormal vaginal discharge. ? The discharge is white, gray, or yellow with bacterial vaginosis. ? The discharge is thick, white, and cheesy with a yeast infection. ? The discharge is frothy and yellow or greenish with trichomoniasis.  A bad vaginal smell. The smell is fishy with bacterial vaginosis.  Vaginal itching, pain, or swelling.  Sex that is painful.  Pain or burning when urinating. Sometimes there are no symptoms. How is this diagnosed? This condition is diagnosed based on your symptoms and medical history. A physical exam, including a pelvic exam, will also be done. You may also have other tests, including:  Tests to determine the pH level (acidity or alkalinity) of your vagina.  A whiff test, to assess the odor that results when a sample of your vaginal discharge is mixed with a potassium hydroxide solution.  Tests of vaginal fluid. A sample will be examined under a microscope. How is this treated? Treatment varies depending on the type of vaginitis you have. Your treatment may include:  Antibiotic creams or pills to treat bacterial vaginosis and trichomoniasis.  Antifungal medicines, such as vaginal creams or suppositories, to treat a yeast infection.  Medicine to ease discomfort if you have viral vaginitis. Your sexual partner should also be treated.  Estrogen delivered in a cream, pill, suppository, or vaginal ring to treat atrophic vaginitis. If vaginal dryness occurs, lubricants and moisturizing creams may help. You may need to avoid scented soaps, sprays, or douches.  Stopping use of a product that is causing allergic vaginitis. Then using a vaginal cream to treat the symptoms. Follow   these instructions at home: Lifestyle  Keep your genital area clean and dry. Avoid soap, and only rinse the area with  water.  Do not douche or use tampons until your health care provider says it is okay to do so. Use sanitary pads, if needed.  Do not have sex until your health care provider approves. When you can return to sex, practice safe sex and use condoms.  Wipe from front to back. This avoids the spread of bacteria from the rectum to the vagina. General instructions  Take over-the-counter and prescription medicines only as told by your health care provider.  If you were prescribed an antibiotic medicine, take or use it as told by your health care provider. Do not stop taking or using the antibiotic even if you start to feel better.  Keep all follow-up visits as told by your health care provider. This is important. How is this prevented?  Use mild, non-scented products. Do not use things that can irritate the vagina, such as fabric softeners. Avoid the following products if they are scented: ? Feminine sprays. ? Detergents. ? Tampons. ? Feminine hygiene products. ? Soaps or bubble baths.  Let air reach your genital area. ? Wear cotton underwear to reduce moisture buildup. ? Avoid wearing underwear while you sleep. ? Avoid wearing tight pants and underwear or nylons without a cotton panel. ? Avoid wearing thong underwear.  Take off any wet clothing, such as bathing suits, as soon as possible.  Practice safe sex and use condoms. Contact a health care provider if:  You have abdominal pain.  You have a fever.  You have symptoms that last for more than 2-3 days. Get help right away if:  You have a fever and your symptoms suddenly get worse. Summary  Vaginitis is a condition in which the vaginal tissue becomes inflamed.This condition is most often caused by a change in the normal balance of bacteria and yeast that live in the vagina.  Treatment varies depending on the type of vaginitis you have.  Do not douche, use tampons , or have sex until your health care provider approves. When  you can return to sex, practice safe sex and use condoms. This information is not intended to replace advice given to you by your health care provider. Make sure you discuss any questions you have with your health care provider. Document Revised: 05/07/2017 Document Reviewed: 06/30/2016 Elsevier Patient Education  2020 Elsevier Inc.  

## 2019-10-22 LAB — NUSWAB VAGINITIS (VG)
Candida albicans, NAA: NEGATIVE
Candida glabrata, NAA: NEGATIVE
Trich vag by NAA: NEGATIVE

## 2019-11-24 ENCOUNTER — Telehealth: Payer: Self-pay

## 2019-11-24 ENCOUNTER — Ambulatory Visit (INDEPENDENT_AMBULATORY_CARE_PROVIDER_SITE_OTHER): Payer: BC Managed Care – PPO | Admitting: Obstetrics & Gynecology

## 2019-11-24 ENCOUNTER — Other Ambulatory Visit: Payer: Self-pay

## 2019-11-24 ENCOUNTER — Encounter: Payer: Self-pay | Admitting: Obstetrics & Gynecology

## 2019-11-24 VITALS — BP 122/78 | HR 68 | Temp 97.3°F | Ht 63.0 in | Wt 286.0 lb

## 2019-11-24 DIAGNOSIS — N764 Abscess of vulva: Secondary | ICD-10-CM | POA: Diagnosis not present

## 2019-11-24 DIAGNOSIS — R300 Dysuria: Secondary | ICD-10-CM

## 2019-11-24 LAB — POCT URINALYSIS DIPSTICK
Bilirubin, UA: NEGATIVE
Blood, UA: NEGATIVE
Glucose, UA: POSITIVE — AB
Ketones, UA: NEGATIVE
Leukocytes, UA: NEGATIVE
Nitrite, UA: NEGATIVE
Protein, UA: NEGATIVE
Urobilinogen, UA: 0.2 E.U./dL
pH, UA: 5 (ref 5.0–8.0)

## 2019-11-24 MED ORDER — SULFAMETHOXAZOLE-TRIMETHOPRIM 800-160 MG PO TABS: 1.0000 | ORAL_TABLET | Freq: Two times a day (BID) | ORAL | 0 refills | Status: DC

## 2019-11-24 MED ORDER — FLUCONAZOLE 150 MG PO TABS: ORAL_TABLET | ORAL | 0 refills | Status: DC

## 2019-11-24 MED ORDER — CLOBETASOL PROPIONATE 0.05 % EX OINT: 1.0000 "application " | TOPICAL_OINTMENT | Freq: Two times a day (BID) | CUTANEOUS | 0 refills | Status: DC

## 2019-11-24 NOTE — Progress Notes (Signed)
GYNECOLOGY  VISIT  CC:   Patient complains of having itching and burning with urination. Small amount of odor.  HPI: 24 y.o. G43P0000 Married Hispanic female here for complaint of vaginal/vulvar itching that has been present for about 10 days.  She was seen a month ago by Dr. Talbert Nan after self treating with monistat.  Vaginitis testing was negative.  Was treated with steroid for irritation.  Patient reports she is not completely sure that she is describing her anatomy very well or the location of the itching.  She does not think it is vaginal.  She feels that is external.  She does have some burning with urination.  This does not actually feel like dysuria but feels more like skin burning.  She does have itching as well.  Patient has history of endometrial cancer which is being treated conservatively with Mirena IUD.  She is being followed by Dr. Denman George.  Most recent endometrial biopsy was 03/03/2019 with benign polypoid endometrium with hormone effect.  GYNECOLOGIC HISTORY: No LMP recorded. (Menstrual status: IUD). Contraception: Mirena IUD Menopausal hormone therapy: n/a  Patient Active Problem List   Diagnosis Date Noted  . Viral URI 12/23/2018  . Exposure to COVID-19 virus 11/18/2018  . Suspected COVID-19 virus infection 11/18/2018  . Hypothyroidism 03/02/2018  . Anovulation 03/02/2018    Past Medical History:  Diagnosis Date  . Amenorrhea   . Asthma   . Asthma    when younger, not current problem  . Complication of anesthesia    slow to awaken after tonsils and adenoid surgery yrs ago  . Dysmenorrhea   . Hormone disorder   . Vision abnormalities    wears glasses    Past Surgical History:  Procedure Laterality Date  . ADENOIDECTOMY    . APPENDECTOMY    . CARPAL TUNNEL RELEASE  12/14/2011   Procedure: CARPAL TUNNEL RELEASE;  Surgeon: Tennis Must, MD;  Location: [place];  Service: Orthopedics;  Laterality: Left;  . CARPAL TUNNEL RELEASE  01/28/2012    Procedure: CARPAL TUNNEL RELEASE;  Surgeon: Tennis Must, MD;  Location: [place];  Service: Orthopedics;  Laterality: Right;  right carpal tunnel release  . CARPAL TUNNEL RELEASE Bilateral   . CATARACT EXTRACTION, BILATERAL    . DILATATION & CURETTAGE/HYSTEROSCOPY WITH MYOSURE N/A 08/15/2018   Procedure: DILATATION & CURETTAGE/HYSTEROSCOPY WITH MYOSURE;  Surgeon: [name], MD;  [facility];  Service: Gynecology;  Laterality: N/A;  endometrial polyp  . DILATION AND CURETTAGE OF UTERUS  for miscarriage this year  . TONSILLECTOMY    . TONSILLECTOMY     and adenoid     MEDS:   Current Outpatient Medications on File Prior to Visit  Medication Sig Dispense Refill  . albuterol (PROVENTIL HFA;VENTOLIN HFA) 108 (90 BASE) MCG/ACT inhaler Inhale 2 puffs into the lungs every 6 (six) hours as needed.    Marland Kitchen ibuprofen (ADVIL,MOTRIN) 800 MG tablet Take 1 tablet (800 mg total) by mouth every 8 (eight) hours as needed. 30 tablet 0  . levonorgestrel (MIRENA) 20 MCG/24HR IUD 1 Intra Uterine Device (1 each total) by Intrauterine route once for 1 dose. To treat endometrial cancer, fertility sparing 1 each 0  . metFORMIN (GLUCOPHAGE) 1000 MG tablet Take 1,000 mg by mouth 2 (two) times daily. (Patient not taking: Reported on 11/24/2019)     No current facility-administered medications on file prior to visit.    ALLERGIES: Betadine [povidone iodine], Amoxicillin, Amoxicillin, and Penicillins  Family History  Problem Relation Age of Onset  . Depression Brother   . Drug abuse Brother   . Heart disease Maternal Grandmother   . Hypertension Maternal Grandmother   . Hypertension Paternal Grandmother   . Depression Paternal Grandmother     SH: Married, non-smoker  Review of Systems  Genitourinary: Positive for vaginal discharge.       Vaginal itching Vaginal burning  All other systems reviewed and are negative.   PHYSICAL EXAMINATION:    BP 122/78 (BP  Location: Right Arm, Patient Position: Sitting, Cuff Size: Normal)   Pulse 68   Temp (!) 97.3 F (36.3 C) (Temporal)   Ht 5\' 3"  (1.6 m)   Wt 286 lb (129.7 kg)   BMI 50.66 kg/m     General appearance: alert, cooperative and appears stated age Abdomen: soft, non-tender; bowel sounds normal; no masses,  no organomegaly Lymph:  no inguinal LAD noted  Pelvic: External genitalia: Symmetric hypopigmentation of superior labia majora bilaterally above the clitoris (picture obtained for future reference).  Inferior to to this on the left labia majora is an erythematous draining nodular abscess that is tender to palpation              Urethra:  normal appearing urethra with no masses, tenderness or lesions              Bartholins and Skenes: normal                 Vagina: normal appearing vagina with normal color and discharge, no lesions              Cervix: no lesions and IUD string noted              Bimanual Exam:  Uterus:  normal size, contour, position, consistency, mobility, non-tender              Adnexa: no mass, fullness, tenderness                 Chaperone, Royal Hawthorn, CMA, was present for exam.  Assessment: External hypopigmentation, symmetric pattern, of superior labia majora with itching.  Findings c/w lichen simplex chronicus vs lichen sclerosus Vulvar abscess, draining H/o endometrial cancer with Mirena IUD use, followed by Dr. Denman George.  Plan: Clobetasol 0.05% ointment BID to affected area will be started.  Pt shown skin changes in mirror so knows where and how to apply ointment Recheck with Dr. Talbert Nan two weeks for possible vulvar biopsy Wound culture obtained and pt started on bactrim DS BID x 7 days Diflucan 150mg  po x 1, repeat in 72 hours if needed given to pt in case has yeast vaginitis from antibiotics   About 40 minutes total spent with pt, including exam, discussing possible causes for skin change, possible need to vulvar biopsy, additional strategies for recurrent  vulvar abscesses

## 2019-11-24 NOTE — Telephone Encounter (Signed)
Spoke with patient. Patient reports vaginal burning when urine touches the skin, external itching and white vaginal d/c with odor. Patient was seen in the office on 5/13, NuSwab negative. Patient reports symptoms have gotten worse over the past week. Has tried OTC monistat externally, no relief. Patient is requesting an OV.   OV scheduled for today at 4pm with covering provider, Dr. Sabra Heck. Covid 19 prescreen negative, precautions reviewed.   Routing to covering provider for final review. Patient is agreeable to disposition. Will close encounter.  Cc: Dr. Talbert Nan

## 2019-11-24 NOTE — Patient Instructions (Addendum)
Lichen Sclerosus Lichen sclerosus is a skin problem. It can happen on any part of the body, but it commonly involves the anal or genital areas. It can cause itching and discomfort in these areas. Treatment can help to control symptoms. When the genital area is affected, getting treatment is important because the condition can cause scarring that may lead to other problems. What are the causes? The cause of this condition is not known. It may be related to an overactive immune system or a lack of certain hormones. Lichen sclerosus is not an infection or a fungus, and it is not passed from one person to another (not contagious). What increases the risk? This condition is more likely to develop in women, usually after menopause. What are the signs or symptoms? Symptoms of this condition include:  Thin, wrinkled, white areas on the skin.  Thickened white areas on the skin.  Red and swollen patches (lesions) on the skin.  Tears or cracks in the skin.  Bruising.  Blood blisters.  Severe itching.  Pain, itching, or burning when urinating. Constipation is also common in people with lichen sclerosus. How is this diagnosed? This condition may be diagnosed with a physical exam. In some cases, a tissue sample (biopsy sample) may be removed to be looked at under a microscope. How is this treated? This condition is usually treated with medicated creams or ointments (topical steroids) that are applied over the affected areas. In some cases, treatment may also include medicines that are taken by mouth. Surgery may be needed in more severe cases that are causing problems such as scarring. Follow these instructions at home:  Take or use over-the-counter and prescription medicines only as told by your health care provider.  Use creams or ointments as told by your health care provider.  Do not scratch the affected areas of skin.  If you are a woman, be sure to keep the vaginal area as clean and dry  as possible.  Clean the affected area of skin gently with water. Avoid using rough towels or toilet paper.  Keep all follow-up visits as told by your health care provider. This is important. Contact a health care provider if:  You have increasing redness, swelling, or pain in the affected area.  You have fluid, blood, or pus coming from the affected area.  You have new lesions on your skin.  You have a fever.  You have pain during sex. Summary  Lichen sclerosus is a skin problem. When the genital area is affected, getting treatment is important because the condition can cause scarring that may lead to other problems.  This condition is usually treated with medicated creams or ointments (topical steroids) that are applied over the affected areas.  Take or use over-the-counter and prescription medicines only as told by your health care provider.  Contact a health care provider if you have new lesions on your skin, have pain during sex, or have increasing redness, swelling, or pain in the affected area.  Keep all follow-up visits as told by your health care provider. This is important. This information is not intended to replace advice given to you by your health care provider. Make sure you discuss any questions you have with your health care provider. Document Revised: 10/07/2017 Document Reviewed: 10/07/2017 Elsevier Patient Education  2020 Elsevier Inc.  

## 2019-11-24 NOTE — Telephone Encounter (Signed)
Patient is calling in regards to having itching and burning when urinating.

## 2019-11-28 LAB — WOUND CULTURE: Organism ID, Bacteria: NONE SEEN

## 2019-12-06 ENCOUNTER — Other Ambulatory Visit: Payer: Self-pay

## 2019-12-06 ENCOUNTER — Encounter: Payer: Self-pay | Admitting: Obstetrics and Gynecology

## 2019-12-06 ENCOUNTER — Ambulatory Visit (INDEPENDENT_AMBULATORY_CARE_PROVIDER_SITE_OTHER): Payer: BC Managed Care – PPO | Admitting: Obstetrics and Gynecology

## 2019-12-06 ENCOUNTER — Telehealth: Payer: Self-pay

## 2019-12-06 ENCOUNTER — Telehealth: Payer: Self-pay | Admitting: *Deleted

## 2019-12-06 VITALS — BP 122/74 | HR 94 | Temp 98.2°F | Ht 63.0 in | Wt 285.0 lb

## 2019-12-06 DIAGNOSIS — L732 Hidradenitis suppurativa: Secondary | ICD-10-CM | POA: Diagnosis not present

## 2019-12-06 DIAGNOSIS — C55 Malignant neoplasm of uterus, part unspecified: Secondary | ICD-10-CM

## 2019-12-06 DIAGNOSIS — N763 Subacute and chronic vulvitis: Secondary | ICD-10-CM | POA: Diagnosis not present

## 2019-12-06 DIAGNOSIS — Z6841 Body Mass Index (BMI) 40.0 and over, adult: Secondary | ICD-10-CM

## 2019-12-06 MED ORDER — DOXYCYCLINE HYCLATE 100 MG PO CAPS: 100.0000 mg | ORAL_CAPSULE | Freq: Every day | ORAL | 0 refills | Status: DC

## 2019-12-06 NOTE — Telephone Encounter (Signed)
Reviewed w/ Dr. Talbert Nan.   Doxycycline 100 mg po daily for 90 days. #90/0RF  Call returned to [facility], spoke w/ pharmacist, Janett Billow.  Confirmed RX as seen above. Read back and confirmed.   Encounter closed.

## 2019-12-06 NOTE — Telephone Encounter (Signed)
Referral placed to Dr. Eldridge Abrahams [facility] [address] [place], [place] [address] [phone] Joylene Igo)   Routing to [place].

## 2019-12-06 NOTE — Telephone Encounter (Signed)
-----   Message from [name], MD sent at 12/06/2019 12:55 PM EDT ----- Please place a referral to see a Surgeon for gastric bypass surgery. Thanks, Sharee Pimple

## 2019-12-06 NOTE — Telephone Encounter (Signed)
Pharmacy called to verify directions for prescription Doxycycline.

## 2019-12-06 NOTE — Progress Notes (Signed)
GYNECOLOGY  VISIT   HPI: 24 y.o.   Married   Other or two or more races Unavailable Hispanic or Latino  female   [id number] with No LMP recorded. (Menstrual status: IUD).   here for follow up of vulvitis. She states that she still has some itching on the skin.    She was seen last month and then again earlier this month with vaginitis symptoms. She had a negative vaginitis panel in 5/21. She was started on a steroid ointment 2 weeks ago and her itching is almost gone. She only used the steroid ointment for the first week, then just used it on the weekend.  She has a h/o recurrent boils on her mons and vulva. Typically has 1-2 at a time. Does get scaring from it. Has not been on antibiotics for this.   She has a mirena IUD as treatment for endometrial cancer, her last biopsy with Dr Denman George in 9/20 was benign. She wants to have a baby, but has a long h/o anovulation with PCOS. The fertility specialist recommend weight loss and IUI or IVF. She can't afford IVF and hasn't had success with weight loss. She is asking about hysterectomy, she discussed with this with Dr Denman George who encouraged her to continue with the [place]. She is Catholic and doesn't want to be on contraception. The mirena doesn't feel like a good long term option for her.   GYNECOLOGIC HISTORY: No LMP recorded. (Menstrual status: IUD). Contraception:IUD Menopausal hormone therapy: NA        OB History    Gravida  0   Para  0   Term  0   Preterm  0   AB  0   Living  0     SAB  0   TAB  0   Ectopic  0   Multiple  0   Live Births  0              Patient Active Problem List   Diagnosis Date Noted  . Viral URI 12/23/2018  . Exposure to COVID-19 virus 11/18/2018  . Suspected COVID-19 virus infection 11/18/2018  . Hypothyroidism 03/02/2018  . Anovulation 03/02/2018    Past Medical History:  Diagnosis Date  . Amenorrhea   . Asthma   . Asthma    when younger, not current problem  . Complication of  anesthesia    slow to awaken after tonsils and adenoid surgery yrs ago  . Dysmenorrhea   . Hormone disorder   . Vision abnormalities    wears glasses    Past Surgical History:  Procedure Laterality Date  . ADENOIDECTOMY    . APPENDECTOMY    . CARPAL TUNNEL RELEASE  12/14/2011   Procedure: CARPAL TUNNEL RELEASE;  Surgeon: Tennis Must, MD;  Location: [facility];  Service: Orthopedics;  Laterality: Left;  . CARPAL TUNNEL RELEASE  01/28/2012   Procedure: CARPAL TUNNEL RELEASE;  Surgeon: Tennis Must, MD;  Location: [place];  Service: Orthopedics;  Laterality: Right;  right carpal tunnel release  . CARPAL TUNNEL RELEASE Bilateral   . CATARACT EXTRACTION, BILATERAL    . DILATATION & CURETTAGE/HYSTEROSCOPY WITH MYOSURE N/A 08/15/2018   Procedure: DILATATION & CURETTAGE/HYSTEROSCOPY WITH MYOSURE;  Surgeon: [name], MD;  Location: [facility];  Service: Gynecology;  Laterality: N/A;  endometrial polyp  . DILATION AND CURETTAGE OF UTERUS  for miscarriage this year  . TONSILLECTOMY    . TONSILLECTOMY  and adenoid     Current Outpatient Medications  Medication Sig Dispense Refill  . albuterol (PROVENTIL HFA;VENTOLIN HFA) 108 (90 BASE) MCG/ACT inhaler Inhale 2 puffs into the lungs every 6 (six) hours as needed.    . clobetasol ointment (TEMOVATE) 4.23 % Apply 1 application topically 2 (two) times daily. Apply as directed twice daily 60 g 0  . fluconazole (DIFLUCAN) 150 MG tablet Take one tab and repeat in 72 hours if needed 2 tablet 0  . ibuprofen (ADVIL,MOTRIN) 800 MG tablet Take 1 tablet (800 mg total) by mouth every 8 (eight) hours as needed. 30 tablet 0  . levonorgestrel (MIRENA) 20 MCG/24HR IUD 1 Intra Uterine Device (1 each total) by Intrauterine route once for 1 dose. To treat endometrial cancer, fertility sparing 1 each 0   No current facility-administered medications for this visit.     ALLERGIES: Betadine [povidone  iodine], Amoxicillin, Amoxicillin, and Penicillins  Family History  Problem Relation Age of Onset  . Depression Brother   . Drug abuse Brother   . Heart disease Maternal Grandmother   . Hypertension Maternal Grandmother   . Hypertension Paternal Grandmother   . Depression Paternal Grandmother     Social History   Socioeconomic History  . Marital status: Married    Spouse name: Not on file  . Number of children: Not on file  . Years of education: Not on file  . Highest education level: Not on file  Occupational History  . Not on file  Tobacco Use  . Smoking status: Never Smoker  . Smokeless tobacco: Never Used  Vaping Use  . Vaping Use: Never used  Substance and Sexual Activity  . Alcohol use: No  . Drug use: No  . Sexual activity: Yes    Birth control/protection: I.U.D.    Comment: had miscarriage may 2013  Other Topics Concern  . Not on file  Social History Narrative   ** Merged History Encounter **       Social Determinants of Health   Financial Resource Strain:   . Difficulty of Paying Living Expenses:   Food Insecurity:   . Worried About Charity fundraiser in the Last Year:   . Arboriculturist in the Last Year:   Transportation Needs:   . Film/video editor (Medical):   Marland Kitchen Lack of Transportation (Non-Medical):   Physical Activity:   . Days of Exercise per Week:   . Minutes of Exercise per Session:   Stress:   . Feeling of Stress :   Social Connections:   . Frequency of Communication with Friends and Family:   . Frequency of Social Gatherings with Friends and Family:   . Attends Religious Services:   . Active Member of Clubs or Organizations:   . Attends Archivist Meetings:   Marland Kitchen Marital Status:   Intimate Partner Violence:   . Fear of Current or Ex-Partner:   . Emotionally Abused:   Marland Kitchen Physically Abused:   . Sexually Abused:     Review of Systems  Genitourinary:       Vaginal itching  All other systems reviewed and are  negative.   PHYSICAL EXAMINATION:    BP 122/74   Pulse 94   Temp 98.2 F (36.8 C)   Ht 5\' 3"  (1.6 m)   Wt 285 lb (129.3 kg)   SpO2 99%   BMI 50.49 kg/m     General appearance: alert, cooperative and appears stated age   Pelvic: External  genitalia:  no lesions, minimal whitening just at the upper labia majora (improved). She does have a boil on the left vulva, 2 resolving boils on her buttocks and some scarring noted from prior biols.               Urethra:  normal appearing urethra with no masses, tenderness or lesions              ASSESSMENT Vulvitis, much improved (prior negative vaginitis testing) Hidradenitis suppurativa  H/o Endometrial Adenocarcinoma. Has the mirena, but doesn't want to stay on it.  H/O PCOS, anovulation Desires pregnancy, but can't afford IVF BMI 50    PLAN Discussed vulvar skin care Start on doxycyline 100 mg a day for 3 months. Discussed that with significant weight loss that she could potentially start to ovulate on her own Discussed options of referral to medical or surgical weight loss specialist. She would like referral to weight loss surgeon Return for annual exam, she needs blood work, including screening for diabetes and elevated lipids (at risk of metabolic syndrome)   Over 25 minutes was spent in total patient care.

## 2019-12-06 NOTE — Patient Instructions (Signed)
Hidradenitis Suppurativa Hidradenitis suppurativa is a long-term (chronic) skin disease. It is similar to a severe form of acne, but it affects areas of the body where acne would be unusual, especially areas of the body where skin rubs against skin and becomes moist. These include:  Underarms.  Groin.  Genital area.  Buttocks.  Upper thighs.  Breasts. Hidradenitis suppurativa may start out as small lumps or pimples caused by blocked sweat glands or hair follicles. Pimples may develop into deep sores that break open (rupture) and drain pus. Over time, affected areas of skin may thicken and become scarred. This condition is rare and does not spread from person to person (non-contagious). What are the causes? The exact cause of this condition is not known. It may be related to:  Female and female hormones.  An overactive disease-fighting system (immune system). The immune system may over-react to blocked hair follicles or sweat glands and cause swelling and pus-filled sores. What increases the risk? You are more likely to develop this condition if you:  Are female.  Are 11-55 years old.  Have a family history of hidradenitis suppurativa.  Have a personal history of acne.  Are overweight.  Smoke.  Take the medicine lithium. What are the signs or symptoms? The first symptoms are usually painful bumps in the skin, similar to pimples. The condition may get worse over time (progress), or it may only cause mild symptoms. If the disease progresses, symptoms may include:  Skin bumps getting bigger and growing deeper into the skin.  Bumps rupturing and draining pus.  Itchy, infected skin.  Skin getting thicker and scarred.  Tunnels under the skin (fistulas) where pus drains from a bump.  Pain during daily activities, such as pain during walking if your groin area is affected.  Emotional problems, such as stress or depression. This condition may affect your appearance and your  ability or willingness to wear certain clothes or do certain activities. How is this diagnosed? This condition is diagnosed by a health care provider who specializes in skin diseases (dermatologist). You may be diagnosed based on:  Your symptoms and medical history.  A physical exam.  Testing a pus sample for infection.  Blood tests. How is this treated? Your treatment will depend on how severe your symptoms are. The same treatment will not work for everybody with this condition. You may need to try several treatments to find what works best for you. Treatment may include:  Cleaning and bandaging (dressing) your wounds as needed.  Lifestyle changes, such as new skin care routines.  Taking medicines, such as: ? Antibiotics. ? Acne medicines. ? Medicines to reduce the activity of the immune system. ? A diabetes medicine (metformin). ? Birth control pills, for women. ? Steroids to reduce swelling and pain.  Working with a mental health care provider, if you experience emotional distress due to this condition. If you have severe symptoms that do not get better with medicine, you may need surgery. Surgery may involve:  Using a laser to clear the skin and remove hair follicles.  Opening and draining deep sores.  Removing the areas of skin that are diseased and scarred. Follow these instructions at home: Medicines   Take over-the-counter and prescription medicines only as told by your health care provider.  If you were prescribed an antibiotic medicine, take it as told by your health care provider. Do not stop taking the antibiotic even if your condition improves. Skin care  If you have open wounds, cover   them with a clean dressing as told by your health care provider. Keep wounds clean by washing them gently with soap and water when you bathe.  Do not shave the areas where you get hidradenitis suppurativa.  Do not wear deodorant.  Wear loose-fitting clothes.  Try to avoid  getting overheated or sweaty. If you get sweaty or wet, change into clean, dry clothes as soon as you can.  To help relieve pain and itchiness, cover sore areas with a warm, clean washcloth (warm compress) for 5-10 minutes as often as needed.  If told by your health care provider, take a bleach bath twice a week: ? Fill your bathtub halfway with water. ? Pour in  cup of unscented household bleach. ? Soak in the tub for 5-10 minutes. ? Only soak from the neck down. Avoid water on your face and hair. ? Shower to rinse off the bleach from your skin. General instructions  Learn as much as you can about your disease so that you have an active role in your treatment. Work closely with your health care provider to find treatments that work for you.  If you are overweight, work with your health care provider to lose weight as recommended.  Do not use any products that contain nicotine or tobacco, such as cigarettes and e-cigarettes. If you need help quitting, ask your health care provider.  If you struggle with living with this condition, talk with your health care provider or work with a mental health care provider as recommended.  Keep all follow-up visits as told by your health care provider. This is important. Where to find more information  Hidradenitis Suppurativa Foundation, Inc.: https://www.hs-foundation.org/ Contact a health care provider if you have:  A flare-up of hidradenitis suppurativa.  A fever or chills.  Trouble controlling your symptoms at home.  Trouble doing your daily activities because of your symptoms.  Trouble dealing with emotional problems related to your condition. Summary  Hidradenitis suppurativa is a long-term (chronic) skin disease. It is similar to a severe form of acne, but it affects areas of the body where acne would be unusual.  The first symptoms are usually painful bumps in the skin, similar to pimples. The condition may get worse over time  (progress), or it may only cause mild symptoms.  If you have open wounds, cover them with a clean dressing as told by your health care provider. Keep wounds clean by washing them gently with soap and water when you bathe.  Besides skin care, treatment may include medicines, laser treatment, and surgery. This information is not intended to replace advice given to you by your health care provider. Make sure you discuss any questions you have with your health care provider. Document Revised: 06/02/2017 Document Reviewed: 06/02/2017 Elsevier Patient Education  2020 Elsevier Inc.  

## 2020-01-03 NOTE — Telephone Encounter (Signed)
Rosa -did this patient get scheduled?

## 2020-01-09 ENCOUNTER — Telehealth: Payer: Self-pay | Admitting: Obstetrics and Gynecology

## 2020-01-09 NOTE — Telephone Encounter (Addendum)
Spoke with patient. Patient declines referral to Dr. Toney Rakes at this time. She has started a new job, will not have insurance for 90 days. Patient will further discuss at 04/26/20 AEX with Dr. Talbert Nan. Will close referral for now, patient is aware to contact the office if she desire referral.   Routing to provider for final review. Patient is agreeable to disposition. Will close encounter.  CC: Melissa Riggs

## 2020-01-09 NOTE — Telephone Encounter (Signed)
  Rosana Hoes, Basilia Jumbo L routed conversation to [facility]; [facility] Triage Pool 6 minutes ago (9:42 AM)  Magdalene Patricia L 6 minutes ago (9:42 AM)  RD   Per Almyra Free at Dr. Sandrea Hughs office  the patient has not responded to multiple calls for scheduling

## 2020-01-09 NOTE — Telephone Encounter (Signed)
See open telephone encounter dated 12/06/19.   Encounter closed.

## 2020-01-09 NOTE — Telephone Encounter (Signed)
Per Almyra Free at Dr. Sandrea Hughs office  the patient has not responded to multiple calls for scheduling.

## 2020-01-12 ENCOUNTER — Telehealth: Payer: Self-pay | Admitting: Obstetrics and Gynecology

## 2020-01-12 NOTE — Telephone Encounter (Signed)
Patient has a yeast infection. °

## 2020-01-12 NOTE — Telephone Encounter (Signed)
Call reviewed with Dr. Sabra Heck, OV needed.   Call returned to patient, advised as seen above. Patient has additional questions for front office prior to scheduling. Call transferred to [facility] for assistance.   Routing to [employer].

## 2020-01-12 NOTE — Telephone Encounter (Signed)
Spoke with patient. Patient reports thick, white vaginal d/c and itching. Started 2 wks ago. Labia is irritated, skin burns when urine touches the skin. Has tried OTC monistat with no change in symptoms, has also tried keeping clothes and skin clean and dry. Denies urinary symptoms, vaginal odor, irregular bleeding.    Patient declines OV at this time, currently does not have insurance. Patient is requesting a RX for yeast.   Advised patient our office does not typically treat over the phone, will have to review request with covering provider and f/u. Patient agreeable.   Last AEX 03/24/18 w/ Dr. Talbert Nan Next 04/26/20 Last OV 12/06/19  Dr. Sabra Heck  -please review and advise.

## 2020-01-15 NOTE — Telephone Encounter (Signed)
Reviewed with Alora. Patient declined OV at this time.   Encounter closed.

## 2020-01-31 ENCOUNTER — Ambulatory Visit: Payer: BC Managed Care – PPO | Admitting: Obstetrics and Gynecology

## 2020-01-31 ENCOUNTER — Encounter: Payer: Self-pay | Admitting: Obstetrics and Gynecology

## 2020-01-31 ENCOUNTER — Telehealth: Payer: Self-pay | Admitting: Obstetrics and Gynecology

## 2020-01-31 ENCOUNTER — Other Ambulatory Visit: Payer: Self-pay

## 2020-01-31 ENCOUNTER — Ambulatory Visit (INDEPENDENT_AMBULATORY_CARE_PROVIDER_SITE_OTHER): Payer: Self-pay | Admitting: Obstetrics and Gynecology

## 2020-01-31 VITALS — BP 128/72 | HR 92 | Ht 62.0 in | Wt 291.8 lb

## 2020-01-31 DIAGNOSIS — B373 Candidiasis of vulva and vagina: Secondary | ICD-10-CM

## 2020-01-31 DIAGNOSIS — B3731 Acute candidiasis of vulva and vagina: Secondary | ICD-10-CM

## 2020-01-31 MED ORDER — FLUCONAZOLE 150 MG PO TABS: 150.0000 mg | ORAL_TABLET | Freq: Once | ORAL | 0 refills | Status: AC

## 2020-01-31 NOTE — Patient Instructions (Signed)
Vaginal Yeast Infection, Adult  Vaginal yeast infection is a condition that causes vaginal discharge as well as soreness, swelling, and redness (inflammation) of the vagina. This is a common condition. Some women get this infection frequently. What are the causes? This condition is caused by a change in the normal balance of the yeast (candida) and bacteria that live in the vagina. This change causes an overgrowth of yeast, which causes the inflammation. What increases the risk? The condition is more likely to develop in women who:  Take antibiotic medicines.  Have diabetes.  Take birth control pills.  Are pregnant.  Douche often.  Have a weak body defense system (immune system).  Have been taking steroid medicines for a long time.  Frequently wear tight clothing. What are the signs or symptoms? Symptoms of this condition include:  White, thick, creamy vaginal discharge.  Swelling, itching, redness, and irritation of the vagina. The lips of the vagina (vulva) may be affected as well.  Pain or a burning feeling while urinating.  Pain during sex. How is this diagnosed? This condition is diagnosed based on:  Your medical history.  A physical exam.  A pelvic exam. Your health care provider will examine a sample of your vaginal discharge under a microscope. Your health care provider may send this sample for testing to confirm the diagnosis. How is this treated? This condition is treated with medicine. Medicines may be over-the-counter or prescription. You may be told to use one or more of the following:  Medicine that is taken by mouth (orally).  Medicine that is applied as a cream (topically).  Medicine that is inserted directly into the vagina (suppository). Follow these instructions at home:  Lifestyle  Do not have sex until your health care provider approves. Tell your sex partner that you have a yeast infection. That person should go to his or her health care  provider and ask if they should also be treated.  Do not wear tight clothes, such as pantyhose or tight pants.  Wear breathable cotton underwear. General instructions  Take or apply over-the-counter and prescription medicines only as told by your health care provider.  Eat more yogurt. This may help to keep your yeast infection from returning.  Do not use tampons until your health care provider approves.  Try taking a sitz bath to help with discomfort. This is a warm water bath that is taken while you are sitting down. The water should only come up to your hips and should cover your buttocks. Do this 3-4 times per day or as told by your health care provider.  Do not douche.  If you have diabetes, keep your blood sugar levels under control.  Keep all follow-up visits as told by your health care provider. This is important. Contact a health care provider if:  You have a fever.  Your symptoms go away and then return.  Your symptoms do not get better with treatment.  Your symptoms get worse.  You have new symptoms.  You develop blisters in or around your vagina.  You have blood coming from your vagina and it is not your menstrual period.  You develop pain in your abdomen. Summary  Vaginal yeast infection is a condition that causes discharge as well as soreness, swelling, and redness (inflammation) of the vagina.  This condition is treated with medicine. Medicines may be over-the-counter or prescription.  Take or apply over-the-counter and prescription medicines only as told by your health care provider.  Do not douche.   Do not have sex or use tampons until your health care provider approves.  Contact a health care provider if your symptoms do not get better with treatment or your symptoms go away and then return. This information is not intended to replace advice given to you by your health care provider. Make sure you discuss any questions you have with your health care  provider. Document Revised: 12/23/2018 Document Reviewed: 10/11/2017 Elsevier Patient Education  2020 Elsevier Inc.  

## 2020-01-31 NOTE — Telephone Encounter (Signed)
Patient has a yeast infection. Sending to triage to assist with scheduling.

## 2020-01-31 NOTE — Telephone Encounter (Signed)
Spoke with patient. Reports white vaginal d/c, no odor and vaginal itching. Has been itching to the point where skin is raw and bleeding. Burning when urine touches skin. Has tried Ph wipes, OTC AZO for yeast and took 1 diflucan 2 wks ago, no change in symptoms. Symptoms have been present for 1 month. She has been seen for similar symptoms in the past, last OV 12/06/19. OV scheduled for today at 9:15am with Dr. Talbert Nan. Patient is agreeable to date and time.   Encounter closed.

## 2020-01-31 NOTE — Progress Notes (Signed)
GYNECOLOGY  VISIT   HPI: 24 y.o.   Married   Other or two or more races Unavailable Hispanic or Latino  female   G0P0000 with Patient's last menstrual period was 12/21/2019.   here for possible yeast infection. She has been very itchy. She has think discharge.   Her current itching started about a month ago. She has used the steroid ointment a couple of times. The itching has gotten progressively worse to the point that she bleeds sometimes. She has a thick, clumpy vaginal d/c.   She was started on Doxycycline in 7/21 for hydradenitis suppurativa. Much improved, her boils resolved. She went on vacation, forgot her medication and now has 2 small boils.   GYNECOLOGIC HISTORY: Patient's last menstrual period was 12/21/2019. Contraception:IUD Menopausal hormone therapy: none        OB History    Gravida  0   Para  0   Term  0   Preterm  0   AB  0   Living  0     SAB  0   TAB  0   Ectopic  0   Multiple  0   Live Births  0              Patient Active Problem List   Diagnosis Date Noted  . Viral URI 12/23/2018  . Exposure to COVID-19 virus 11/18/2018  . Suspected COVID-19 virus infection 11/18/2018  . Hypothyroidism 03/02/2018  . Anovulation 03/02/2018    Past Medical History:  Diagnosis Date  . Amenorrhea   . Asthma   . Asthma    when younger, not current problem  . Complication of anesthesia    slow to awaken after tonsils and adenoid surgery yrs ago  . Dysmenorrhea   . Hormone disorder   . Vision abnormalities    wears glasses    Past Surgical History:  Procedure Laterality Date  . ADENOIDECTOMY    . APPENDECTOMY    . CARPAL TUNNEL RELEASE  12/14/2011   Procedure: CARPAL TUNNEL RELEASE;  Surgeon: Tennis Must, MD;  Location: [place];  Service: Orthopedics;  Laterality: Left;  . CARPAL TUNNEL RELEASE  01/28/2012   Procedure: CARPAL TUNNEL RELEASE;  Surgeon: Tennis Must, MD;  Location: [place];  Service:  Orthopedics;  Laterality: Right;  right carpal tunnel release  . CARPAL TUNNEL RELEASE Bilateral   . CATARACT EXTRACTION, BILATERAL    . DILATATION & CURETTAGE/HYSTEROSCOPY WITH MYOSURE N/A 08/15/2018   Procedure: DILATATION & CURETTAGE/HYSTEROSCOPY WITH MYOSURE;  Surgeon: [name], MD;  Location: [facility];  Service: Gynecology;  Laterality: N/A;  endometrial polyp  . DILATION AND CURETTAGE OF UTERUS  for miscarriage this year  . TONSILLECTOMY    . TONSILLECTOMY     and adenoid     Current Outpatient Medications  Medication Sig Dispense Refill  . albuterol (PROVENTIL HFA;VENTOLIN HFA) 108 (90 BASE) MCG/ACT inhaler Inhale 2 puffs into the lungs every 6 (six) hours as needed.    . clobetasol ointment (TEMOVATE) 5.32 % Apply 1 application topically 2 (two) times daily. Apply as directed twice daily 60 g 0  . doxycycline (VIBRAMYCIN) 100 MG capsule Take 1 capsule (100 mg total) by mouth daily. Take BID for 14 days.  Take with food as can cause GI distress. 90 capsule 0  . ibuprofen (ADVIL,MOTRIN) 800 MG tablet Take 1 tablet (800 mg total) by mouth every 8 (eight) hours as needed. 30 tablet 0  .  levonorgestrel (MIRENA) 20 MCG/24HR IUD 1 Intra Uterine Device (1 each total) by Intrauterine route once for 1 dose. To treat endometrial cancer, fertility sparing 1 each 0   No current facility-administered medications for this visit.     ALLERGIES: Betadine [povidone iodine], Amoxicillin, Amoxicillin, and Penicillins  Family History  Problem Relation Age of Onset  . Depression Brother   . Drug abuse Brother   . Heart disease Maternal Grandmother   . Hypertension Maternal Grandmother   . Hypertension Paternal Grandmother   . Depression Paternal Grandmother     Social History   Socioeconomic History  . Marital status: Married    Spouse name: Not on file  . Number of children: Not on file  . Years of education: Not on file  . Highest education level: Not on file   Occupational History  . Not on file  Tobacco Use  . Smoking status: Never Smoker  . Smokeless tobacco: Never Used  Vaping Use  . Vaping Use: Never used  Substance and Sexual Activity  . Alcohol use: No  . Drug use: No  . Sexual activity: Yes    Birth control/protection: I.U.D.    Comment: had miscarriage may 2013  Other Topics Concern  . Not on file  Social History Narrative   ** Merged History Encounter **       Social Determinants of Health   Financial Resource Strain:   . Difficulty of Paying Living Expenses: Not on file  Food Insecurity:   . Worried About Charity fundraiser in the Last Year: Not on file  . Ran Out of Food in the Last Year: Not on file  Transportation Needs:   . Lack of Transportation (Medical): Not on file  . Lack of Transportation (Non-Medical): Not on file  Physical Activity:   . Days of Exercise per Week: Not on file  . Minutes of Exercise per Session: Not on file  Stress:   . Feeling of Stress : Not on file  Social Connections:   . Frequency of Communication with Friends and Family: Not on file  . Frequency of Social Gatherings with Friends and Family: Not on file  . Attends Religious Services: Not on file  . Active Member of Clubs or Organizations: Not on file  . Attends Archivist Meetings: Not on file  . Marital Status: Not on file  Intimate Partner Violence:   . Fear of Current or Ex-Partner: Not on file  . Emotionally Abused: Not on file  . Physically Abused: Not on file  . Sexually Abused: Not on file    Review of Systems  Genitourinary: Positive for dysuria.  All other systems reviewed and are negative.   PHYSICAL EXAMINATION:    BP 128/72   Pulse 92   Ht 5\' 2"  (1.575 m)   Wt 291 lb 12.8 oz (132.4 kg)   LMP 12/21/2019   SpO2 98%   BMI 53.37 kg/m     General appearance: alert, cooperative and appears stated age   Pelvic: External genitalia:  Scarring from prior boils. Diffuse erythema, minimal whitening at  the upper labia majora bilaterally.              Urethra:  normal appearing urethra with no masses, tenderness or lesions              Bartholins and Skenes: normal                 Vagina: erythematous appearing vagina with  a slight increase in watery and clumpy vaginal d/c              Cervix: no lesions and IUD string 3 cm             Chaperone was present for exam.  ASSESSMENT Yeast vaginitis    PLAN Treat with diflucan Steroid ointment BID for one week (she still has some)

## 2020-02-26 ENCOUNTER — Encounter: Payer: Self-pay | Admitting: Gynecologic Oncology

## 2020-02-27 ENCOUNTER — Ambulatory Visit (INDEPENDENT_AMBULATORY_CARE_PROVIDER_SITE_OTHER): Payer: Self-pay | Admitting: Obstetrics and Gynecology

## 2020-02-27 ENCOUNTER — Other Ambulatory Visit: Payer: Self-pay

## 2020-02-27 ENCOUNTER — Telehealth: Payer: Self-pay

## 2020-02-27 ENCOUNTER — Encounter: Payer: Self-pay | Admitting: Obstetrics and Gynecology

## 2020-02-27 VITALS — BP 130/82 | HR 104 | Temp 98.5°F | Ht 63.0 in | Wt 292.0 lb

## 2020-02-27 DIAGNOSIS — Z8542 Personal history of malignant neoplasm of other parts of uterus: Secondary | ICD-10-CM

## 2020-02-27 DIAGNOSIS — B373 Candidiasis of vulva and vagina: Secondary | ICD-10-CM

## 2020-02-27 DIAGNOSIS — N939 Abnormal uterine and vaginal bleeding, unspecified: Secondary | ICD-10-CM

## 2020-02-27 DIAGNOSIS — B3731 Acute candidiasis of vulva and vagina: Secondary | ICD-10-CM

## 2020-02-27 MED ORDER — FLUCONAZOLE 150 MG PO TABS: 150.0000 mg | ORAL_TABLET | Freq: Once | ORAL | 0 refills | Status: AC

## 2020-02-27 NOTE — Telephone Encounter (Signed)
Patient is calling in regards to a recurring infection. Patient would like to know if she needs to come in to be seen or can have a prescription called in.

## 2020-02-27 NOTE — Progress Notes (Signed)
GYNECOLOGY  VISIT   HPI: 24 y.o.   Married   Other or two or more races Unavailable Hispanic or Latino  female   G0P0000 with Patient's last menstrual period was 02/26/2020.   here for Vaginal itching. She aslo saw her oncologist and was told her IUD isn't working because she is having bleeding. She is having some cramping.   She had a mirena IUD inserted in 3/20 for endometrial adenocarcinoma. Last biopsy was in 9/20. No bleeding until July this year. She had a cycle for 4 days, light flow. Another cycle last week, light spotting.   She was treated for yeast last month. Got better for 2-3 weeks, until last week. Current symptoms are itching, clumpy brown vaginal d/c, irritated. No odor.   GYNECOLOGIC HISTORY: Patient's last menstrual period was 02/26/2020. Contraception:IUD Menopausal hormone therapy: none         OB History    Gravida  0   Para  0   Term  0   Preterm  0   AB  0   Living  0     SAB  0   TAB  0   Ectopic  0   Multiple  0   Live Births  0              Patient Active Problem List   Diagnosis Date Noted  . Viral URI 12/23/2018  . Exposure to COVID-19 virus 11/18/2018  . Suspected COVID-19 virus infection 11/18/2018  . Hypothyroidism 03/02/2018  . Anovulation 03/02/2018    Past Medical History:  Diagnosis Date  . Amenorrhea   . Asthma   . Asthma    when younger, not current problem  . Complication of anesthesia    slow to awaken after tonsils and adenoid surgery yrs ago  . Dysmenorrhea   . Hormone disorder   . Vision abnormalities    wears glasses    Past Surgical History:  Procedure Laterality Date  . ADENOIDECTOMY    . APPENDECTOMY    . CARPAL TUNNEL RELEASE  12/14/2011   Procedure: CARPAL TUNNEL RELEASE;  Surgeon: Tennis Must, MD;  Location: [place];  Service: Orthopedics;  Laterality: Left;  . CARPAL TUNNEL RELEASE  01/28/2012   Procedure: CARPAL TUNNEL RELEASE;  Surgeon: Tennis Must, MD;  Location: [place];  Service: Orthopedics;  Laterality: Right;  right carpal tunnel release  . CARPAL TUNNEL RELEASE Bilateral   . CATARACT EXTRACTION, BILATERAL    . DILATATION & CURETTAGE/HYSTEROSCOPY WITH MYOSURE N/A 08/15/2018   Procedure: DILATATION & CURETTAGE/HYSTEROSCOPY WITH MYOSURE;  Surgeon: [name], MD;  Location: [facility];  Service: Gynecology;  Laterality: N/A;  endometrial polyp  . DILATION AND CURETTAGE OF UTERUS  for miscarriage this year  . TONSILLECTOMY    . TONSILLECTOMY     and adenoid     Current Outpatient Medications  Medication Sig Dispense Refill  . albuterol (PROVENTIL HFA;VENTOLIN HFA) 108 (90 BASE) MCG/ACT inhaler Inhale 2 puffs into the lungs every 6 (six) hours as needed.    . clobetasol ointment (TEMOVATE) 9.79 % Apply 1 application topically 2 (two) times daily. Apply as directed twice daily 60 g 0  . doxycycline (VIBRAMYCIN) 100 MG capsule Take 1 capsule (100 mg total) by mouth daily. Take BID for 14 days.  Take with food as can cause GI distress. 90 capsule 0  . ibuprofen (ADVIL,MOTRIN) 800 MG tablet Take 1 tablet (800 mg total) by  mouth every 8 (eight) hours as needed. 30 tablet 0  . levonorgestrel (MIRENA) 20 MCG/24HR IUD 1 Intra Uterine Device (1 each total) by Intrauterine route once for 1 dose. To treat endometrial cancer, fertility sparing 1 each 0   No current facility-administered medications for this visit.     ALLERGIES: Betadine [povidone iodine], Amoxicillin, Amoxicillin, and Penicillins  Family History  Problem Relation Age of Onset  . Depression Brother   . Drug abuse Brother   . Heart disease Maternal Grandmother   . Hypertension Maternal Grandmother   . Hypertension Paternal Grandmother   . Depression Paternal Grandmother     Social History   Socioeconomic History  . Marital status: Married    Spouse name: Not on file  . Number of children: Not on file  . Years of education: Not on file  .  Highest education level: Not on file  Occupational History  . Not on file  Tobacco Use  . Smoking status: Never Smoker  . Smokeless tobacco: Never Used  Vaping Use  . Vaping Use: Never used  Substance and Sexual Activity  . Alcohol use: No  . Drug use: No  . Sexual activity: Yes    Birth control/protection: I.U.D.    Comment: had miscarriage may 2013  Other Topics Concern  . Not on file  Social History Narrative   ** Merged History Encounter **       Social Determinants of Health   Financial Resource Strain:   . Difficulty of Paying Living Expenses: Not on file  Food Insecurity:   . Worried About Charity fundraiser in the Last Year: Not on file  . Ran Out of Food in the Last Year: Not on file  Transportation Needs:   . Lack of Transportation (Medical): Not on file  . Lack of Transportation (Non-Medical): Not on file  Physical Activity:   . Days of Exercise per Week: Not on file  . Minutes of Exercise per Session: Not on file  Stress:   . Feeling of Stress : Not on file  Social Connections:   . Frequency of Communication with Friends and Family: Not on file  . Frequency of Social Gatherings with Friends and Family: Not on file  . Attends Religious Services: Not on file  . Active Member of Clubs or Organizations: Not on file  . Attends Archivist Meetings: Not on file  . Marital Status: Not on file  Intimate Partner Violence:   . Fear of Current or Ex-Partner: Not on file  . Emotionally Abused: Not on file  . Physically Abused: Not on file  . Sexually Abused: Not on file    ROS  PHYSICAL EXAMINATION:    BP 130/82   Pulse (!) 104   Temp 98.5 F (36.9 C)   Ht 5\' 3"  (1.6 m)   Wt 292 lb (132.5 kg)   LMP 02/26/2020   SpO2 97%   BMI 51.73 kg/m     General appearance: alert, cooperative and appears stated age  Pelvic: External genitalia:  Erythematous, some whitening on the upper inner labia majora              Urethra:  normal appearing urethra  with no masses, tenderness or lesions              Bartholins and Skenes: normal                 Vagina: erythematous appearing vagina with a slight amount of  clumpy vaginal d/c              Cervix: no lesions and IUD string 2-3 cm                Chaperone was present for exam.  Wet prep: ? Clue, no trich, + wbc KOH: ++ yeast PH: 5   ASSESSMENT Vulvovaginitis, yeast on slides H/O endometrial adenocarcinoma, mirena IUD placed in 3/20, benign biopsy in 9/20. She has recently had some bleeding.     PLAN Treat with diflucan Patient without insurance, will not send nuswab, if vaginitis symptoms persist she will return Return for endometrial biopsy after treated for yeast She will return for an annual exam with screening labs when she can

## 2020-02-27 NOTE — Patient Instructions (Signed)
Vaginal Yeast Infection, Adult  Vaginal yeast infection is a condition that causes vaginal discharge as well as soreness, swelling, and redness (inflammation) of the vagina. This is a common condition. Some women get this infection frequently. What are the causes? This condition is caused by a change in the normal balance of the yeast (candida) and bacteria that live in the vagina. This change causes an overgrowth of yeast, which causes the inflammation. What increases the risk? The condition is more likely to develop in women who:  Take antibiotic medicines.  Have diabetes.  Take birth control pills.  Are pregnant.  Douche often.  Have a weak body defense system (immune system).  Have been taking steroid medicines for a long time.  Frequently wear tight clothing. What are the signs or symptoms? Symptoms of this condition include:  White, thick, creamy vaginal discharge.  Swelling, itching, redness, and irritation of the vagina. The lips of the vagina (vulva) may be affected as well.  Pain or a burning feeling while urinating.  Pain during sex. How is this diagnosed? This condition is diagnosed based on:  Your medical history.  A physical exam.  A pelvic exam. Your health care provider will examine a sample of your vaginal discharge under a microscope. Your health care provider may send this sample for testing to confirm the diagnosis. How is this treated? This condition is treated with medicine. Medicines may be over-the-counter or prescription. You may be told to use one or more of the following:  Medicine that is taken by mouth (orally).  Medicine that is applied as a cream (topically).  Medicine that is inserted directly into the vagina (suppository). Follow these instructions at home:  Lifestyle  Do not have sex until your health care provider approves. Tell your sex partner that you have a yeast infection. That person should go to his or her health care  provider and ask if they should also be treated.  Do not wear tight clothes, such as pantyhose or tight pants.  Wear breathable cotton underwear. General instructions  Take or apply over-the-counter and prescription medicines only as told by your health care provider.  Eat more yogurt. This may help to keep your yeast infection from returning.  Do not use tampons until your health care provider approves.  Try taking a sitz bath to help with discomfort. This is a warm water bath that is taken while you are sitting down. The water should only come up to your hips and should cover your buttocks. Do this 3-4 times per day or as told by your health care provider.  Do not douche.  If you have diabetes, keep your blood sugar levels under control.  Keep all follow-up visits as told by your health care provider. This is important. Contact a health care provider if:  You have a fever.  Your symptoms go away and then return.  Your symptoms do not get better with treatment.  Your symptoms get worse.  You have new symptoms.  You develop blisters in or around your vagina.  You have blood coming from your vagina and it is not your menstrual period.  You develop pain in your abdomen. Summary  Vaginal yeast infection is a condition that causes discharge as well as soreness, swelling, and redness (inflammation) of the vagina.  This condition is treated with medicine. Medicines may be over-the-counter or prescription.  Take or apply over-the-counter and prescription medicines only as told by your health care provider.  Do not douche.   Do not have sex or use tampons until your health care provider approves.  Contact a health care provider if your symptoms do not get better with treatment or your symptoms go away and then return. This information is not intended to replace advice given to you by your health care provider. Make sure you discuss any questions you have with your health care  provider. Document Revised: 12/23/2018 Document Reviewed: 10/11/2017 Elsevier Patient Education  2020 Elsevier Inc.  

## 2020-02-27 NOTE — Telephone Encounter (Signed)
Spoke with patient. Patient reports vaginal itching, d/c and "bumpy rash" present for 1 wk. Last treated for yeast on 8/25, symptoms resolved for 2 wks.   Patient reports she has a IUD in place, hx of endometrial cancer. States started spotting 4 days ago, contacted GYN [facility] to notify, is waiting for return call to advise on f/u.   Patient is requesting to proceed with OV for vaginitis symptoms.  OV scheduled for today at 2:30pm with Dr. Talbert Nan.  Patient is agreeable to date and time.  Encounter closed.

## 2020-02-27 NOTE — Telephone Encounter (Signed)
Pt needed a letter for immigration application stating that she was being followed for the endometrial cancer. E-mailed the letter from [employer]  stating that in September 2020 that she did not have measurable disease at that time and is being followed by GYN. In speaking with Ms Hasley she stated that she had light vaginal bleeding for 3-4 days in July.   She just has had some spotting for 4 days. Reviewed with Melissa Cross,NP.   Melissa recommended that pt be seen with possible endometrial biopsy.  Pt is currently waiting for her work insurance to begin. It may begin at the end of October beginning of November. Pt stated that Dr. Gentry Fitz office has worked with her for cost.  Recommended she se Dr. Talbert Nan as the cost here at [facility] would be significantly higher. Pt actually has an appointment today with Dr. Talbert Nan for vaginal itching rash. Ms Handyside stated that she would discuss the bleeding with Dr. Talbert Nan at visit today. Will follow up on office visit to determine if an appointment needs to be made with Dr. Denman George for evaluation.

## 2020-02-29 ENCOUNTER — Telehealth: Payer: Self-pay | Admitting: Obstetrics and Gynecology

## 2020-02-29 DIAGNOSIS — N939 Abnormal uterine and vaginal bleeding, unspecified: Secondary | ICD-10-CM

## 2020-02-29 DIAGNOSIS — Z8542 Personal history of malignant neoplasm of other parts of uterus: Secondary | ICD-10-CM

## 2020-02-29 NOTE — Telephone Encounter (Signed)
Spoke with pt. Pt states need to schedule EMB per Dr Talbert Nan. Pt scheduled for 10/22 at 1130am per pt's request due to starting new job with new insurance. Pt agreeable and verbalized understanding to date and time of appt. Pt advised can take Motrin 800 mg with food and water one hour before procedure. Pt agreeable.  Cancellation policy reviewed. Pt agreeable.  Cc: Rosa for update  Encounter closed.

## 2020-02-29 NOTE — Telephone Encounter (Signed)
Call placed to convey benefits for endometrial biopsy. 

## 2020-02-29 NOTE — Telephone Encounter (Signed)
Patient returned my call and I conveyed the benefits for endometrial biopsy. Patient understands/agreeable with the benefits.  Please call the patient for scheduling. Patient states a Thursday will work best for her.

## 2020-03-21 ENCOUNTER — Telehealth: Payer: Self-pay

## 2020-03-21 NOTE — Telephone Encounter (Signed)
Encounter closed

## 2020-03-21 NOTE — Telephone Encounter (Signed)
Please disregard this message, procedure does not need to be canceled and rescheduled.

## 2020-03-21 NOTE — Telephone Encounter (Signed)
Patients EMB procedure was cancelled due to doctor being out of office. Need triage to reschedule.

## 2020-03-22 ENCOUNTER — Other Ambulatory Visit: Payer: Self-pay | Admitting: Obstetrics & Gynecology

## 2020-03-22 ENCOUNTER — Telehealth: Payer: Self-pay

## 2020-03-22 MED ORDER — FLUCONAZOLE 150 MG PO TABS: 150.0000 mg | ORAL_TABLET | Freq: Once | ORAL | 0 refills | Status: AC

## 2020-03-22 NOTE — Telephone Encounter (Signed)
Last OV 9/21- +yeast with Diflucan Rx treatment. + yeast last 3 months per lab hx  Mirena IUD, LMP 9/21-irregular per pt.  Spoke with pt. Pt states having yeast sx again for 3rd month in a row. Pt states having vaginal irritation, itching and burning. Denies vaginal odor, discharge and all UTI sx. Pt requesting Diflucan Rx over weekend to resolve sx. Pt states this is the only medication that works. Pt states tried Femiclear for 2 days this week that did not resolve sx.  Has tried and failed Monistat 3.  Pt states Dr Talbert Nan mentioned possible long term therapy suppression, unsure of treatment name. Pt has upcoming EMB on Friday 10/22 with Dr Talbert Nan and states will have follow up from current yeast sx due to work schedule.   Pt states has new PCP visit in Nov to recheck diabetes labs.   Pt advised to have OV for further evaluation. Pt declines due to work schedule and cant miss work. Pt advised we do not treat over the phone. Pt advised to be seen at [facility] since not established at PCP. Pt declines. Pt aware that Dr Talbert Nan not in office. Pt insistent on getting Rx called into pharmacy.  Pt advised will review with Dr Sabra Heck and will return call. Pt agreeable.   Routing to Dr [employer] provider

## 2020-03-22 NOTE — Telephone Encounter (Signed)
Left detailed message per DPR. Pt has EMB on 03/29/20.  Encounter closed.

## 2020-03-22 NOTE — Telephone Encounter (Signed)
Patient is having same reccuring yeast symptoms as before.

## 2020-03-22 NOTE — Telephone Encounter (Signed)
Left message for pt to return call to triage RN. 

## 2020-03-22 NOTE — Telephone Encounter (Signed)
Reviewed prior notes.  Pt has been treated for yeast vaginitis several times.  I feel ok to do this.  Would highly encourage pt to keep appt to discussed treatment for recurrent yeast.  Diflucan 150mg  po x 1, repeat 72 hours.

## 2020-03-25 ENCOUNTER — Telehealth: Payer: Self-pay

## 2020-03-25 NOTE — Telephone Encounter (Signed)
Spoke with pt. Pt states was delay in message from Friday. Pt has picked up and used Diflucan Rx and is feeling better, just having intermittent irritation. Pt advised to keep OV with Dr Talbert Nan on 03/29/20. Pt agreeable and verbalized understanding.  Encounter closed.

## 2020-03-25 NOTE — Telephone Encounter (Signed)
Patient left message returning call to [facility] regarding a medication.

## 2020-03-28 NOTE — Progress Notes (Signed)
GYNECOLOGY  VISIT   HPI: 24 y.o.   Married   Other or two or more races Unavailable Hispanic or Latino  female   G0P0000 with No LMP recorded. (Menstrual status: IUD).   here for EMB. She has a mirena IUD for management of endometrial adenocarcinoma, inserted in 3/20. She has had a couple of episodes of bleeding since July. The GYN Oncologist recommended endometrial biopsy and she is here for that.  She c/o ongoing issues with yeast vaginitis. She had a documented infection in 8/21 and 9/21. She was treated last week over the phone. Currently feeling better, but bothered by recurrent infections.   GYNECOLOGIC HISTORY: No LMP recorded. (Menstrual status: IUD). Contraception:IUD Menopausal hormone therapy: none        OB History    Gravida  0   Para  0   Term  0   Preterm  0   AB  0   Living  0     SAB  0   TAB  0   Ectopic  0   Multiple  0   Live Births  0              Patient Active Problem List   Diagnosis Date Noted  . Viral URI 12/23/2018  . Exposure to COVID-19 virus 11/18/2018  . Suspected COVID-19 virus infection 11/18/2018  . Hypothyroidism 03/02/2018  . Anovulation 03/02/2018    Past Medical History:  Diagnosis Date  . Amenorrhea   . Asthma   . Asthma    when younger, not current problem  . Complication of anesthesia    slow to awaken after tonsils and adenoid surgery yrs ago  . Dysmenorrhea   . Hormone disorder   . Vision abnormalities    wears glasses    Past Surgical History:  Procedure Laterality Date  . ADENOIDECTOMY    . APPENDECTOMY    . CARPAL TUNNEL RELEASE  12/14/2011   Procedure: CARPAL TUNNEL RELEASE;  Surgeon: Tennis Must, MD;  Location: [place];  Service: Orthopedics;  Laterality: Left;  . CARPAL TUNNEL RELEASE  01/28/2012   Procedure: CARPAL TUNNEL RELEASE;  Surgeon: Tennis Must, MD;  Location: [place];  Service: Orthopedics;  Laterality: Right;  right carpal tunnel release  .  CARPAL TUNNEL RELEASE Bilateral   . CATARACT EXTRACTION, BILATERAL    . DILATATION & CURETTAGE/HYSTEROSCOPY WITH MYOSURE N/A 08/15/2018   Procedure: DILATATION & CURETTAGE/HYSTEROSCOPY WITH MYOSURE;  Surgeon: [name], MD;  Location: [facility];  Service: Gynecology;  Laterality: N/A;  endometrial polyp  . DILATION AND CURETTAGE OF UTERUS  for miscarriage this year  . TONSILLECTOMY    . TONSILLECTOMY     and adenoid     Current Outpatient Medications  Medication Sig Dispense Refill  . albuterol (PROVENTIL HFA;VENTOLIN HFA) 108 (90 BASE) MCG/ACT inhaler Inhale 2 puffs into the lungs every 6 (six) hours as needed.    Marland Kitchen ibuprofen (ADVIL,MOTRIN) 800 MG tablet Take 1 tablet (800 mg total) by mouth every 8 (eight) hours as needed. 30 tablet 0  . levonorgestrel (MIRENA) 20 MCG/24HR IUD 1 Intra Uterine Device (1 each total) by Intrauterine route once for 1 dose. To treat endometrial cancer, fertility sparing 1 each 0  . clobetasol ointment (TEMOVATE) 7.06 % Apply 1 application topically 2 (two) times daily. Apply as directed twice daily (Patient not taking: Reported on 03/29/2020) 60 g 0   No current facility-administered medications for this visit.  ALLERGIES: Betadine [povidone iodine], Amoxicillin, Amoxicillin, and Penicillins  Family History  Problem Relation Age of Onset  . Depression Brother   . Drug abuse Brother   . Heart disease Maternal Grandmother   . Hypertension Maternal Grandmother   . Hypertension Paternal Grandmother   . Depression Paternal Grandmother     Social History   Socioeconomic History  . Marital status: Married    Spouse name: Not on file  . Number of children: Not on file  . Years of education: Not on file  . Highest education level: Not on file  Occupational History  . Not on file  Tobacco Use  . Smoking status: Never Smoker  . Smokeless tobacco: Never Used  Vaping Use  . Vaping Use: Never used  Substance and Sexual  Activity  . Alcohol use: No  . Drug use: No  . Sexual activity: Yes    Birth control/protection: I.U.D.    Comment: had miscarriage may 2013  Other Topics Concern  . Not on file  Social History Narrative   ** Merged History Encounter **       Social Determinants of Health   Financial Resource Strain:   . Difficulty of Paying Living Expenses: Not on file  Food Insecurity:   . Worried About Charity fundraiser in the Last Year: Not on file  . Ran Out of Food in the Last Year: Not on file  Transportation Needs:   . Lack of Transportation (Medical): Not on file  . Lack of Transportation (Non-Medical): Not on file  Physical Activity:   . Days of Exercise per Week: Not on file  . Minutes of Exercise per Session: Not on file  Stress:   . Feeling of Stress : Not on file  Social Connections:   . Frequency of Communication with Friends and Family: Not on file  . Frequency of Social Gatherings with Friends and Family: Not on file  . Attends Religious Services: Not on file  . Active Member of Clubs or Organizations: Not on file  . Attends Archivist Meetings: Not on file  . Marital Status: Not on file  Intimate Partner Violence:   . Fear of Current or Ex-Partner: Not on file  . Emotionally Abused: Not on file  . Physically Abused: Not on file  . Sexually Abused: Not on file    Review of Systems  Constitutional: Negative.   HENT: Negative.   Eyes: Negative.   Respiratory: Negative.   Cardiovascular: Negative.   Gastrointestinal: Negative.   Genitourinary: Negative.   Musculoskeletal: Negative.   Skin: Negative.   Neurological: Negative.   Endo/Heme/Allergies: Negative.   Psychiatric/Behavioral: Negative.     PHYSICAL EXAMINATION:    BP 100/70 (BP Location: Right Arm, Patient Position: Sitting, Cuff Size: Large)   Pulse 78   Ht 5\' 3"  (1.6 m)   Wt 290 lb (131.5 kg)   SpO2 99%   BMI 51.37 kg/m     General appearance: alert, cooperative and appears stated  age   Pelvic: External genitalia:  no lesions              Urethra:  normal appearing urethra with no masses, tenderness or lesions              Bartholins and Skenes: normal                 Vagina: erythematous appearing vagina with a slight increase in white vaginal d/c  Cervix: no lesions and IUD string 3 cm              The risks of endometrial biopsy were reviewed and a consent was obtained.  A speculum was placed in the vagina and the cervix was cleansed with betadine. A tenaculum was placed on the cervix and the pipelle was placed into the endometrial cavity. The uterus sounded to 7 cm. The endometrial biopsy was performed, taking care to get a representative sample, sampling 360 degrees of the uterine cavity. moderate tissue was obtained. The tenaculum and speculum were removed. There were no complications.   Chaperone was present for exam.  Wet prep: no clue, no trich, + wbc KOH: no yeast PH: 4   ASSESSMENT H/O endometrial cancer Bleeding with the mirena IUD Recurrent yeast vaginitis. She has had 2 documented infections since 8/21, she was treated over the phone last week for recurrent symptoms. Her vagina is erythematous, but no yeast on vaginal slides today    PLAN Endometrial biopsy done, further plans depending on results C/o recurrent yeast. If she has one more documented infection will treat with suppression She will return for an annual exam and screening labs.

## 2020-03-29 ENCOUNTER — Other Ambulatory Visit: Payer: Self-pay

## 2020-03-29 ENCOUNTER — Encounter: Payer: Self-pay | Admitting: Obstetrics and Gynecology

## 2020-03-29 ENCOUNTER — Ambulatory Visit (INDEPENDENT_AMBULATORY_CARE_PROVIDER_SITE_OTHER): Payer: Self-pay | Admitting: Obstetrics and Gynecology

## 2020-03-29 ENCOUNTER — Other Ambulatory Visit (HOSPITAL_COMMUNITY)
Admission: RE | Admit: 2020-03-29 | Discharge: 2020-03-29 | Disposition: A | Discharge status: Home / Self Care | Payer: BC Managed Care – PPO | Source: Ambulatory Visit | Attending: Obstetrics and Gynecology | Admitting: Obstetrics and Gynecology

## 2020-03-29 ENCOUNTER — Ambulatory Visit: Payer: Self-pay | Admitting: Obstetrics and Gynecology

## 2020-03-29 VITALS — BP 100/70 | HR 78 | Ht 63.0 in | Wt 290.0 lb

## 2020-03-29 DIAGNOSIS — Z01812 Encounter for preprocedural laboratory examination: Secondary | ICD-10-CM

## 2020-03-29 DIAGNOSIS — Z8542 Personal history of malignant neoplasm of other parts of uterus: Secondary | ICD-10-CM | POA: Insufficient documentation

## 2020-03-29 DIAGNOSIS — N760 Acute vaginitis: Secondary | ICD-10-CM

## 2020-03-29 LAB — POCT URINE PREGNANCY: Preg Test, Ur: NEGATIVE

## 2020-03-29 NOTE — Addendum Note (Signed)
Addended by: Dorothy Spark on: 03/29/2020 04:56 PM   Modules accepted: Orders

## 2020-03-29 NOTE — Patient Instructions (Signed)

## 2020-04-02 LAB — SURGICAL PATHOLOGY

## 2020-04-05 NOTE — Telephone Encounter (Signed)
Pt had an endometrial bx with Dr. Talbert Nan on 03-29-20.

## 2020-04-26 ENCOUNTER — Telehealth: Payer: Self-pay

## 2020-04-26 ENCOUNTER — Ambulatory Visit: Payer: BC Managed Care – PPO | Admitting: Obstetrics and Gynecology

## 2020-04-26 NOTE — Telephone Encounter (Signed)
Patient states she would like to come in for recurring problem.

## 2020-04-26 NOTE — Telephone Encounter (Signed)
Mirena IUD placed 08/2018 H/o recurrent yeast sx, Neg Nuswab 10/2019, Neg wet prep 10/21  Pt returned call. Spoke with pt. Pt states having recurrent yeast sx again for last 5 days. Pt states having vaginal discharge, itching and slight odor. Denies fever, chills, abd pain/cramps.  Pt states used boric acid suppositories for 4 days on 11/7-11/10 then it was better and symptoms reappeared on 11/15.   Pt advised to have OV. Pt agreeable. Pt states only available on Monday due to work schedule. Pt scheduled as work-in appt on 11/22 at 145 pm with Dr Talbert Nan. Pt verbalized understanding of date and time of appt.  Routing to Dr Talbert Nan for review Encounter closed

## 2020-04-26 NOTE — Telephone Encounter (Signed)
Left message for pt to return call to triage RN.  Pt has recurrent yeast vaginitis hx.

## 2020-04-29 ENCOUNTER — Other Ambulatory Visit: Payer: Self-pay

## 2020-04-29 ENCOUNTER — Encounter: Payer: Self-pay | Admitting: Obstetrics and Gynecology

## 2020-04-29 ENCOUNTER — Ambulatory Visit: Payer: BLUE CROSS/BLUE SHIELD | Admitting: Obstetrics and Gynecology

## 2020-04-29 VITALS — BP 116/72 | HR 71 | Ht 63.0 in | Wt 287.4 lb

## 2020-04-29 DIAGNOSIS — N760 Acute vaginitis: Secondary | ICD-10-CM

## 2020-04-29 MED ORDER — FLUCONAZOLE 150 MG PO TABS: ORAL_TABLET | ORAL | 1 refills | Status: DC

## 2020-04-29 NOTE — Progress Notes (Signed)
GYNECOLOGY  VISIT   HPI: 24 y.o.   Married   Other or two or more races Unavailable Hispanic or Latino  female   G0P0000 with No LMP recorded. (Menstrual status: IUD).   here for possible yeast. She states that she tried the boric acid for 4 nights and felt better. She states that she felt better for 2 days then the irritation came back.   She had a documented yeast infection in 8/21 and 9/21.  Symptoms started 2 weeks ago, took boric acid for 4 nights. Symptoms improved but didn't go away. She hasn't used the boric acid for over a week. Very itchy, some increase in thick, white/gray vaginal d/c. No odor.   GYNECOLOGIC HISTORY: No LMP recorded. (Menstrual status: IUD). Contraception:IUD Menopausal hormone therapy: none        OB History    Gravida  0   Para  0   Term  0   Preterm  0   AB  0   Living  0     SAB  0   TAB  0   Ectopic  0   Multiple  0   Live Births  0              Patient Active Problem List   Diagnosis Date Noted   Viral URI 12/23/2018   Exposure to COVID-19 virus 11/18/2018   Suspected COVID-19 virus infection 11/18/2018   Hypothyroidism 03/02/2018   Anovulation 03/02/2018    Past Medical History:  Diagnosis Date   Amenorrhea    Asthma    Asthma    when younger, not current problem   Complication of anesthesia    slow to awaken after tonsils and adenoid surgery yrs ago   Dysmenorrhea    Hormone disorder    Vision abnormalities    wears glasses    Past Surgical History:  Procedure Laterality Date   ADENOIDECTOMY     APPENDECTOMY     CARPAL TUNNEL RELEASE  12/14/2011   Procedure: CARPAL TUNNEL RELEASE;  Surgeon: Tennis Must, MD;  Location: [place];  Service: Orthopedics;  Laterality: Left;   CARPAL TUNNEL RELEASE  01/28/2012   Procedure: CARPAL TUNNEL RELEASE;  Surgeon: Tennis Must, MD;  Location: [place];  Service: Orthopedics;  Laterality: Right;  right carpal tunnel  release   CARPAL TUNNEL RELEASE Bilateral    CATARACT EXTRACTION, BILATERAL     DILATATION & CURETTAGE/HYSTEROSCOPY WITH MYOSURE N/A 08/15/2018   Procedure: DILATATION & CURETTAGE/HYSTEROSCOPY WITH MYOSURE;  Surgeon: [name], MD;  Location: [place];  Service: Gynecology;  Laterality: N/A;  endometrial polyp   DILATION AND CURETTAGE OF UTERUS  for miscarriage this year   TONSILLECTOMY     TONSILLECTOMY     and adenoid     Current Outpatient Medications  Medication Sig Dispense Refill   albuterol (PROVENTIL HFA;VENTOLIN HFA) 108 (90 BASE) MCG/ACT inhaler Inhale 2 puffs into the lungs every 6 (six) hours as needed.     clobetasol ointment (TEMOVATE) 2.72 % Apply 1 application topically 2 (two) times daily. Apply as directed twice daily 60 g 0   levonorgestrel (MIRENA) 20 MCG/24HR IUD 1 Intra Uterine Device (1 each total) by Intrauterine route once for 1 dose. To treat endometrial cancer, fertility sparing 1 each 0   No current facility-administered medications for this visit.     ALLERGIES: Betadine [povidone iodine], Amoxicillin, Amoxicillin, and Penicillins  Family History  Problem Relation Age  of Onset   Depression Brother    Drug abuse Brother    Heart disease Maternal Grandmother    Hypertension Maternal Grandmother    Hypertension Paternal Grandmother    Depression Paternal Grandmother     Social History   Socioeconomic History   Marital status: Married    Spouse name: Not on file   Number of children: Not on file   Years of education: Not on file   Highest education level: Not on file  Occupational History   Not on file  Tobacco Use   Smoking status: Never Smoker   Smokeless tobacco: Never Used  Vaping Use   Vaping Use: Never used  Substance and Sexual Activity   Alcohol use: No   Drug use: No   Sexual activity: Yes    Birth control/protection: I.U.D.    Comment: had miscarriage may 2013  Other Topics  Concern   Not on file  Social History Narrative   ** Merged History Encounter **       Social Determinants of Health   Financial Resource Strain:    Difficulty of Paying Living Expenses: Not on file  Food Insecurity:    Worried About Charity fundraiser in the Last Year: Not on file   [employer] of Food in the Last Year: Not on file  Transportation Needs:    Lack of Transportation (Medical): Not on file   Lack of Transportation (Non-Medical): Not on file  Physical Activity:    Days of Exercise per Week: Not on file   Minutes of Exercise per Session: Not on file  Stress:    Feeling of Stress : Not on file  Social Connections:    Frequency of Communication with Friends and Family: Not on file   Frequency of Social Gatherings with Friends and Family: Not on file   Attends Religious Services: Not on file   Active Member of Clubs or Organizations: Not on file   Attends Archivist Meetings: Not on file   Marital Status: Not on file  Intimate Partner Violence:    Fear of Current or Ex-Partner: Not on file   Emotionally Abused: Not on file   Physically Abused: Not on file   Sexually Abused: Not on file    Review of Systems  Genitourinary:       Vaginal Itching    PHYSICAL EXAMINATION:    BP 116/72    Pulse 71    Ht 5\' 3"  (1.6 m)    Wt 287 lb 6.4 oz (130.4 kg)    SpO2 97%    BMI 50.91 kg/m     General appearance: alert, cooperative and appears stated age  Pelvic: External genitalia:  no lesions, +erythema              Urethra:  normal appearing urethra with no masses, tenderness or lesions              Bartholins and Skenes: normal                 Vagina: erythematous appearing vagina with a slight increase in watery, white vaginal d/c and a slight increase in thick white vaginal d/c             Cervix: no lesions and IUD string seen               Chaperone was present for exam.  Wet prep: ? clue, no trich, + wbc KOH: ++ yeast PH:  5  ASSESSMENT Recurrent yeast vaginitis ?BV as well    PLAN Treat with diflucan 150 mg po q 72 hours x 3 doses, then 1 x a week x 6 months Send nuswab just for BV She has steroid ointment for prn use.   Addendum: she was asking about medical weight loss, Dr Alcario Drought card was given. Discussed that it would be much healthier for her to loose weight prior to pregnancy/

## 2020-05-03 LAB — BACTERIAL VAGINOSIS, NAA

## 2020-05-15 ENCOUNTER — Encounter (INDEPENDENT_AMBULATORY_CARE_PROVIDER_SITE_OTHER): Payer: Self-pay

## 2020-05-29 ENCOUNTER — Ambulatory Visit: Payer: BC Managed Care – PPO | Admitting: Obstetrics and Gynecology

## 2020-06-17 ENCOUNTER — Other Ambulatory Visit: Payer: Self-pay

## 2020-06-17 ENCOUNTER — Encounter (INDEPENDENT_AMBULATORY_CARE_PROVIDER_SITE_OTHER): Payer: Self-pay | Admitting: Bariatrics

## 2020-06-17 ENCOUNTER — Ambulatory Visit (INDEPENDENT_AMBULATORY_CARE_PROVIDER_SITE_OTHER): Payer: BC Managed Care – PPO | Admitting: Bariatrics

## 2020-06-17 VITALS — BP 136/89 | HR 82 | Temp 98.0°F | Ht 64.0 in | Wt 284.0 lb

## 2020-06-17 DIAGNOSIS — E039 Hypothyroidism, unspecified: Secondary | ICD-10-CM

## 2020-06-17 DIAGNOSIS — Z9189 Other specified personal risk factors, not elsewhere classified: Secondary | ICD-10-CM

## 2020-06-17 DIAGNOSIS — E559 Vitamin D deficiency, unspecified: Secondary | ICD-10-CM | POA: Diagnosis not present

## 2020-06-17 DIAGNOSIS — R7303 Prediabetes: Secondary | ICD-10-CM

## 2020-06-17 DIAGNOSIS — R5383 Other fatigue: Secondary | ICD-10-CM

## 2020-06-17 DIAGNOSIS — R0602 Shortness of breath: Secondary | ICD-10-CM

## 2020-06-17 DIAGNOSIS — R7309 Other abnormal glucose: Secondary | ICD-10-CM

## 2020-06-17 DIAGNOSIS — Z6841 Body Mass Index (BMI) 40.0 and over, adult: Secondary | ICD-10-CM

## 2020-06-17 DIAGNOSIS — Z1331 Encounter for screening for depression: Secondary | ICD-10-CM

## 2020-06-17 DIAGNOSIS — Z0289 Encounter for other administrative examinations: Secondary | ICD-10-CM

## 2020-06-17 DIAGNOSIS — E282 Polycystic ovarian syndrome: Secondary | ICD-10-CM | POA: Diagnosis not present

## 2020-06-18 ENCOUNTER — Other Ambulatory Visit (INDEPENDENT_AMBULATORY_CARE_PROVIDER_SITE_OTHER): Payer: Self-pay | Admitting: Bariatrics

## 2020-06-18 ENCOUNTER — Encounter (INDEPENDENT_AMBULATORY_CARE_PROVIDER_SITE_OTHER): Payer: Self-pay | Admitting: Bariatrics

## 2020-06-18 ENCOUNTER — Telehealth (INDEPENDENT_AMBULATORY_CARE_PROVIDER_SITE_OTHER): Payer: Self-pay | Admitting: Bariatrics

## 2020-06-18 DIAGNOSIS — E1169 Type 2 diabetes mellitus with other specified complication: Secondary | ICD-10-CM | POA: Insufficient documentation

## 2020-06-18 DIAGNOSIS — E669 Obesity, unspecified: Secondary | ICD-10-CM

## 2020-06-18 LAB — INSULIN, RANDOM: INSULIN: 54.7 u[IU]/mL — ABNORMAL HIGH (ref 2.6–24.9)

## 2020-06-18 LAB — COMPREHENSIVE METABOLIC PANEL
ALT: 96 IU/L — ABNORMAL HIGH (ref 0–32)
AST: 39 IU/L (ref 0–40)
Albumin/Globulin Ratio: 2.4 — ABNORMAL HIGH (ref 1.2–2.2)
Albumin: 4.8 g/dL (ref 3.9–5.0)
Alkaline Phosphatase: 88 IU/L (ref 44–121)
BUN/Creatinine Ratio: 14 (ref 9–23)
BUN: 8 mg/dL (ref 6–20)
Bilirubin Total: 0.4 mg/dL (ref 0.0–1.2)
CO2: 21 mmol/L (ref 20–29)
Calcium: 9.5 mg/dL (ref 8.7–10.2)
Chloride: 99 mmol/L (ref 96–106)
Creatinine, Ser: 0.59 mg/dL (ref 0.57–1.00)
GFR calc Af Amer: 146 mL/min/{1.73_m2} (ref >59)
GFR calc non Af Amer: 127 mL/min/{1.73_m2} (ref >59)
Globulin, Total: 2 g/dL (ref 1.5–4.5)
Glucose: 267 mg/dL — ABNORMAL HIGH (ref 65–99)
Potassium: 4.5 mmol/L (ref 3.5–5.2)
Sodium: 136 mmol/L (ref 134–144)
Total Protein: 6.8 g/dL (ref 6.0–8.5)

## 2020-06-18 LAB — LIPID PANEL WITH LDL/HDL RATIO
Cholesterol, Total: 231 mg/dL — ABNORMAL HIGH (ref 100–199)
HDL: 32 mg/dL — ABNORMAL LOW (ref >39)
LDL Chol Calc (NIH): 138 mg/dL — ABNORMAL HIGH (ref 0–99)
LDL/HDL Ratio: 4.3 ratio — ABNORMAL HIGH (ref 0.0–3.2)
Triglycerides: 337 mg/dL — ABNORMAL HIGH (ref 0–149)
VLDL Cholesterol Cal: 61 mg/dL — ABNORMAL HIGH (ref 5–40)

## 2020-06-18 LAB — CBC WITH DIFFERENTIAL/PLATELET
Basophils Absolute: 0.1 10*3/uL (ref 0.0–0.2)
Basos: 1 %
EOS (ABSOLUTE): 0.2 10*3/uL (ref 0.0–0.4)
Eos: 4 %
Hematocrit: 44.2 % (ref 34.0–46.6)
Hemoglobin: 15.1 g/dL (ref 11.1–15.9)
Immature Grans (Abs): 0 10*3/uL (ref 0.0–0.1)
Immature Granulocytes: 0 %
Lymphocytes Absolute: 2.3 10*3/uL (ref 0.7–3.1)
Lymphs: 36 %
MCH: 28.7 pg (ref 26.6–33.0)
MCHC: 34.2 g/dL (ref 31.5–35.7)
MCV: 84 fL (ref 79–97)
Monocytes Absolute: 0.4 10*3/uL (ref 0.1–0.9)
Monocytes: 6 %
Neutrophils Absolute: 3.4 10*3/uL (ref 1.4–7.0)
Neutrophils: 53 %
Platelets: 285 10*3/uL (ref 150–450)
RBC: 5.26 x10E6/uL (ref 3.77–5.28)
RDW: 12.1 % (ref 11.7–15.4)
WBC: 6.3 10*3/uL (ref 3.4–10.8)

## 2020-06-18 LAB — TSH: TSH: 1.54 u[IU]/mL (ref 0.450–4.500)

## 2020-06-18 LAB — HEMOGLOBIN A1C
Est. average glucose Bld gHb Est-mCnc: 280 mg/dL
Hgb A1c MFr Bld: 11.4 % — ABNORMAL HIGH (ref 4.8–5.6)

## 2020-06-18 LAB — T3: T3, Total: 142 ng/dL (ref 71–180)

## 2020-06-18 LAB — VITAMIN B12: Vitamin B-12: 611 pg/mL (ref 232–1245)

## 2020-06-18 LAB — T4, FREE: Free T4: 1.45 ng/dL (ref 0.82–1.77)

## 2020-06-18 LAB — VITAMIN D 25 HYDROXY (VIT D DEFICIENCY, FRACTURES): Vit D, 25-Hydroxy: 5.6 ng/mL — ABNORMAL LOW (ref 30.0–100.0)

## 2020-06-18 MED ORDER — METFORMIN HCL ER 750 MG PO TB24: 750.0000 mg | ORAL_TABLET | Freq: Every day | ORAL | 0 refills | Status: DC

## 2020-06-18 MED ORDER — BLOOD GLUCOSE MONITOR KIT: 1.0000 | PACK | Freq: Two times a day (BID) | 0 refills | Status: DC

## 2020-06-18 MED ORDER — VITAMIN D (ERGOCALCIFEROL) 1.25 MG (50000 UNIT) PO CAPS: 50000.0000 [IU] | ORAL_CAPSULE | ORAL | 0 refills | Status: DC

## 2020-06-19 ENCOUNTER — Encounter (INDEPENDENT_AMBULATORY_CARE_PROVIDER_SITE_OTHER): Payer: Self-pay | Admitting: Bariatrics

## 2020-06-19 ENCOUNTER — Other Ambulatory Visit (INDEPENDENT_AMBULATORY_CARE_PROVIDER_SITE_OTHER): Payer: Self-pay

## 2020-06-19 DIAGNOSIS — E1169 Type 2 diabetes mellitus with other specified complication: Secondary | ICD-10-CM

## 2020-06-19 MED ORDER — GLUCOSE BLOOD VI STRP: ORAL_STRIP | 12 refills | Status: DC

## 2020-06-19 MED ORDER — LANCETS 30G MISC: 0 refills | Status: DC

## 2020-06-19 MED ORDER — BLOOD GLUCOSE MONITOR KIT: PACK | 0 refills | Status: DC

## 2020-06-19 NOTE — Progress Notes (Unsigned)
Chief Complaint:   Melissa Riggs (MR# [id number]) is a 25 y.o. female who presents for evaluation and treatment of obesity and related comorbidities. Current BMI is Body mass index is 48.75 kg/m. Melissa Riggs has been struggling with her weight for many years and has been unsuccessful in either losing weight, maintaining weight loss, or reaching her healthy weight goal.  Melissa Riggs is currently in the action stage of change and ready to dedicate time achieving and maintaining a healthier weight. Melissa Riggs is interested in becoming our patient and working on intensive lifestyle modifications including (but not limited to) diet and exercise for weight loss.  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  She craves carbohydrates.  She considers herself a picky eater.  She skips breakfast most days.  Melissa Riggs's habits were reviewed today and are as follows: Her family eats meals together, she thinks her family will eat healthier with her, her desired weight loss is 110-140 pounds, she has been heavy most of her life, she started gaining excessive weight in 2017, her heaviest weight ever was 293 pounds, she is a picky eater and doesn't like to eat healthier foods, she craves pasta, tacos, and [place] food, she snacks frequently in the evenings, she skips breakfast almost daily and sometimes lunch, she is frequently drinking liquids with calories, she frequently makes poor food choices, she frequently eats larger portions than normal and she struggles with emotional eating.  Depression Screen Shamiyah's Food and Mood (modified PHQ-9) score was 10.  Depression screen PHQ 2/9 06/17/2020  Decreased Interest 2  Down, Depressed, Hopeless 1  PHQ - 2 Score 3  Altered sleeping 1  Tired, decreased energy 2  Change in appetite 2  Feeling bad or failure about yourself  0  Trouble concentrating 2  Moving slowly or fidgety/restless 0  Suicidal thoughts 0  PHQ-9 Score 10  Difficult  doing work/chores Not difficult at all   Subjective:   1. Other fatigue Demisha denies daytime somnolence and reports waking up still tired. Patent has a history of symptoms of morning fatigue. Rochel generally gets 6-8 hours of sleep per night, and states that she has difficulty falling asleep. Snoring is sometimes present. Apneic episodes are not present. Epworth Sleepiness Score is 5.  2. SOB (shortness of breath) on exertion Aliveah notes increasing shortness of breath with exercising and seems to be worsening over time with weight gain. She notes getting out of breath sooner with activity than she used to. This has gotten worse recently. Ofilia denies shortness of breath at rest or orthopnea.  3. PCOS (polycystic ovarian syndrome) Trinitee is on no medication for PCOS.  She has an IUD in place.  4. Hypothyroidism, unspecified type No medications, and she states that her thyroid is normal.  She endorses heat/cold intolerance.  She has increased thirst.   5. Prediabetes Hannah states she has the diagnosis of prediabetes.  She is on no medications for this.  6. Vitamin D deficiency She is currently taking no vitamin D supplement.  7. Depression screening Danial was screened for depression as part of her new patient workup today.  PHQ-9 is 10.  8. At risk for activity intolerance Dymphna is at risk for activity intolerance due to fatigue and shortness of breath.  Assessment/Plan:   1. Other fatigue Elloise does feel that her weight is causing her energy to be lower than it should be. Fatigue may be related to obesity, depression or many other causes.  Labs will be ordered, and in the meanwhile, [place] will focus on self care including making healthy food choices, increasing physical activity and focusing on stress reduction.  - Vitamin B12 - CBC with Differential/Platelet - T3 - T4, free - TSH - EKG 12-Lead - TSH+T4F+T3Free  2. SOB (shortness of breath) on  exertion Sabreen does feel that she gets out of breath more easily that she used to when she exercises. Etheline's shortness of breath appears to be obesity related and exercise induced. She has agreed to work on weight loss and gradually increase exercise to treat her exercise induced shortness of breath. Will continue to monitor closely.  - TSH+T4F+T3Free  3. PCOS (polycystic ovarian syndrome) Intensive lifestyle modifications are first line treatment for this issue. We discussed several lifestyle modifications today and she will continue to work on diet, exercise and weight loss efforts.   Counseling . PCOS is a leading cause of menstrual irregularities and infertility. It is also associated with obesity, hirsutism (excessive hair growth on the face, chest, or back), and cardiovascular risk factors such as high cholesterol and insulin resistance. . Insulin resistance appears to play a central role.  . Women with PCOS have been shown to have impaired appetite-regulating hormones. . Metformin is one medication that can improve metabolic parameters.  . Women with polycystic ovary syndrome (PCOS) have an increased risk for cardiovascular disease (CVD) - European Journal of Preventive Cardiology.  4. Hypothyroidism, unspecified type Will check thyroid panel today, as per below.  - TSH+T4F+T3Free  5. Prediabetes Apollonia will continue to work on weight loss, exercise, and decreasing simple carbohydrates to help decrease the risk of diabetes.  Will check A1c, insulin level, CMP, and lipid panel today.  - Insulin, random - Hemoglobin A1c - Lipid Panel With LDL/HDL Ratio - Comprehensive metabolic panel  6. Vitamin D deficiency Low Vitamin D level contributes to fatigue and are associated with obesity, breast, and colon cancer.  Will check vitamin D level today.  - VITAMIN D 25 Hydroxy (Vit-D Deficiency, Fractures)  7. Depression screening Courtnie had a positive depression screening.  Depression is commonly associated with obesity and often results in emotional eating behaviors. We will monitor this closely and work on CBT to help improve the non-hunger eating patterns. Referral to Psychology may be required if no improvement is seen as she continues in our clinic.  8. At risk for activity intolerance Sumie was given approximately 15 minutes of exercise intolerance counseling today. She is 25 y.o. female and has risk factors exercise intolerance including obesity. We discussed intensive lifestyle modifications today with an emphasis on specific weight loss instructions and strategies. Marrie will slowly increase activity as tolerated.  Repetitive spaced learning was employed today to elicit superior memory formation and behavioral change.  9. Class 3 severe obesity with serious comorbidity and body mass index (BMI) of 45.0 to 49.9 in adult, unspecified obesity type (HCC)  Yaira is currently in the action stage of change and her goal is to continue with weight loss efforts. I recommend Charleigh begin the structured treatment plan as follows:  She has agreed to the Category 2 Plan +100 calories.  She will work on meal planning, stopping all sugary drinks, and working on portion sizes.  Exercise goals: No exercise has been prescribed at this time.   Behavioral modification strategies: increasing lean protein intake, decreasing simple carbohydrates, increasing vegetables, increasing water intake, decreasing eating out, no skipping meals, meal planning and cooking strategies, keeping healthy foods in the home  and planning for success.  She was informed of the importance of frequent follow-up visits to maximize her success with intensive lifestyle modifications for her multiple health conditions. She was informed we would discuss her lab results at her next visit unless there is a critical issue that needs to be addressed sooner. Jesalyn agreed to keep her next visit at the  agreed upon time to discuss these results.  Objective:   Blood pressure 136/89, pulse 82, temperature 98 F (36.7 C), temperature source Oral, height 5\' 4"  (1.626 m), weight 284 lb (128.8 kg), last menstrual period 06/02/2020, SpO2 97 %. Body mass index is 48.75 kg/m.  EKG: Normal sinus rhythm, rate 78 bpm.  Indirect Calorimeter completed today shows a VO2 of 264 and a REE of 1840.  Her calculated basal metabolic rate is 0762 thus her basal metabolic rate is worse than expected.  General: Cooperative, alert, well developed, in no acute distress. HEENT: Conjunctivae and lids unremarkable. Cardiovascular: Regular rhythm.  Lungs: Normal work of breathing. Neurologic: No focal deficits.   Lab Results  Component Value Date   CREATININE 0.59 06/17/2020   BUN 8 06/17/2020   NA 136 06/17/2020   K 4.5 06/17/2020   CL 99 06/17/2020   CO2 21 06/17/2020   Lab Results  Component Value Date   ALT 96 (H) 06/17/2020   AST 39 06/17/2020   ALKPHOS 88 06/17/2020   BILITOT 0.4 06/17/2020   Lab Results  Component Value Date   HGBA1C 11.4 (H) 06/17/2020   HGBA1C 5.9 03/02/2018   Lab Results  Component Value Date   INSULIN 54.7 (H) 06/17/2020   Lab Results  Component Value Date   TSH 1.540 06/17/2020   Lab Results  Component Value Date   CHOL 231 (H) 06/17/2020   HDL 32 (L) 06/17/2020   LDLCALC 138 (H) 06/17/2020   TRIG 337 (H) 06/17/2020   Lab Results  Component Value Date   WBC 6.3 06/17/2020   HGB 15.1 06/17/2020   HCT 44.2 06/17/2020   MCV 84 06/17/2020   PLT 285 06/17/2020   Attestation Statements:   Reviewed by clinician on day of visit: allergies, medications, problem list, medical history, surgical history, family history, social history, and previous encounter notes.  I, Water quality scientist, CMA, am acting as Location manager for [employer], DO  I have reviewed the above documentation for accuracy and completeness, and I agree with the above. Jearld Lesch, DO

## 2020-06-19 NOTE — Telephone Encounter (Signed)
Call pharmacy to check on a glucometer that is covered.

## 2020-06-19 NOTE — Telephone Encounter (Signed)
I called, and sent in test strips, lancets and the glucose kit

## 2020-06-27 ENCOUNTER — Other Ambulatory Visit: Payer: BC Managed Care – PPO

## 2020-06-27 DIAGNOSIS — Z20822 Contact with and (suspected) exposure to covid-19: Secondary | ICD-10-CM | POA: Diagnosis not present

## 2020-06-28 LAB — SARS-COV-2, NAA 2 DAY TAT

## 2020-06-28 LAB — NOVEL CORONAVIRUS, NAA: SARS-CoV-2, NAA: DETECTED — AB

## 2020-06-30 ENCOUNTER — Telehealth: Payer: Self-pay | Admitting: Unknown Physician Specialty

## 2020-06-30 NOTE — Telephone Encounter (Signed)
Called to discuss with patient about COVID-19 symptoms and the use of one of the available treatments for those with mild to moderate Covid symptoms and at a high risk of hospitalization.  Pt appears to qualify for outpatient treatment due to co-morbid conditions and/or a member of an at-risk group in accordance with the FDA Emergency Use Authorization.    Symptom onset: ? Vaccinated: ? Booster? ? Immunocompromised? no Qualifiers: DM, obesity  Unable to reach pt - LMOM and mychart  [employer]

## 2020-07-01 ENCOUNTER — Telehealth (INDEPENDENT_AMBULATORY_CARE_PROVIDER_SITE_OTHER): Payer: BC Managed Care – PPO | Admitting: Bariatrics

## 2020-07-01 ENCOUNTER — Ambulatory Visit (HOSPITAL_COMMUNITY)
Admission: RE | Admit: 2020-07-01 | Discharge: 2020-07-01 | Disposition: A | Discharge status: Home / Self Care | Payer: BC Managed Care – PPO | Source: Ambulatory Visit | Attending: Pulmonary Disease | Admitting: Pulmonary Disease

## 2020-07-01 ENCOUNTER — Other Ambulatory Visit (HOSPITAL_COMMUNITY): Payer: Self-pay | Admitting: Adult Health

## 2020-07-01 ENCOUNTER — Encounter: Payer: Self-pay | Admitting: Adult Health

## 2020-07-01 DIAGNOSIS — E1169 Type 2 diabetes mellitus with other specified complication: Secondary | ICD-10-CM | POA: Diagnosis not present

## 2020-07-01 DIAGNOSIS — E669 Obesity, unspecified: Secondary | ICD-10-CM

## 2020-07-01 DIAGNOSIS — R748 Abnormal levels of other serum enzymes: Secondary | ICD-10-CM

## 2020-07-01 DIAGNOSIS — U071 COVID-19: Secondary | ICD-10-CM

## 2020-07-01 DIAGNOSIS — E559 Vitamin D deficiency, unspecified: Secondary | ICD-10-CM | POA: Diagnosis not present

## 2020-07-01 DIAGNOSIS — Z6841 Body Mass Index (BMI) 40.0 and over, adult: Secondary | ICD-10-CM

## 2020-07-01 MED ORDER — DIPHENHYDRAMINE HCL 50 MG/ML IJ SOLN: 50.0000 mg | Freq: Once | INTRAMUSCULAR | Status: DC | PRN

## 2020-07-01 MED ORDER — EPINEPHRINE 0.3 MG/0.3ML IJ SOAJ: 0.3000 mg | Freq: Once | INTRAMUSCULAR | Status: DC | PRN

## 2020-07-01 MED ORDER — FAMOTIDINE IN NACL 20-0.9 MG/50ML-% IV SOLN: 20.0000 mg | Freq: Once | INTRAVENOUS | Status: DC | PRN

## 2020-07-01 MED ORDER — OZEMPIC (0.25 OR 0.5 MG/DOSE) 2 MG/1.5ML ~~LOC~~ SOPN: 0.2500 mg | PEN_INJECTOR | SUBCUTANEOUS | 0 refills | Status: DC

## 2020-07-01 MED ORDER — ALBUTEROL SULFATE HFA 108 (90 BASE) MCG/ACT IN AERS: 2.0000 | INHALATION_SPRAY | Freq: Once | RESPIRATORY_TRACT | Status: DC | PRN

## 2020-07-01 MED ORDER — SOTROVIMAB 500 MG/8ML IV SOLN
500.0000 mg | Freq: Once | INTRAVENOUS | Status: AC
  Administered 2020-07-01: 500 mg via INTRAVENOUS

## 2020-07-01 MED ORDER — METHYLPREDNISOLONE SODIUM SUCC 125 MG IJ SOLR: 125.0000 mg | Freq: Once | INTRAMUSCULAR | Status: DC | PRN

## 2020-07-01 MED ORDER — SODIUM CHLORIDE 0.9 % IV SOLN: INTRAVENOUS | Status: DC | PRN

## 2020-07-01 NOTE — Progress Notes (Signed)
Diagnosis: COVID-19  Physician: Dr. Patrick Wright  Procedure: [facility] Infusion [facility] Med: Sotrovimab infusion - Provided patient with sotrovimab fact sheet for patients, parents, and caregivers prior to infusion.   Complications: No immediate complications noted  Discharge: Discharged home    

## 2020-07-01 NOTE — Progress Notes (Signed)
Patient reviewed Fact Sheet for Patients, Parents, and Caregivers for Emergency Use Authorization (EUA) of sotrovimab for the Treatment of Coronavirus. Patient also reviewed and is agreeable to the estimated cost of treatment. Patient is agreeable to proceed.   

## 2020-07-01 NOTE — Progress Notes (Signed)
I connected by phone with Melissa Riggs on 07/01/2020 at 8:47 AM to discuss the potential use of a new treatment for mild to moderate COVID-19 viral infection in non-hospitalized patients.  This patient is a 25 y.o. female that meets the FDA criteria for Emergency Use Authorization of COVID monoclonal antibody sotrovimab.  Has a (+) direct SARS-CoV-2 viral test result  Has mild or moderate COVID-19   Is NOT hospitalized due to COVID-19  Is within 10 days of symptom onset  Has at least one of the high risk factor(s) for progression to severe COVID-19 and/or hospitalization as defined in EUA.  Specific high risk criteria : BMI > 25 and Diabetes   I have spoken and communicated the following to the patient or parent/caregiver regarding COVID monoclonal antibody treatment:  1. FDA has authorized the emergency use for the treatment of mild to moderate COVID-19 in adults and pediatric patients with positive results of direct SARS-CoV-2 viral testing who are 22 years of age and older weighing at least 40 kg, and who are at high risk for progressing to severe COVID-19 and/or hospitalization.  2. The significant known and potential risks and benefits of COVID monoclonal antibody, and the extent to which such potential risks and benefits are unknown.  3. Information on available alternative treatments and the risks and benefits of those alternatives, including clinical trials.  4. Patients treated with COVID monoclonal antibody should continue to self-isolate and use infection control measures (e.g., wear mask, isolate, social distance, avoid sharing personal items, clean and disinfect "high touch" surfaces, and frequent handwashing) according to CDC guidelines.   5. The patient or parent/caregiver has the option to accept or refuse COVID monoclonal antibody treatment. 6. Sx onset 1/18; cost reviewed  After reviewing this information with the patient, the patient has agreed to receive  one of the available covid 19 monoclonal antibodies and will be provided an appropriate fact sheet prior to infusion. Scot Dock, NP 07/01/2020 8:47 AM

## 2020-07-01 NOTE — Discharge Instructions (Signed)

## 2020-07-02 ENCOUNTER — Encounter (INDEPENDENT_AMBULATORY_CARE_PROVIDER_SITE_OTHER): Payer: Self-pay | Admitting: Bariatrics

## 2020-07-02 NOTE — Progress Notes (Signed)
TeleHealth Visit:  Due to the COVID-19 pandemic, this visit was completed with telemedicine (audio/video) technology to reduce patient and provider exposure as well as to preserve personal protective equipment.   Melissa Riggs has verbally consented to this TeleHealth visit. The patient is located at home, the provider is located at the [facility] office. The participants in this visit include the listed provider and patient. The visit was conducted today via MyChart video.  Chief Complaint: Melissa Riggs is here to discuss her progress with her obesity treatment plan along with follow-up of her obesity related diagnoses. Melissa Riggs is on the Category 2 Plan and states she is following her eating plan approximately 50% of the time. Melissa Riggs states she is not exercising regularly at this time.  Today's visit was #: 2 Starting weight: 284 lbs Starting date: 06/17/2020  Interim History: Melissa Riggs states that her weight is the same.  She is positive for COVID-19 this week.  She has not followed as well with COVID-19.  Subjective:   1. Diabetes mellitus type 2 in obese (HCC) Poorly controlled.  FBS 180s.  A1c 11.4, insulin level 54.7.  Called her and started her on metformin XR 750 mg daily.  Lab Results  Component Value Date   HGBA1C 11.4 (H) 06/17/2020   HGBA1C 5.9 03/02/2018   Lab Results  Component Value Date   LDLCALC 138 (H) 06/17/2020   CREATININE 0.59 06/17/2020   Lab Results  Component Value Date   INSULIN 54.7 (H) 06/17/2020   2. Vitamin D deficiency Melissa Riggs's Vitamin D level was 5.6 on 06/17/2020. She is currently taking no vitamin D supplement.  Called her and started her on vitamin D 50,000 IU weekly after lab results came back.  3. Elevated liver enzymes Melissa Riggs has a diagnosis of elevated LFTs. Her BMI is over 40. She denies abdominal pain or jaundice and has never been told of any liver problems in the past. She denies excessive alcohol intake.  She has  history of fatty liver.  ALT 96, AST 39.  Lab Results  Component Value Date   ALT 96 (H) 06/17/2020   AST 39 06/17/2020   ALKPHOS 88 06/17/2020   BILITOT 0.4 06/17/2020   Assessment/Plan:   1. Diabetes mellitus type 2 in obese (HCC) Good blood sugar control is important to decrease the likelihood of diabetic complications such as nephropathy, neuropathy, limb loss, blindness, coronary artery disease, and death. Intensive lifestyle modification including diet, exercise and weight loss are the first line of treatment for diabetes.  She will continue metformin and check FBS and 2 hours after every meal.  Will start Ozempic 0.25 mg subcutaneously weekly, as per below.  -Start Semaglutide,0.25 or 0.5MG /DOS, (OZEMPIC, 0.25 OR 0.5 MG/DOSE,) 2 MG/1.5ML SOPN; Inject 0.25 mg into the skin once a week.  Dispense: 1.5 mL; Refill: 0  2. Vitamin D deficiency Low Vitamin D level contributes to fatigue and are associated with obesity, breast, and colon cancer. She agrees to continue to take prescription Vitamin D @50 ,000 IU every week and will follow-up for routine testing of Vitamin D, at least 2-3 times per year to avoid over-replacement.  3. Elevated liver enzymes We discussed the likely diagnosis of non-alcoholic fatty liver disease today and how this condition is obesity related. Melissa Riggs was educated the importance of weight loss. Melissa Riggs agreed to continue with her weight loss efforts with healthier diet and exercise as an essential part of her treatment plan.  Will follow over time and if LFT's  remain high or continue to trend upward will send to GI.  4. Class 3 severe obesity with serious comorbidity and body mass index (BMI) of 45.0 to 49.9 in adult, unspecified obesity type (HCC)  Melissa Riggs is currently in the action stage of change. As such, her goal is to continue with weight loss efforts. She has agreed to the Category 2 Plan.   She will work on meal planning.  Labs from 06/17/2020, including  CMP, lipid panel, vitamin D, B12, CBC, A1c, and thyroid panel were discussed with the patient today.  Exercise goals: For substantial health benefits, adults should do at least 150 minutes (2 hours and 30 minutes) a week of moderate-intensity, or 75 minutes (1 hour and 15 minutes) a week of vigorous-intensity aerobic physical activity, or an equivalent combination of moderate- and vigorous-intensity aerobic activity. Aerobic activity should be performed in episodes of at least 10 minutes, and preferably, it should be spread throughout the week.  Behavioral modification strategies: increasing lean protein intake, decreasing simple carbohydrates, increasing vegetables, increasing water intake, decreasing eating out, no skipping meals, meal planning and cooking strategies, keeping healthy foods in the home and planning for success.  Melissa Riggs has agreed to follow-up with our clinic in 2 weeks. She was informed of the importance of frequent follow-up visits to maximize her success with intensive lifestyle modifications for her multiple health conditions.  Objective:   VITALS: Per patient if applicable, see vitals. GENERAL: Alert and in no acute distress. CARDIOPULMONARY: No increased WOB. Speaking in clear sentences.  PSYCH: Pleasant and cooperative. Speech normal rate and rhythm. Affect is appropriate. Insight and judgement are appropriate. Attention is focused, linear, and appropriate.  NEURO: Oriented as arrived to appointment on time with no prompting.   Lab Results  Component Value Date   CREATININE 0.59 06/17/2020   BUN 8 06/17/2020   NA 136 06/17/2020   K 4.5 06/17/2020   CL 99 06/17/2020   CO2 21 06/17/2020   Lab Results  Component Value Date   ALT 96 (H) 06/17/2020   AST 39 06/17/2020   ALKPHOS 88 06/17/2020   BILITOT 0.4 06/17/2020   Lab Results  Component Value Date   HGBA1C 11.4 (H) 06/17/2020   HGBA1C 5.9 03/02/2018   Lab Results  Component Value Date   INSULIN 54.7 (H)  06/17/2020   Lab Results  Component Value Date   TSH 1.540 06/17/2020   Lab Results  Component Value Date   CHOL 231 (H) 06/17/2020   HDL 32 (L) 06/17/2020   LDLCALC 138 (H) 06/17/2020   TRIG 337 (H) 06/17/2020   Lab Results  Component Value Date   WBC 6.3 06/17/2020   HGB 15.1 06/17/2020   HCT 44.2 06/17/2020   MCV 84 06/17/2020   PLT 285 06/17/2020   Attestation Statements:   Reviewed by clinician on day of visit: allergies, medications, problem list, medical history, surgical history, family history, social history, and previous encounter notes.  I, Water quality scientist, CMA, am acting as Location manager for [employer], DO  I have reviewed the above documentation for accuracy and completeness, and I agree with the above. Jearld Lesch, DO

## 2020-07-18 ENCOUNTER — Ambulatory Visit (INDEPENDENT_AMBULATORY_CARE_PROVIDER_SITE_OTHER): Payer: BC Managed Care – PPO | Admitting: Bariatrics

## 2020-08-01 ENCOUNTER — Ambulatory Visit (INDEPENDENT_AMBULATORY_CARE_PROVIDER_SITE_OTHER): Payer: BC Managed Care – PPO | Admitting: Bariatrics

## 2020-08-01 ENCOUNTER — Encounter (INDEPENDENT_AMBULATORY_CARE_PROVIDER_SITE_OTHER): Payer: Self-pay | Admitting: Bariatrics

## 2020-08-01 ENCOUNTER — Other Ambulatory Visit: Payer: Self-pay

## 2020-08-01 ENCOUNTER — Other Ambulatory Visit (INDEPENDENT_AMBULATORY_CARE_PROVIDER_SITE_OTHER): Payer: Self-pay

## 2020-08-01 VITALS — BP 138/87 | HR 76 | Temp 98.7°F | Ht 64.0 in | Wt 280.0 lb

## 2020-08-01 DIAGNOSIS — Z6841 Body Mass Index (BMI) 40.0 and over, adult: Secondary | ICD-10-CM

## 2020-08-01 DIAGNOSIS — E669 Obesity, unspecified: Secondary | ICD-10-CM | POA: Diagnosis not present

## 2020-08-01 DIAGNOSIS — E1169 Type 2 diabetes mellitus with other specified complication: Secondary | ICD-10-CM

## 2020-08-01 DIAGNOSIS — Z9189 Other specified personal risk factors, not elsewhere classified: Secondary | ICD-10-CM | POA: Diagnosis not present

## 2020-08-01 DIAGNOSIS — E559 Vitamin D deficiency, unspecified: Secondary | ICD-10-CM | POA: Diagnosis not present

## 2020-08-01 MED ORDER — METFORMIN HCL ER 750 MG PO TB24
750.0000 mg | ORAL_TABLET | Freq: Every day | ORAL | 0 refills | Status: DC
Start: 2020-08-01 — End: 2020-08-01

## 2020-08-01 MED ORDER — VICTOZA 18 MG/3ML ~~LOC~~ SOPN: PEN_INJECTOR | SUBCUTANEOUS | 0 refills | Status: DC

## 2020-08-01 MED ORDER — INSULIN PEN NEEDLE 31G X 6 MM MISC: 0 refills | Status: DC

## 2020-08-01 MED ORDER — METFORMIN HCL ER 750 MG PO TB24: 750.0000 mg | ORAL_TABLET | Freq: Every day | ORAL | 0 refills | Status: DC

## 2020-08-05 ENCOUNTER — Encounter (INDEPENDENT_AMBULATORY_CARE_PROVIDER_SITE_OTHER): Payer: Self-pay | Admitting: Bariatrics

## 2020-08-05 NOTE — Progress Notes (Signed)
Chief Complaint:   Melissa Riggs is here to discuss her progress with her obesity treatment plan along with follow-up of her obesity related diagnoses. Melissa Riggs is on the Category 2 Plan and states she is following her eating plan approximately 25% of the time. Melissa Riggs states she is dong 0 minutes 0 times per week.  Today's visit was #: 3 Starting weight: 284 lbs Starting date: 06/17/2020 Today's weight: 280 lbs Today's date: 08/01/2020 Total lbs lost to date: 4 lbs Total lbs lost since last in-office visit: 4 lbs  Interim History: Melissa Riggs is down 4 lbs since her last visit. She has been sick with COVID-19 and had been only following the plan 25% of the time.   Subjective:   1. Diabetes mellitus type 2 in obese ([place]) [facility] fasting blood sugar 160 and 2 hours post prandial 180. She has been prescribed Ozempic but hasn't started taking it.   Lab Results  Component Value Date   HGBA1C 11.4 (H) 06/17/2020   HGBA1C 5.9 03/02/2018   Lab Results  Component Value Date   LDLCALC 138 (H) 06/17/2020   CREATININE 0.59 06/17/2020   Lab Results  Component Value Date   INSULIN 54.7 (H) 06/17/2020    2. Vitamin D deficiency Melissa Riggs's Vitamin D level was 5.6 on 06/17/2020. She is currently taking prescription vitamin D 50,000 IU each week.    Ref. Range 06/17/2020 08:21  Vitamin D, 25-Hydroxy Latest Ref Range: 30.0 - 100.0 ng/mL 5.6 (L)    3. At risk for hypoglycemia Melissa Riggs is at increased risk for hypoglycemia due to changes in medication, diet, diagnosis of diabetes, and/or insulin use.  Assessment/Plan:   1. Diabetes mellitus type 2 in obese (HCC) Good blood sugar control is important to decrease the likelihood of diabetic complications such as nephropathy, neuropathy, limb loss, blindness, coronary artery disease, and death. Intensive lifestyle modification including diet, exercise and weight loss are the first line of treatment for diabetes. Prescriptions for Metformin  750 mg daily, Victoza 18 mg 0.6 mg SubQ for 7 days, then increase to 1.2 mg SubQ once a day, and insulin pen needles sent to pharmacy.  - metFORMIN (GLUCOPHAGE XR) 750 MG 24 hr tablet; Take 1 tablet (750 mg total) by mouth daily with breakfast.  Dispense: 30 tablet; Refill: 0  2. Vitamin D deficiency Low Vitamin D level contributes to fatigue and are associated with obesity, breast, and colon cancer. She agrees to continue to take prescription Vitamin D @50 ,000 IU every week and will follow-up for routine testing of Vitamin D, at least 2-3 times per year to avoid over-replacement.  3. At risk for hypoglycemia Melissa Riggs was given approximately 15 minutes of counseling today regarding prevention of hypoglycemia. She was advised of symptoms of hypoglycemia. Melissa Riggs was instructed to avoid skipping meals, eat regular protein rich meals and schedule low calorie snacks as needed.   Repetitive spaced learning was employed today to elicit superior memory formation and behavioral change  4. Class 3 severe obesity due to excess calories with serious comorbidity and body mass index (BMI) of 45.0 to 49.9 in adult [facility]) Melissa Riggs is currently in the action stage of change. As such, her goal is to continue with weight loss efforts. She has agreed to the Category 2 Plan.   Will meal plan. Intentional eating Will be more adherent to the plan at least 80% of the time Will not skip meals  Exercise goals: As is  Behavioral modification strategies: increasing lean protein intake, decreasing  simple carbohydrates, increasing vegetables, increasing water intake, decreasing liquid calories, decreasing eating out, no skipping meals, meal planning and cooking strategies, keeping healthy foods in the home and planning for success.  Melissa Riggs has agreed to follow-up with our clinic in 2 weeks with Dawn. She was informed of the importance of frequent follow-up visits to maximize her success with intensive lifestyle  modifications for her multiple health conditions.   Objective:   Blood pressure 138/87, pulse 76, temperature 98.7 F (37.1 C), height 5\' 4"  (1.626 m), weight 280 lb (127 kg), last menstrual period 07/05/2020, SpO2 (!) 79 %. Body mass index is 48.06 kg/m.  General: Cooperative, alert, well developed, in no acute distress. HEENT: Conjunctivae and lids unremarkable. Cardiovascular: Regular rhythm.  Lungs: Normal work of breathing. Neurologic: No focal deficits.   Lab Results  Component Value Date   CREATININE 0.59 06/17/2020   BUN 8 06/17/2020   NA 136 06/17/2020   K 4.5 06/17/2020   CL 99 06/17/2020   CO2 21 06/17/2020   Lab Results  Component Value Date   ALT 96 (H) 06/17/2020   AST 39 06/17/2020   ALKPHOS 88 06/17/2020   BILITOT 0.4 06/17/2020   Lab Results  Component Value Date   HGBA1C 11.4 (H) 06/17/2020   HGBA1C 5.9 03/02/2018   Lab Results  Component Value Date   INSULIN 54.7 (H) 06/17/2020   Lab Results  Component Value Date   TSH 1.540 06/17/2020   Lab Results  Component Value Date   CHOL 231 (H) 06/17/2020   HDL 32 (L) 06/17/2020   LDLCALC 138 (H) 06/17/2020   TRIG 337 (H) 06/17/2020   Lab Results  Component Value Date   WBC 6.3 06/17/2020   HGB 15.1 06/17/2020   HCT 44.2 06/17/2020   MCV 84 06/17/2020   PLT 285 06/17/2020    Attestation Statements:   Reviewed by clinician on day of visit: allergies, medications, problem list, medical history, surgical history, family history, social history, and previous encounter notes.  Coral Ceo, am acting as Location manager for [employer], [place].  I have reviewed the above documentation for accuracy and completeness, and I agree with the above. -  mec

## 2020-08-27 ENCOUNTER — Other Ambulatory Visit: Payer: Self-pay

## 2020-08-27 ENCOUNTER — Ambulatory Visit (INDEPENDENT_AMBULATORY_CARE_PROVIDER_SITE_OTHER): Payer: BC Managed Care – PPO | Admitting: Family Medicine

## 2020-08-27 ENCOUNTER — Encounter (INDEPENDENT_AMBULATORY_CARE_PROVIDER_SITE_OTHER): Payer: Self-pay | Admitting: Family Medicine

## 2020-08-27 VITALS — BP 110/72 | HR 83 | Temp 98.2°F | Ht 64.0 in | Wt 282.0 lb

## 2020-08-27 DIAGNOSIS — Z9189 Other specified personal risk factors, not elsewhere classified: Secondary | ICD-10-CM

## 2020-08-27 DIAGNOSIS — E559 Vitamin D deficiency, unspecified: Secondary | ICD-10-CM | POA: Diagnosis not present

## 2020-08-27 DIAGNOSIS — Z6841 Body Mass Index (BMI) 40.0 and over, adult: Secondary | ICD-10-CM

## 2020-08-27 DIAGNOSIS — E1169 Type 2 diabetes mellitus with other specified complication: Secondary | ICD-10-CM

## 2020-08-27 MED ORDER — METFORMIN HCL ER 750 MG PO TB24
750.0000 mg | ORAL_TABLET | Freq: Every day | ORAL | 0 refills | Status: DC
Start: 2020-08-27 — End: 2021-01-31

## 2020-08-27 MED ORDER — VITAMIN D (ERGOCALCIFEROL) 1.25 MG (50000 UNIT) PO CAPS: 50000.0000 [IU] | ORAL_CAPSULE | ORAL | 0 refills | Status: DC

## 2020-08-29 NOTE — Progress Notes (Signed)
Chief Complaint:   Melissa Riggs is here to discuss her progress with her obesity treatment plan along with follow-up of her obesity related diagnoses. Melissa Riggs is on the Category 2 Plan and states she is following her eating plan approximately 25% of the time. Melissa Riggs states she is not exercising regularly.  Today's visit was #: 4 Starting weight: 284 lbs Starting date: 06/17/2020 Today's weight: 282 lbs Today's date: 08/27/2020 Total lbs lost to date: 2 lbs Total lbs lost since last in-office visit: +2  Interim History: Melissa Riggs says she has attended a lot of family events and disregarded her diet.  For breakfast she is having Category 2 Plan breakfast or protein shake.  Lunch is mostly on plan.  Dinner is the biggest struggle and she has not been planning dinner which results in eating out sometimes.  Subjective:   1. Diabetes mellitus type 2 in obese [facility]) Newly diagnosed.  A1c 11.4.  Started on Victoza and metformin.  She is on 1.2 mg Victoza and says it is helpful for her appetite.  She endorses constipation.  Has checked CBG sporadically (128-200s).  Lab Results  Component Value Date   HGBA1C 11.4 (H) 06/17/2020   HGBA1C 5.9 03/02/2018   Lab Results  Component Value Date   LDLCALC 138 (H) 06/17/2020   CREATININE 0.59 06/17/2020   Lab Results  Component Value Date   INSULIN 54.7 (H) 06/17/2020   2. Vitamin D deficiency Vitamin D very low at 5.6.  On prescription vitamin D.  3. At risk for hyperglycemia Nithya is at risk for hyperglycemia due to being a newly diagnosed diabetic.  Assessment/Plan:   1. Diabetes mellitus type 2 in obese (HCC) Will refill metformin 750 mg, as per below.  Continue Victoza 1.2 mg subcutaneously daily.  - Refill metFORMIN (GLUCOPHAGE XR) 750 MG 24 hr tablet; Take 1 tablet (750 mg total) by mouth daily with breakfast.  Dispense: 30 tablet; Refill: 0  2. Vitamin D deficiency Refill vitamin D 50,000 IU weekly.  - Refill Vitamin D,  Ergocalciferol, (DRISDOL) 1.25 MG (50000 UNIT) CAPS capsule; Take 1 capsule (50,000 Units total) by mouth every 7 (seven) days.  Dispense: 4 capsule; Refill: 0  3. At risk for hyperglycemia Melissa Riggs was given approximately 15 minutes of counseling today regarding prevention of hyperglycemia. She was advised of hyperglycemia causes and the fact hyperglycemia is often asymptomatic. Melissa Riggs was instructed to avoid skipping meals, eat regular protein rich meals and schedule low calorie but protein rich snacks as needed.   Repetitive spaced learning was employed today to elicit superior memory formation and behavioral change  4. Class 3 severe obesity with serious comorbidity and body mass index (BMI) of 45.0 to 49.9 in adult, unspecified obesity type (HCC)  Melissa Riggs is currently in the action stage of change. As such, her goal is to continue with weight loss efforts. She has agreed to the Category 2 Plan and keeping a food journal and adhering to recommended goals of 400-500 calories and 35+ grams of protein.   Exercise goals: All adults should avoid inactivity. Some physical activity is better than none, and adults who participate in any amount of physical activity gain some health benefits.  Behavioral modification strategies: decreasing simple carbohydrates and meal planning and cooking strategies.  Handout provided:  Eating Out, Lunch Options, Smart Fruit.   Melissa Riggs has agreed to follow-up with our clinic in 2 weeks  Objective:   Blood pressure 110/72, pulse 83, temperature 98.2 F (36.8 C), height  5\' 4"  (1.626 m), weight 282 lb (127.9 kg), SpO2 95 %. Body mass index is 48.41 kg/m.  General: Cooperative, alert, well developed, in no acute distress. HEENT: Conjunctivae and lids unremarkable. Cardiovascular: Regular rhythm.  Lungs: Normal work of breathing. Neurologic: No focal deficits.   Lab Results  Component Value Date   CREATININE 0.59 06/17/2020   BUN 8 06/17/2020   NA 136  06/17/2020   K 4.5 06/17/2020   CL 99 06/17/2020   CO2 21 06/17/2020   Lab Results  Component Value Date   ALT 96 (H) 06/17/2020   AST 39 06/17/2020   ALKPHOS 88 06/17/2020   BILITOT 0.4 06/17/2020   Lab Results  Component Value Date   HGBA1C 11.4 (H) 06/17/2020   HGBA1C 5.9 03/02/2018   Lab Results  Component Value Date   INSULIN 54.7 (H) 06/17/2020   Lab Results  Component Value Date   TSH 1.540 06/17/2020   Lab Results  Component Value Date   CHOL 231 (H) 06/17/2020   HDL 32 (L) 06/17/2020   LDLCALC 138 (H) 06/17/2020   TRIG 337 (H) 06/17/2020   Lab Results  Component Value Date   WBC 6.3 06/17/2020   HGB 15.1 06/17/2020   HCT 44.2 06/17/2020   MCV 84 06/17/2020   PLT 285 06/17/2020   Attestation Statements:   Reviewed by clinician on day of visit: allergies, medications, problem list, medical history, surgical history, family history, social history, and previous encounter notes.  I, Water quality scientist, CMA, am acting as Location manager for [employer], [place].  I have reviewed the above documentation for accuracy and completeness, and I agree with the above. -  Georgianne Fick, FNP

## 2020-08-30 ENCOUNTER — Encounter (INDEPENDENT_AMBULATORY_CARE_PROVIDER_SITE_OTHER): Payer: Self-pay | Admitting: Family Medicine

## 2020-08-30 DIAGNOSIS — E119 Type 2 diabetes mellitus without complications: Secondary | ICD-10-CM | POA: Insufficient documentation

## 2020-08-30 DIAGNOSIS — Z6841 Body Mass Index (BMI) 40.0 and over, adult: Secondary | ICD-10-CM | POA: Insufficient documentation

## 2020-08-30 DIAGNOSIS — E559 Vitamin D deficiency, unspecified: Secondary | ICD-10-CM | POA: Insufficient documentation

## 2020-09-09 ENCOUNTER — Other Ambulatory Visit: Payer: Self-pay

## 2020-09-09 ENCOUNTER — Encounter (INDEPENDENT_AMBULATORY_CARE_PROVIDER_SITE_OTHER): Payer: Self-pay | Admitting: Bariatrics

## 2020-09-09 ENCOUNTER — Ambulatory Visit (INDEPENDENT_AMBULATORY_CARE_PROVIDER_SITE_OTHER): Payer: BC Managed Care – PPO | Admitting: Bariatrics

## 2020-09-09 VITALS — BP 126/87 | HR 82 | Temp 98.3°F | Ht 64.0 in | Wt 285.0 lb

## 2020-09-09 DIAGNOSIS — E1169 Type 2 diabetes mellitus with other specified complication: Secondary | ICD-10-CM | POA: Diagnosis not present

## 2020-09-09 DIAGNOSIS — E669 Obesity, unspecified: Secondary | ICD-10-CM | POA: Diagnosis not present

## 2020-09-09 DIAGNOSIS — E559 Vitamin D deficiency, unspecified: Secondary | ICD-10-CM

## 2020-09-09 DIAGNOSIS — Z9189 Other specified personal risk factors, not elsewhere classified: Secondary | ICD-10-CM | POA: Diagnosis not present

## 2020-09-09 DIAGNOSIS — Z6841 Body Mass Index (BMI) 40.0 and over, adult: Secondary | ICD-10-CM

## 2020-09-09 MED ORDER — VICTOZA 18 MG/3ML ~~LOC~~ SOPN: PEN_INJECTOR | SUBCUTANEOUS | 0 refills | Status: DC

## 2020-09-09 MED ORDER — VICTOZA 18 MG/3ML ~~LOC~~ SOPN
PEN_INJECTOR | SUBCUTANEOUS | 0 refills | Status: DC
Start: 2020-09-09 — End: 2021-01-31

## 2020-09-16 ENCOUNTER — Encounter (INDEPENDENT_AMBULATORY_CARE_PROVIDER_SITE_OTHER): Payer: Self-pay | Admitting: Bariatrics

## 2020-09-16 NOTE — Progress Notes (Signed)
Chief Complaint:   Melissa Riggs is here to discuss her progress with her obesity treatment plan along with follow-up of her obesity related diagnoses. Melissa Riggs is on the Category 2 Plan and states she is following her eating plan approximately 25% of the time. Melissa Riggs states she is not exercising regularly.  Today's visit was #: 5 Starting weight: 284 lbs Starting date: 06/17/2020 Today's weight: 285 lbs Today's date: 09/09/2020 Total lbs lost to date: 0 Total lbs lost since last in-office visit: 0  Interim History: Melissa Riggs is up 3 pounds since her last visit.  She is not skipping meals.  Subjective:   1. Diabetes mellitus type 2 in obese [facility]) She is taking metformin and Victoza.  She endorses constipation and is taking MiraLAX.  Lab Results  Component Value Date   HGBA1C 11.4 (H) 06/17/2020   HGBA1C 5.9 03/02/2018   Lab Results  Component Value Date   LDLCALC 138 (H) 06/17/2020   CREATININE 0.59 06/17/2020   Lab Results  Component Value Date   INSULIN 54.7 (H) 06/17/2020   2. Vitamin D deficiency Melissa Riggs's Vitamin D level was 5.6 on 06/17/2020. She is currently taking prescription vitamin D 50,000 IU each week. She denies nausea, vomiting or muscle weakness.  3. At risk for osteoporosis Melissa Riggs is at higher risk of osteopenia and osteoporosis due to Vitamin D deficiency.   Assessment/Plan:   1. Diabetes mellitus type 2 in obese (HCC) Good blood sugar control is important to decrease the likelihood of diabetic complications such as nephropathy, neuropathy, limb loss, blindness, coronary artery disease, and death. Intensive lifestyle modification including diet, exercise and weight loss are the first line of treatment for diabetes.  Will refill Victoza today, as per below.  - Refill liraglutide (VICTOZA) 18 MG/3ML SOPN; 1.8 mg daily into skin daily  Dispense: 15 mL; Refill: 0  2. Vitamin D deficiency Low Vitamin D level contributes to fatigue and are associated  with obesity, breast, and colon cancer. She agrees to continue to take prescription Vitamin D @50 ,000 IU every week and will follow-up for routine testing of Vitamin D, at least 2-3 times per year to avoid over-replacement.  3. At risk for osteoporosis Melissa Riggs was given approximately 15 minutes of osteoporosis prevention counseling today. Melissa Riggs is at risk for osteopenia and osteoporosis due to her Vitamin D deficiency. She was encouraged to take her Vitamin D and follow her higher calcium diet and increase strengthening exercise to help strengthen her bones and decrease her risk of osteopenia and osteoporosis.  Repetitive spaced learning was employed today to elicit superior memory formation and behavioral change.  4. Class 3 severe obesity with serious comorbidity and body mass index (BMI) of 45.0 to 49.9 in adult, unspecified obesity type (HCC)  Melissa Riggs is currently in the action stage of change. As such, her goal is to continue with weight loss efforts. She has agreed to the Category 2 Plan.   She will work on meal planning, intentional eating, will adhere strictly to the plan, and will make better choices.  Exercise goals: For substantial health benefits, adults should do at least 150 minutes (2 hours and 30 minutes) a week of moderate-intensity, or 75 minutes (1 hour and 15 minutes) a week of vigorous-intensity aerobic physical activity, or an equivalent combination of moderate- and vigorous-intensity aerobic activity. Aerobic activity should be performed in episodes of at least 10 minutes, and preferably, it should be spread throughout the week.  Behavioral modification strategies: increasing lean protein intake,  decreasing simple carbohydrates, increasing vegetables, increasing water intake, decreasing eating out, no skipping meals, meal planning and cooking strategies, keeping healthy foods in the home and planning for success.  Melissa Riggs has agreed to follow-up with our clinic in 2-3 weeks,  fasting. She was informed of the importance of frequent follow-up visits to maximize her success with intensive lifestyle modifications for her multiple health conditions.   Objective:   Blood pressure 126/87, pulse 82, temperature 98.3 F (36.8 C), height 5\' 4"  (1.626 m), weight 285 lb (129.3 kg), SpO2 98 %. Body mass index is 48.92 kg/m.  General: Cooperative, alert, well developed, in no acute distress. HEENT: Conjunctivae and lids unremarkable. Cardiovascular: Regular rhythm.  Lungs: Normal work of breathing. Neurologic: No focal deficits.   Lab Results  Component Value Date   CREATININE 0.59 06/17/2020   BUN 8 06/17/2020   NA 136 06/17/2020   K 4.5 06/17/2020   CL 99 06/17/2020   CO2 21 06/17/2020   Lab Results  Component Value Date   ALT 96 (H) 06/17/2020   AST 39 06/17/2020   ALKPHOS 88 06/17/2020   BILITOT 0.4 06/17/2020   Lab Results  Component Value Date   HGBA1C 11.4 (H) 06/17/2020   HGBA1C 5.9 03/02/2018   Lab Results  Component Value Date   INSULIN 54.7 (H) 06/17/2020   Lab Results  Component Value Date   TSH 1.540 06/17/2020   Lab Results  Component Value Date   CHOL 231 (H) 06/17/2020   HDL 32 (L) 06/17/2020   LDLCALC 138 (H) 06/17/2020   TRIG 337 (H) 06/17/2020   Lab Results  Component Value Date   WBC 6.3 06/17/2020   HGB 15.1 06/17/2020   HCT 44.2 06/17/2020   MCV 84 06/17/2020   PLT 285 06/17/2020   Attestation Statements:   Reviewed by clinician on day of visit: allergies, medications, problem list, medical history, surgical history, family history, social history, and previous encounter notes.  I, Water quality scientist, CMA, am acting as Location manager for [employer], DO  I have reviewed the above documentation for accuracy and completeness, and I agree with the above. Jearld Lesch, DO

## 2020-09-23 ENCOUNTER — Ambulatory Visit (INDEPENDENT_AMBULATORY_CARE_PROVIDER_SITE_OTHER): Payer: BC Managed Care – PPO | Admitting: Family Medicine

## 2020-10-07 ENCOUNTER — Ambulatory Visit (INDEPENDENT_AMBULATORY_CARE_PROVIDER_SITE_OTHER): Payer: BC Managed Care – PPO | Admitting: Bariatrics

## 2020-10-09 IMAGING — MR MR HEAD WO/W CM
19 series · 46 of 48 positions shown · IV contrast (multihance)
Comparison: None.

CLINICAL DATA: Amenorrhea. History of polycystic ovarian syndrome.
Assess for pituitary tumor.

EXAM:
MRI HEAD WITHOUT AND WITH CONTRAST
TECHNIQUE: Multiplanar, multiecho pulse sequences of the brain and surrounding
structures were obtained without and with intravenous contrast.
CONTRAST:  10mL MULTIHANCE GADOBENATE DIMEGLUMINE 529 MG/ML IV SOLN

[Series 5: T1 · sagittal · 4.0mm · 0.72mm/px · 2 of 30 slices shown (1 of 5)]
[im 1/30]
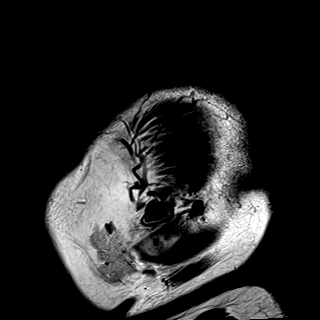
[im 30/30]
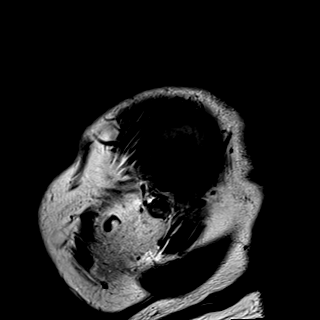

[Series 6: DWI · axial · 3.0mm · 1.44mm/px · z∈[-43,+109]mm · 6 of 84 slices shown (1 of 2)]
[im 1/84]
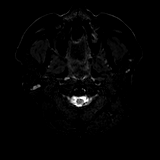
[im 17/84]
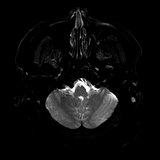
[im 34/84]
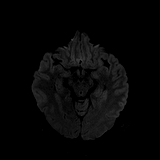
[im 50/84]
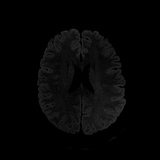
[im 67/84]
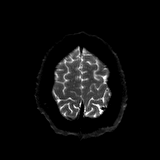
[im 84/84]
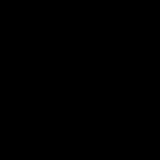

[Series 7: DWI · axial · 3.0mm · 1.44mm/px · z∈[-43,+109]mm · 3 of 42 slices shown (2 of 2)]
[im 1/42]
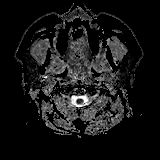
[im 21/42]
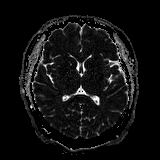
[im 42/42]
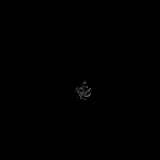

[Series 8: T2 · axial · 4.0mm · 0.36mm/px · z∈[-36,+102]mm · 3 of 29 slices shown]
[im 1/29]
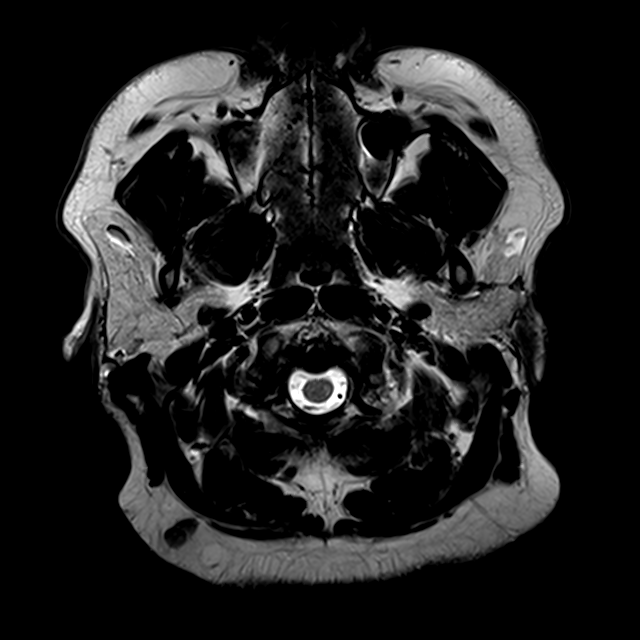
[im 15/29]
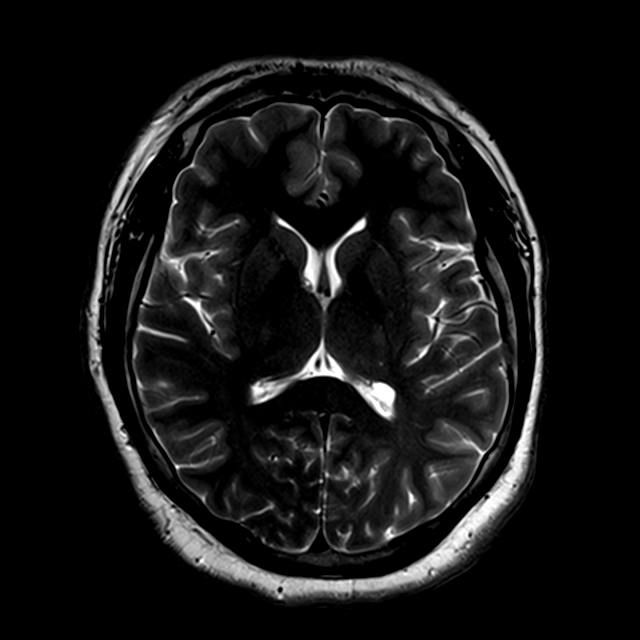
[im 29/29]
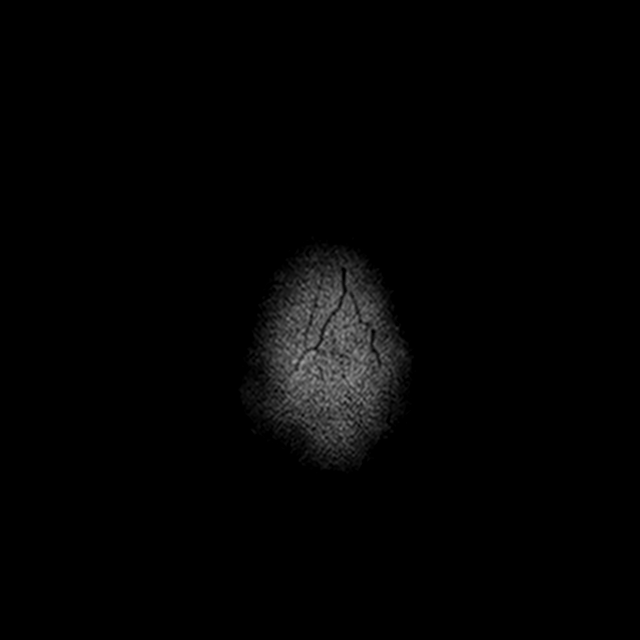

[Series 9: GRE · axial · 4.0mm · 0.69mm/px · z∈[-38,+100]mm · 3 of 29 slices shown]
[im 1/29]
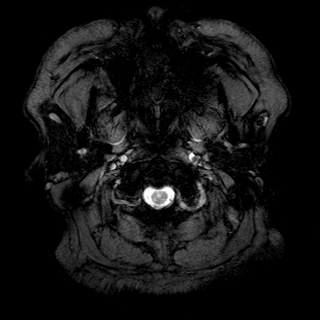
[im 15/29]
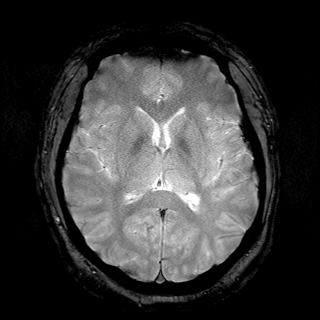
[im 29/29]
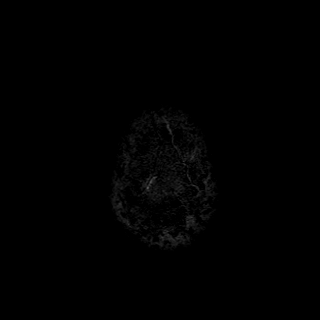

[Series 10: FLAIR · axial · 3.0mm · 0.72mm/px · z∈[-33,+98]mm · 2 of 24 slices shown]
[im 1/24]
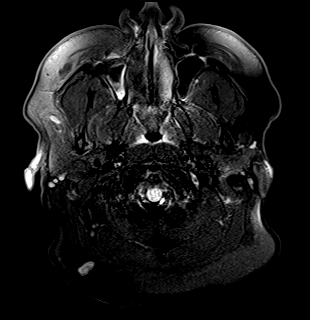
[im 24/24]
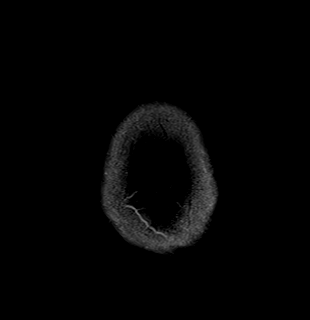

[Series 11: T1 · sagittal · 3.0mm · 0.42mm/px · 1 of 13 slices shown (2 of 5)]
[im 1/13]
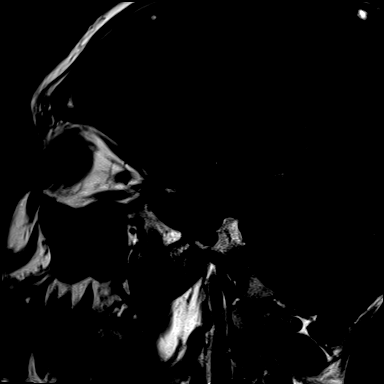

[Series 12: T1 · coronal · 3.0mm · 0.42mm/px · 1 of 10 slices shown (3 of 5)]
[im 1/10]
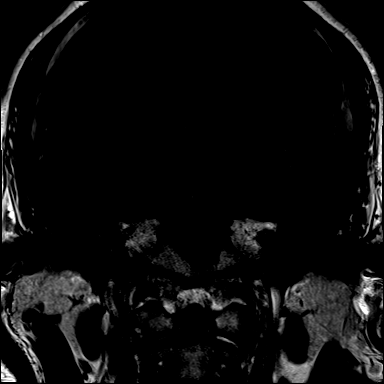

[Series 13: T1 · coronal · non-contrast · 3.0mm · 0.62mm/px · 1 of 9 slices shown (4 of 5)]
[im 1/9]
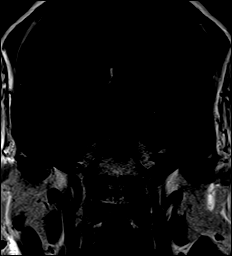

[Series 14: cor post dyn · coronal · 3.0mm · 0.62mm/px · 1 of 9 slices shown (1 of 6)]
[im 1/9]
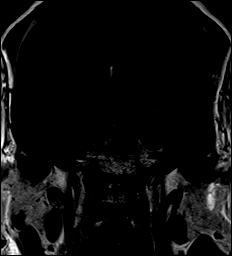

[Series 15: cor post dyn · coronal · 3.0mm · 0.62mm/px · 1 of 9 slices shown (2 of 6)]
[im 1/9]
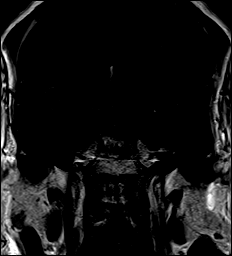

[Series 16: cor post dyn · coronal · 3.0mm · 0.62mm/px · 1 of 9 slices shown (3 of 6)]
[im 1/9]
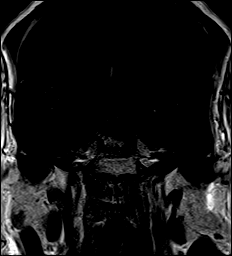

[Series 17: cor post dyn · coronal · 3.0mm · 0.62mm/px · 1 of 9 slices shown (4 of 6)]
[im 1/9]
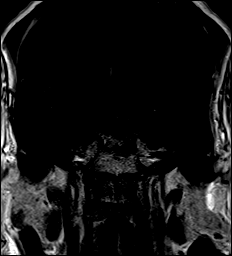

[Series 18: cor post dyn · coronal · 3.0mm · 0.62mm/px · 1 of 9 slices shown (5 of 6)]
[im 1/9]
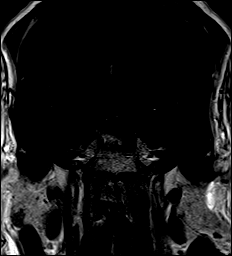

[Series 19: cor post dyn · coronal · 3.0mm · 0.62mm/px · 1 of 9 slices shown (6 of 6)]
[im 1/9]
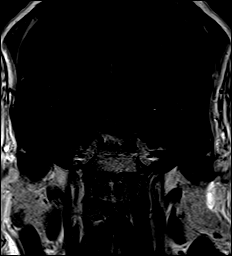

[Series 20: T1 post-contrast · sagittal · 3.0mm · 0.42mm/px · 1 of 13 slices shown (1 of 3)]
[im 1/13]
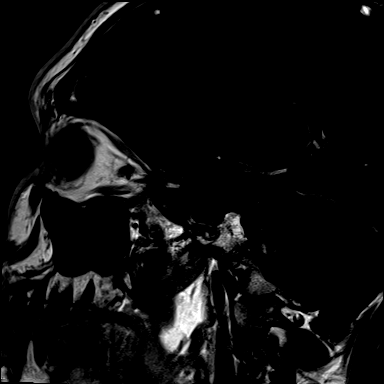

[Series 21: T1 post-contrast · coronal · 3.0mm · 0.42mm/px · 1 of 10 slices shown (2 of 3)]
[im 1/10]
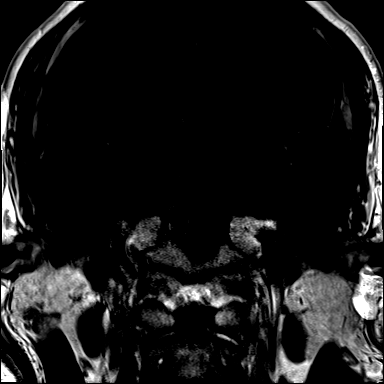

[Series 22: T1 · axial · 1.0mm · 0.86mm/px · z∈[-52,+114]mm · 13 of 176 slices shown (5 of 5)]
[im 1/176]
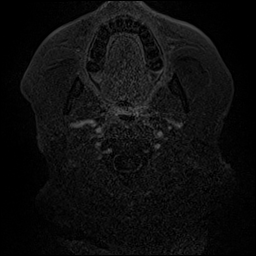
[im 13/176]
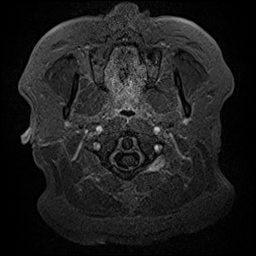
[im 26/176]
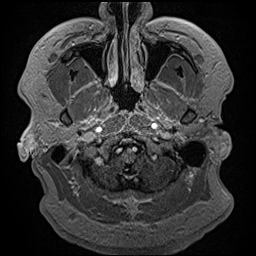
[im 38/176]
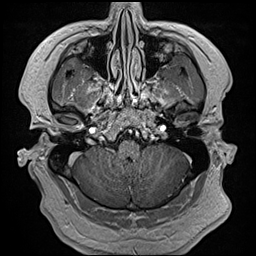
[im 51/176]
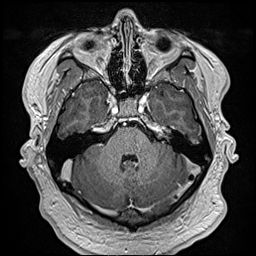
[im 63/176]
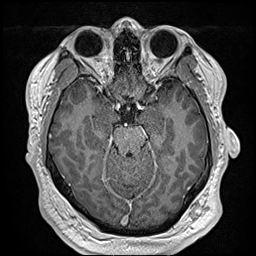
[im 76/176]
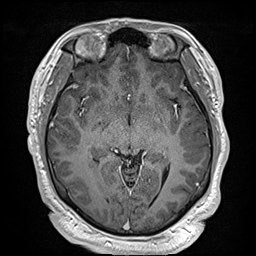
[im 88/176]
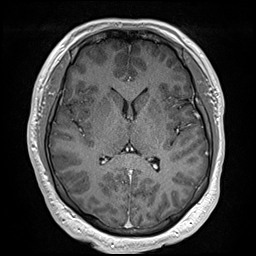
[im 101/176]
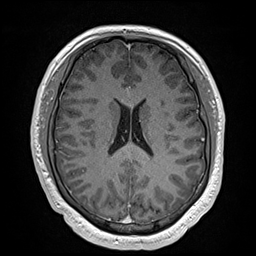
[im 113/176]
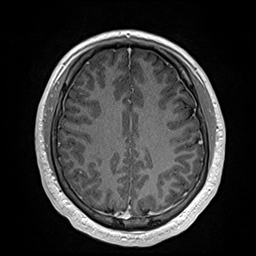
[im 126/176]
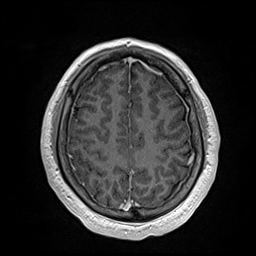
[im 151/176]
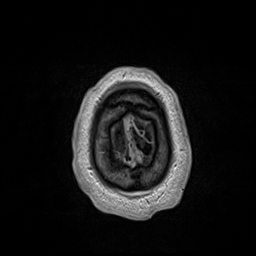
[im 176/176]
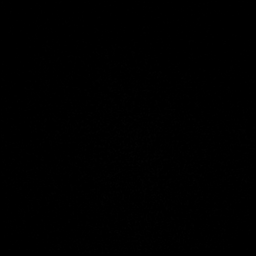

[Series 23: T1 post-contrast · coronal · 4.0mm · 0.47mm/px · 3 of 32 slices shown (3 of 3)]
[im 1/32]
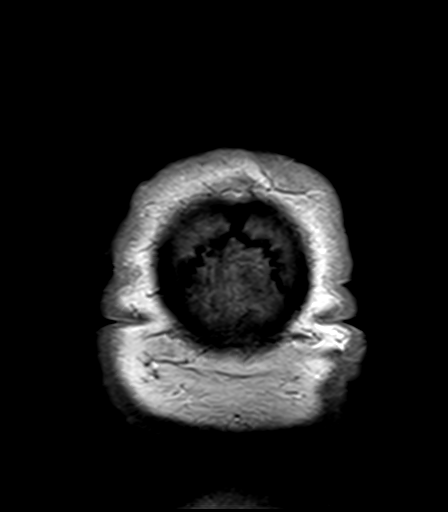
[im 16/32]
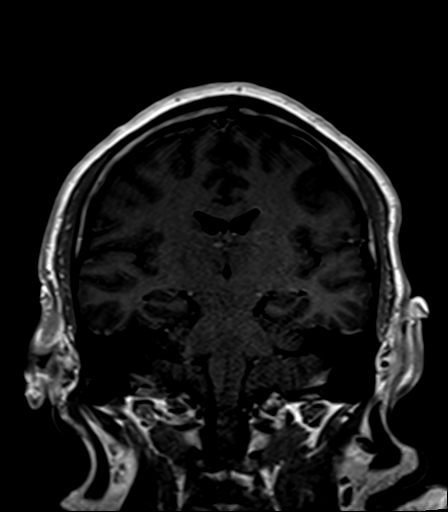
[im 32/32]
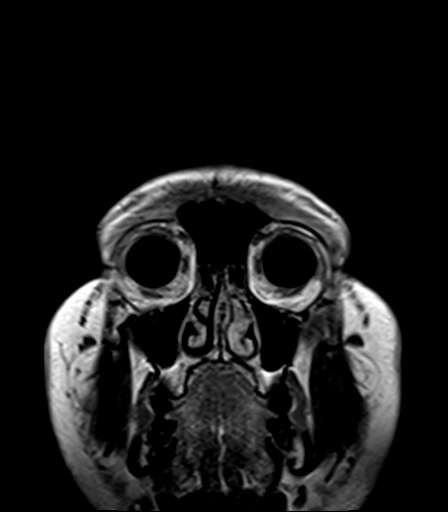

[46 of 48 positions shown; findings below may reference images not displayed]

FINDINGS: SELLA: No abnormal sellar expansion. Normal posterior pituitary
bright spot. No early foci of suspicious hypo enhancement. Pituitary
stalk is midline with normal enhancement. Symmetric appearance of
the cavernous sinuses and cavernous sinus flow voids. Normal
appearance of the overlying optic chiasm.

INTRACRANIAL CONTENTS: No reduced diffusion to suggest acute
ischemia, hyperacute demyelination or hypercellular tumor. No
parenchymal brain volume loss for age. No hydrocephalus. No
suspicious parenchymal signal, mass lesions, mass effect. No
abnormal intraparenchymal or extra-axial enhancement. No abnormal
extra-axial fluid collections.

VASCULAR: Normal major intracranial vascular flow voids present at
skull base.

ORBITS: The ocular globes and orbital contents are non-suspicious.
Status post bilateral ocular lens implants.

SINUSES: Trace paranasal sinus mucosal thickening. Mastoid air cells
are well aerated.

SKULL/SOFT TISSUES: No suspicious calvarial bone marrow signal.
Craniocervical junction maintained.
IMPRESSION: Normal MRI of the brain and sella with and without contrast.

## 2020-10-17 ENCOUNTER — Ambulatory Visit: Payer: Self-pay | Admitting: Obstetrics and Gynecology

## 2021-01-30 ENCOUNTER — Telehealth: Payer: Self-pay | Admitting: *Deleted

## 2021-01-30 ENCOUNTER — Ambulatory Visit: Payer: Self-pay | Admitting: Obstetrics and Gynecology

## 2021-01-30 NOTE — Telephone Encounter (Signed)
Patient called c/o itching and irritation since Monday, reports when she looks inside her vaginal she can see brown discharge and white discharge. The brown/white discharge does not come out with urination. She is diabetic reports she was in [place] in May and was taking her medication daily, history of yeast infections. She is aware not appointments to day, offered office visit on Monday, however patient asked if something can be given to help relieve symptoms? Has tried OTC Monistat in past and does not work for her. Please advise

## 2021-01-30 NOTE — Telephone Encounter (Signed)
Spoke with Dr.Jertson and patient is coming tomorrow at 4:15pm for arrival time at 4:00pm.

## 2021-01-31 ENCOUNTER — Encounter: Payer: Self-pay | Admitting: Obstetrics and Gynecology

## 2021-01-31 ENCOUNTER — Ambulatory Visit (INDEPENDENT_AMBULATORY_CARE_PROVIDER_SITE_OTHER): Payer: BC Managed Care – PPO | Admitting: Obstetrics and Gynecology

## 2021-01-31 ENCOUNTER — Other Ambulatory Visit: Payer: Self-pay

## 2021-01-31 VITALS — BP 122/82 | HR 77 | Ht 63.0 in | Wt 276.0 lb

## 2021-01-31 DIAGNOSIS — Z30431 Encounter for routine checking of intrauterine contraceptive device: Secondary | ICD-10-CM | POA: Diagnosis not present

## 2021-01-31 DIAGNOSIS — Z8542 Personal history of malignant neoplasm of other parts of uterus: Secondary | ICD-10-CM

## 2021-01-31 DIAGNOSIS — N760 Acute vaginitis: Secondary | ICD-10-CM

## 2021-01-31 DIAGNOSIS — B373 Candidiasis of vulva and vagina: Secondary | ICD-10-CM

## 2021-01-31 DIAGNOSIS — B3731 Acute candidiasis of vulva and vagina: Secondary | ICD-10-CM

## 2021-01-31 LAB — WET PREP FOR TRICH, YEAST, CLUE

## 2021-01-31 MED ORDER — FLUCONAZOLE 150 MG PO TABS: 150.0000 mg | ORAL_TABLET | Freq: Once | ORAL | 0 refills | Status: AC

## 2021-01-31 MED ORDER — BETAMETHASONE VALERATE 0.1 % EX OINT: 1.0000 "application " | TOPICAL_OINTMENT | Freq: Two times a day (BID) | CUTANEOUS | 0 refills | Status: DC

## 2021-01-31 NOTE — Progress Notes (Signed)
GYNECOLOGY  VISIT   HPI: 25 y.o.   Married   Other or two or more races Unavailable Hispanic or Latino  female   [id number] with No LMP recorded.   here for possible vaginal infection Patient having brown white discharge. Has had itching since Monday. Patient has been on of her diabetic medication for a few months.   She went to [place] for a couple of months and was without her diabetic medication. She has a thick brown, white   She has a h/o endometrial adenocarcinoma. Has a mirena IUD that was inserted in 3/20. She has been getting monthly cycles with the IUD for at least 8 months. Last endometrial biopsy was in 10/21 and showed inactive endometrium, without atypia, hyperplasia.   GYNECOLOGIC HISTORY: No LMP recorded. Contraception:IUD  Menopausal hormone therapy: na         OB History     Gravida  0   Para  0   Term  0   Preterm  0   AB  0   Living  0      SAB  0   IAB  0   Ectopic  0   Multiple  0   Live Births  0              Patient Active Problem List   Diagnosis Date Noted   Diabetes mellitus ([place]) 08/30/2020   Vitamin D deficiency 08/30/2020   Class 3 severe obesity with serious comorbidity and body mass index (BMI) of 45.0 to 49.9 in adult ([place]) 08/30/2020   Morbid obesity ([place]) 06/20/2020   Diabetes mellitus type 2 in obese ([place]) 06/18/2020   Viral URI 12/23/2018   Exposure to COVID-19 virus 11/18/2018   Suspected COVID-19 virus infection 11/18/2018   Hypothyroidism 03/02/2018   Anovulation 03/02/2018    Past Medical History:  Diagnosis Date   Amenorrhea    Asthma    Asthma    when younger, not current problem   Cancer ([facility])    Complication of anesthesia    slow to awaken after tonsils and adenoid surgery yrs ago   Dysmenorrhea    Female infertility    Hormone disorder    PCOS (polycystic ovarian syndrome)    Prediabetes    Vision abnormalities    wears glasses    Past Surgical History:  Procedure Laterality Date    ADENOIDECTOMY     APPENDECTOMY     CARPAL TUNNEL RELEASE  12/14/2011   Procedure: CARPAL TUNNEL RELEASE;  Surgeon: Tennis Must, MD;  Location: [facility];  Service: Orthopedics;  Laterality: Left;   CARPAL TUNNEL RELEASE  01/28/2012   Procedure: CARPAL TUNNEL RELEASE;  Surgeon: Tennis Must, MD;  Location: [facility];  Service: Orthopedics;  Laterality: Right;  right carpal tunnel release   CARPAL TUNNEL RELEASE Bilateral    CATARACT EXTRACTION, BILATERAL     DILATATION & CURETTAGE/HYSTEROSCOPY WITH MYOSURE N/A 08/15/2018   Procedure: DILATATION & CURETTAGE/HYSTEROSCOPY WITH MYOSURE;  Surgeon: [name], MD;  Location: [facility];  Service: Gynecology;  Laterality: N/A;  endometrial polyp   DILATION AND CURETTAGE OF UTERUS  for miscarriage this year   TONSILLECTOMY     TONSILLECTOMY     and adenoid     Current Outpatient Medications  Medication Sig Dispense Refill   blood glucose meter kit and supplies KIT Use too Check FBS a.m. and 1 hour after evening meal 1 each 0   fluconazole (  DIFLUCAN) 150 MG tablet Take one tablet q 72 hours x 3 doses then one tablet weekly x 6 months 15 tablet 1   glucose blood test strip Use as instructed 100 each 12   Insulin Pen Needle 31G X 6 MM MISC Use with Victoza daily 50 each 0   Lancets 30G MISC Use to check blood sugar bid 100 each 0   levonorgestrel (MIRENA) 20 MCG/24HR IUD 1 Intra Uterine Device (1 each total) by Intrauterine route once for 1 dose. To treat endometrial cancer, fertility sparing 1 each 0   liraglutide (VICTOZA) 18 MG/3ML SOPN 1.8 mg daily into skin daily 15 mL 0   metFORMIN (GLUCOPHAGE XR) 750 MG 24 hr tablet Take 1 tablet (750 mg total) by mouth daily with breakfast. 30 tablet 0   Vitamin D, Ergocalciferol, (DRISDOL) 1.25 MG (50000 UNIT) CAPS capsule Take 1 capsule (50,000 Units total) by mouth every 7 (seven) days. 4 capsule 0   No current facility-administered medications for  this visit.     ALLERGIES: Betadine [povidone iodine], Amoxicillin, Amoxicillin, and Penicillins  Family History  Problem Relation Age of Onset   Depression Brother    Drug abuse Brother    Heart disease Maternal Grandmother    Hypertension Maternal Grandmother    Hypertension Paternal Grandmother    Depression Paternal Grandmother    Diabetes Mother    Obesity Mother    High Cholesterol Father    Alcohol abuse Father     Social History   Socioeconomic History   Marital status: Married    Spouse name: Cory Roughen   Number of children: Not on file   Years of education: Not on file   Highest education level: Not on file  Occupational History   Occupation: Passenger transport manager  Tobacco Use   Smoking status: Never   Smokeless tobacco: Never  Vaping Use   Vaping Use: Never used  Substance and Sexual Activity   Alcohol use: No   Drug use: No   Sexual activity: Yes    Birth control/protection: I.U.D.    Comment: had miscarriage may 2013  Other Topics Concern   Not on file  Social History Narrative   ** Merged History Encounter **       Social Determinants of Health   Financial Resource Strain: Not on file  Food Insecurity: Not on file  Transportation Needs: Not on file  Physical Activity: Not on file  Stress: Not on file  Social Connections: Not on file  Intimate Partner Violence: Not on file    Review of Systems  All other systems reviewed and are negative.  PHYSICAL EXAMINATION:    There were no vitals taken for this visit.    General appearance: alert, cooperative and appears stated age  Pelvic: External genitalia:  no lesions, + erythema              Urethra:  normal appearing urethra with no masses, tenderness or lesions              Bartholins and Skenes: normal                 Vagina: erythematous appearing vagina with a               Cervix: no lesions and IUD string 4 cm               Chaperone was present for exam.  1. Acute vaginitis - WET PREP FOR  TRICH, YEAST, CLUE - betamethasone  valerate ointment (VALISONE) 0.1 %; Apply 1 application topically 2 (two) times daily.  Dispense: 30 g; Refill: 0  2. Yeast vaginitis Off of her DM medication - fluconazole (DIFLUCAN) 150 MG tablet; Take 1 tablet (150 mg total) by mouth once for 1 dose. Take one tablet.  Repeat in 72 hours if symptoms are not completely resolved.  Dispense: 2 tablet; Refill: 0  3. History of endometrial cancer Has a mirena IUD for protection. Started cycling about 8 months ago -I've sent a message to Dr Denman George to see what f/u she would recommend.  4. IUD check up

## 2021-02-04 ENCOUNTER — Telehealth: Payer: Self-pay | Admitting: Obstetrics and Gynecology

## 2021-02-04 NOTE — Telephone Encounter (Signed)
Please reach out to the patient and see if she has calendared her cycles, if so can she send them for review.  She had an inactive endometrial biopsy in 10/21. This was done for a few episodes of bleeding after prolonged amenorrhea. Dr Denman George recommends resampling if the patient develops new abnormal bleeding. I think we need to evaluate what her cycles are doing. If she hasn't been calendaring her cycles, please have her do this for 3 months then f/u.

## 2021-02-04 NOTE — Telephone Encounter (Signed)
-----   Message from Everitt Amber, MD sent at 01/31/2021  6:00 PM EDT ----- Warden Fillers, I typically only resample if they have new abnormal bleeding after they have demonstrated an inactive endometrium without malignancy or hyperplasia.  If she is desiring removal of the IUD to attempt childbearing, I would consider an endometrial [place] now.   - If this continues to be benign, you could remove the IUD and she could attempt fertility (though she may need reproductive assistance from ART due to coexisting risk factors such as anovulatory cycles).   - If this biopsy shows hyperplasia, I would continue the Mirena IUD.   - If this biopsy shows malignancy, she should return to [place] for counseling. Hope this helps. Everitt Amber ----- Message ----- From: [name], MD Sent: 01/31/2021   4:46 PM EDT To: Everitt Amber, MD  Margrett Rud previously seen this patient for endometrial adenocarcinoma.  She is desiring children and a mirena IUD was inserted in 3/20 for treatment. Initially she wasn't cycling, but she has been getting monthly cycles with the IUD for at least 8 months. Her last endometrial biopsy was in 10/21 and showed inactive endometrium, without atypia, hyperplasia.  Can you please give me recommendations for when you would repeat the biopsy? Should I send her back to see you? Thanks, Sharee Pimple

## 2021-02-05 NOTE — Telephone Encounter (Signed)
I think because her bleeding is irregular, the safest thing to do is repeat her endometrial biopsy. I'm happy to send her to Dr Denman George if she desires.

## 2021-02-05 NOTE — Telephone Encounter (Signed)
Spoke with patient and she reports her cycles are normally 3 days max, if cycle last 4 days it's normally brown spotting only. She told me she had a cycle in Jan and [place], May, Jun. Normal flow, changing pads about every 6 hours. No cycle in July or August (reports brown discharge around 01/30/21 only no flow)

## 2021-02-05 NOTE — Telephone Encounter (Signed)
Patient said her insurance her has not started yet, and she would prefer to wait until her insurance starts. Patient said she would prefer to have biopsy done by you. She also mentioned that the last endometrial biopsy was extremely painful and she would like to have something for the pain prior to biopsy if possible? She also asked how soon should she have this? I explained to patient sooner than later, and that I understand her wanting to file with insurance. Please advise

## 2021-02-06 NOTE — Telephone Encounter (Signed)
I would try to do the biopsy in the next 2 months.  I can pre treat her with ibuprofen and percocet if she wants. She would need a driver and need to do either verbal consent over the phone or come and sign the consent (can't do on percocet).

## 2021-02-06 NOTE — Telephone Encounter (Signed)
Patient informed with all the below.  

## 2021-02-17 ENCOUNTER — Telehealth: Payer: Self-pay

## 2021-02-17 NOTE — Telephone Encounter (Signed)
Patient was seen in the office on 01/31/21 and treated for yeast.  Took both Diflucan tabs and is using betamethasone ointment. For a few days she felt fine and then sx starting recurring.  C/o white vaginal discharge and vaginal itching. Patient thinks her glucose levels are high and this may be why it is recurring. She is waiting on ins card to see physician who helps her with her diabetes and get medication.

## 2021-02-18 MED ORDER — FLUCONAZOLE 150 MG PO TABS: 150.0000 mg | ORAL_TABLET | Freq: Once | ORAL | 0 refills | Status: AC

## 2021-02-18 NOTE — Telephone Encounter (Signed)
One more diflucan sent in. If her symptoms persist she needs to f/u. Mychart message sent

## 2021-02-26 ENCOUNTER — Other Ambulatory Visit: Payer: Self-pay

## 2021-02-26 ENCOUNTER — Encounter (HOSPITAL_BASED_OUTPATIENT_CLINIC_OR_DEPARTMENT_OTHER): Payer: Self-pay | Admitting: Emergency Medicine

## 2021-02-26 ENCOUNTER — Emergency Department (HOSPITAL_BASED_OUTPATIENT_CLINIC_OR_DEPARTMENT_OTHER)
Admission: EM | Admit: 2021-02-26 | Discharge: 2021-02-26 | Disposition: A | Discharge status: Home / Self Care | Payer: BC Managed Care – PPO | Attending: Emergency Medicine | Admitting: Emergency Medicine

## 2021-02-26 DIAGNOSIS — Z859 Personal history of malignant neoplasm, unspecified: Secondary | ICD-10-CM | POA: Insufficient documentation

## 2021-02-26 DIAGNOSIS — R197 Diarrhea, unspecified: Secondary | ICD-10-CM | POA: Insufficient documentation

## 2021-02-26 DIAGNOSIS — R002 Palpitations: Secondary | ICD-10-CM | POA: Diagnosis not present

## 2021-02-26 DIAGNOSIS — J45909 Unspecified asthma, uncomplicated: Secondary | ICD-10-CM | POA: Diagnosis not present

## 2021-02-26 DIAGNOSIS — R739 Hyperglycemia, unspecified: Secondary | ICD-10-CM

## 2021-02-26 DIAGNOSIS — E039 Hypothyroidism, unspecified: Secondary | ICD-10-CM | POA: Diagnosis not present

## 2021-02-26 DIAGNOSIS — R9431 Abnormal electrocardiogram [ECG] [EKG]: Secondary | ICD-10-CM | POA: Diagnosis not present

## 2021-02-26 DIAGNOSIS — E1165 Type 2 diabetes mellitus with hyperglycemia: Secondary | ICD-10-CM | POA: Diagnosis not present

## 2021-02-26 HISTORY — DX: Type 2 diabetes mellitus without complications: E11.9

## 2021-02-26 LAB — CBC WITH DIFFERENTIAL/PLATELET
Abs Immature Granulocytes: 0.03 10*3/uL (ref 0.00–0.07)
Basophils Absolute: 0.1 10*3/uL (ref 0.0–0.1)
Basophils Relative: 1 %
Eosinophils Absolute: 0.2 10*3/uL (ref 0.0–0.5)
Eosinophils Relative: 2 %
HCT: 43 % (ref 36.0–46.0)
Hemoglobin: 15.2 g/dL — ABNORMAL HIGH (ref 12.0–15.0)
Immature Granulocytes: 0 %
Lymphocytes Relative: 26 %
Lymphs Abs: 2.6 10*3/uL (ref 0.7–4.0)
MCH: 28.6 pg (ref 26.0–34.0)
MCHC: 35.3 g/dL (ref 30.0–36.0)
MCV: 80.8 fL (ref 80.0–100.0)
Monocytes Absolute: 0.6 10*3/uL (ref 0.1–1.0)
Monocytes Relative: 6 %
Neutro Abs: 6.4 10*3/uL (ref 1.7–7.7)
Neutrophils Relative %: 65 %
Platelets: 297 10*3/uL (ref 150–400)
RBC: 5.32 MIL/uL — ABNORMAL HIGH (ref 3.87–5.11)
RDW: 11.6 % (ref 11.5–15.5)
WBC: 9.8 10*3/uL (ref 4.0–10.5)
nRBC: 0 % (ref 0.0–0.2)

## 2021-02-26 LAB — URINALYSIS, ROUTINE W REFLEX MICROSCOPIC
Bilirubin Urine: NEGATIVE
Glucose, UA: >=500 mg/dL — AB
Hgb urine dipstick: NEGATIVE
Ketones, ur: 15 mg/dL — AB
Leukocytes,Ua: NEGATIVE
Nitrite: NEGATIVE
Protein, ur: NEGATIVE mg/dL
Specific Gravity, Urine: 1.015 (ref 1.005–1.030)
pH: 6 (ref 5.0–8.0)

## 2021-02-26 LAB — URINALYSIS, MICROSCOPIC (REFLEX)

## 2021-02-26 LAB — COMPREHENSIVE METABOLIC PANEL
ALT: 84 U/L — ABNORMAL HIGH (ref 0–44)
AST: 35 U/L (ref 15–41)
Albumin: 3.9 g/dL (ref 3.5–5.0)
Alkaline Phosphatase: 75 U/L (ref 38–126)
Anion gap: 9 (ref 5–15)
BUN: 9 mg/dL (ref 6–20)
CO2: 22 mmol/L (ref 22–32)
Calcium: 9.1 mg/dL (ref 8.9–10.3)
Chloride: 102 mmol/L (ref 98–111)
Creatinine, Ser: 0.67 mg/dL (ref 0.44–1.00)
GFR, Estimated: >60 mL/min (ref >60)
Glucose, Bld: 324 mg/dL — ABNORMAL HIGH (ref 70–99)
Potassium: 4 mmol/L (ref 3.5–5.1)
Sodium: 133 mmol/L — ABNORMAL LOW (ref 135–145)
Total Bilirubin: 0.6 mg/dL (ref 0.3–1.2)
Total Protein: 7 g/dL (ref 6.5–8.1)

## 2021-02-26 LAB — CBG MONITORING, ED: Glucose-Capillary: 339 mg/dL — ABNORMAL HIGH (ref 70–99)

## 2021-02-26 LAB — LIPASE, BLOOD: Lipase: 38 U/L (ref 11–51)

## 2021-02-26 LAB — PREGNANCY, URINE: Preg Test, Ur: NEGATIVE

## 2021-02-26 NOTE — ED Notes (Signed)
Patient Alert and oriented to baseline. Stable and ambulatory to baseline. Patient verbalized understanding of the discharge instructions.  Patient belongings were taken by the patient.   

## 2021-02-26 NOTE — ED Triage Notes (Signed)
Reports hyperglycemia for a while.  [place] so has not had medications.  Has appointment with PCP tomorrow.

## 2021-02-26 NOTE — ED Provider Notes (Signed)
[facility] Provider Note   CSN: [id number] Arrival date & time: 02/26/21  1026     History Chief Complaint  Patient presents with   Hyperglycemia    Melissa Riggs is a 25 y.o. female.  With past medical history of type 2 diabetes who presents to the emergency department with hyperglycemia.  She states that since yesterday she has felt dizzy and off-balance with intermittent palpitations.  Noticed that she had increased urine output and felt thirsty.  Also endorses nausea without vomiting and diarrhea since yesterday.  She denies recent illnesses, abdominal pain, urinary complaints.  States that she has not taken her oral antidiabetic medication in "months."  States that she had enough medication for about a month, went out of the country for a number of months and was unable to refill her prescription.  She states that after leaving the country she did not have a job so did not have insurance.  She states since she has been back she has been able to find a new job, obtain insurance and has a primary care appointment tomorrow.  Last A1c in January 11.1%   Hyperglycemia Associated symptoms: dizziness, increased thirst, nausea and polyuria   Associated symptoms: no abdominal pain, no chest pain, no dysuria, no fever and no vomiting       Past Medical History:  Diagnosis Date   Amenorrhea    Asthma    Asthma    when younger, not current problem   Cancer ([place])    Complication of anesthesia    slow to awaken after tonsils and adenoid surgery yrs ago   Diabetes mellitus without complication ([place])    Dysmenorrhea    Female infertility    Hormone disorder    PCOS (polycystic ovarian syndrome)    Prediabetes    Vision abnormalities    wears glasses    Patient Active Problem List   Diagnosis Date Noted   Diabetes mellitus ([place]) 08/30/2020   Vitamin D deficiency 08/30/2020   Class 3 severe obesity with serious comorbidity and body mass  index (BMI) of 45.0 to 49.9 in adult ([place]) 08/30/2020   Morbid obesity ([place]) 06/20/2020   Diabetes mellitus type 2 in obese ([place]) 06/18/2020   Viral URI 12/23/2018   Exposure to COVID-19 virus 11/18/2018   Suspected COVID-19 virus infection 11/18/2018   Hypothyroidism 03/02/2018   Anovulation 03/02/2018    Past Surgical History:  Procedure Laterality Date   ADENOIDECTOMY     APPENDECTOMY     CARPAL TUNNEL RELEASE  12/14/2011   Procedure: CARPAL TUNNEL RELEASE;  Surgeon: Tennis Must, MD;  Location: [facility];  Service: Orthopedics;  Laterality: Left;   CARPAL TUNNEL RELEASE  01/28/2012   Procedure: CARPAL TUNNEL RELEASE;  Surgeon: Tennis Must, MD;  Location: [facility];  Service: Orthopedics;  Laterality: Right;  right carpal tunnel release   CARPAL TUNNEL RELEASE Bilateral    CATARACT EXTRACTION, BILATERAL     DILATATION & CURETTAGE/HYSTEROSCOPY WITH MYOSURE N/A 08/15/2018   Procedure: DILATATION & CURETTAGE/HYSTEROSCOPY WITH MYOSURE;  Surgeon: [name], MD;  Location: [facility];  Service: Gynecology;  Laterality: N/A;  endometrial polyp   DILATION AND CURETTAGE OF UTERUS  for miscarriage this year   TONSILLECTOMY     TONSILLECTOMY     and adenoid      OB History     Gravida  0   Para  0   Term  0  Preterm  0   AB  0   Living  0      SAB  0   IAB  0   Ectopic  0   Multiple  0   Live Births  0           Family History  Problem Relation Age of Onset   Depression Brother    Drug abuse Brother    Heart disease Maternal Grandmother    Hypertension Maternal Grandmother    Hypertension Paternal Grandmother    Depression Paternal Grandmother    Diabetes Mother    Obesity Mother    High Cholesterol Father    Alcohol abuse Father     Social History   Tobacco Use   Smoking status: Never   Smokeless tobacco: Never  Vaping Use   Vaping Use: Never used  Substance Use Topics   Alcohol use: No    Drug use: No    Home Medications Prior to Admission medications   Medication Sig Start Date End Date Taking? Authorizing Provider  betamethasone valerate ointment (VALISONE) 0.1 % Apply 1 application topically 2 (two) times daily. 01/31/21   [name], MD  blood glucose meter kit and supplies KIT Use too Check FBS a.m. and 1 hour after evening meal 06/19/20   Jearld Lesch A, DO  glucose blood test strip Use as instructed 06/19/20   Jearld Lesch A, DO  Lancets 30G MISC Use to check blood sugar bid 06/19/20   Georgia Lopes, DO  levonorgestrel (MIRENA) 20 MCG/24HR IUD 1 Intra Uterine Device (1 each total) by Intrauterine route once for 1 dose. To treat endometrial cancer, fertility sparing 08/23/18 03/29/20  Joylene John D, NP    Allergies    Betadine [povidone iodine], Amoxicillin, Amoxicillin, and Penicillins  Review of Systems   Review of Systems  Constitutional:  Negative for appetite change and fever.  Eyes:  Positive for visual disturbance.  Cardiovascular:  Positive for palpitations. Negative for chest pain.  Gastrointestinal:  Positive for diarrhea and nausea. Negative for abdominal pain and vomiting.  Endocrine: Positive for polydipsia and polyuria.  Genitourinary:  Negative for dysuria.  Neurological:  Positive for dizziness. Negative for syncope.  All other systems reviewed and are negative.  Physical Exam Updated Vital Signs BP (!) 129/94 (BP Location: Right Arm)   Pulse 88   Temp 98.2 F (36.8 C)   Resp 18   Ht $R'5\' 3"'KX$  (1.6 m)   Wt 125.2 kg   LMP 11/13/2020   SpO2 97%   BMI 48.89 kg/m   Physical Exam Vitals and nursing note reviewed.  Constitutional:      General: She is not in acute distress.    Appearance: Normal appearance. She is not toxic-appearing.  HENT:     Head: Normocephalic and atraumatic.     Mouth/Throat:     Mouth: Mucous membranes are moist.     Pharynx: Oropharynx is clear.  Eyes:     General: No scleral icterus.    Extraocular  Movements: Extraocular movements intact.     Pupils: Pupils are equal, round, and reactive to light.  Cardiovascular:     Rate and Rhythm: Normal rate and regular rhythm.     Heart sounds: No murmur heard. Pulmonary:     Effort: Pulmonary effort is normal.     Breath sounds: Normal breath sounds.  Abdominal:     General: Bowel sounds are normal. There is no distension.     Palpations: Abdomen  is soft.     Tenderness: There is no abdominal tenderness.  Musculoskeletal:     Cervical back: Normal range of motion.  Skin:    General: Skin is warm and dry.     Capillary Refill: Capillary refill takes less than 2 seconds.  Neurological:     General: No focal deficit present.     Mental Status: She is alert and oriented to person, place, and time. Mental status is at baseline.  Psychiatric:        Mood and Affect: Mood normal.        Behavior: Behavior normal.    ED Results / Procedures / Treatments   Labs (all labs ordered are listed, but only abnormal results are displayed) Labs Reviewed  COMPREHENSIVE METABOLIC PANEL - Abnormal; Notable for the following components:      Result Value   Sodium 133 (*)    Glucose, Bld 324 (*)    ALT 84 (*)    All other components within normal limits  CBC WITH DIFFERENTIAL/PLATELET - Abnormal; Notable for the following components:   RBC 5.32 (*)    Hemoglobin 15.2 (*)    All other components within normal limits  URINALYSIS, ROUTINE W REFLEX MICROSCOPIC - Abnormal; Notable for the following components:   Glucose, UA >=500 (*)    Ketones, ur 15 (*)    All other components within normal limits  URINALYSIS, MICROSCOPIC (REFLEX) - Abnormal; Notable for the following components:   Bacteria, UA FEW (*)    All other components within normal limits  CBG MONITORING, ED - Abnormal; Notable for the following components:   Glucose-Capillary 339 (*)    All other components within normal limits  LIPASE, BLOOD  PREGNANCY, URINE    EKG None  Radiology No results found.  Procedures Procedures   Medications Ordered in ED Medications - No data to display  ED Course  I have reviewed the triage vital signs and the nursing notes.  Pertinent labs & imaging results that were available during my care of the patient were reviewed by me and considered in my medical decision making (see chart for details).    MDM Rules/Calculators/A&P 25 year old female with history of type 2 diabetes who presents emergency department with hyperglycemia.  Considered DKA, sepsis as etiology of the patient's current presentation.  However given her history and physical and work-up presentation is consistent with hyperglycemia without DKA or HHS.  The patient is nontoxic-appearing with no sign of infection.  UA without sign of infection, only ketonuria.  CMP glucose of 324 without anion gap.  Lipase negative Obtain EKG given palpitations which was unremarkable.  She has a follow-up appointment with a new PCP tomorrow morning.  Instructed her to ensure she makes it to this appointment so she can restart her oral antidiabetic's.  She is hemodynamically stable for discharge.  All of her questions have been answered.  She is stable for discharge Final Clinical Impression(s) / ED Diagnoses Final diagnoses:  Hyperglycemia    Rx / DC Orders ED Discharge Orders     None        Mickie Hillier, PA-C 02/26/21 [place] A, DO 02/27/21 1901

## 2021-02-26 NOTE — Discharge Instructions (Signed)
You were seen in the emergency department today for hyperglycemia which is an increase in your blood sugar.  Your lab results showed that you were not in diabetic ketoacidosis and in need of rapid correction of your blood sugar.  The rest of your work-up has been reassuring.  You do have a follow-up appointment with your primary care doctor tomorrow morning with Dr. Owens Shark.  Please do not miss this appointment see that you can be restarted on your home antidiabetic medication.  As we discussed at the bedside please return to the emergency department for increasing work of breathing, increasing dizziness or lightheadedness, fevers abdominal pain nausea vomiting.

## 2021-02-27 ENCOUNTER — Ambulatory Visit (INDEPENDENT_AMBULATORY_CARE_PROVIDER_SITE_OTHER): Payer: BC Managed Care – PPO | Admitting: Bariatrics

## 2021-02-27 ENCOUNTER — Encounter (INDEPENDENT_AMBULATORY_CARE_PROVIDER_SITE_OTHER): Payer: Self-pay | Admitting: Bariatrics

## 2021-02-27 VITALS — BP 123/85 | HR 77 | Temp 98.0°F | Ht 63.0 in | Wt 273.0 lb

## 2021-02-27 DIAGNOSIS — Z9189 Other specified personal risk factors, not elsewhere classified: Secondary | ICD-10-CM | POA: Diagnosis not present

## 2021-02-27 DIAGNOSIS — E1169 Type 2 diabetes mellitus with other specified complication: Secondary | ICD-10-CM

## 2021-02-27 DIAGNOSIS — E559 Vitamin D deficiency, unspecified: Secondary | ICD-10-CM

## 2021-02-27 DIAGNOSIS — R748 Abnormal levels of other serum enzymes: Secondary | ICD-10-CM

## 2021-02-27 DIAGNOSIS — Z6841 Body Mass Index (BMI) 40.0 and over, adult: Secondary | ICD-10-CM

## 2021-02-27 MED ORDER — METFORMIN HCL ER 750 MG PO TB24: 750.0000 mg | ORAL_TABLET | Freq: Every day | ORAL | 0 refills | Status: DC

## 2021-02-27 MED ORDER — TIRZEPATIDE 2.5 MG/0.5ML ~~LOC~~ SOAJ: 2.5000 mg | SUBCUTANEOUS | 0 refills | Status: DC

## 2021-02-27 NOTE — Progress Notes (Signed)
Chief Complaint:   Melissa Riggs is here to discuss her progress with her obesity treatment plan along with follow-up of her obesity related diagnoses. Melissa Riggs is on the Category 2 Plan and states she is following her eating plan approximately 0% of the time. Melissa Riggs states she is doing 0 minutes 0 times per week.  Today's visit was #: 6 Starting weight: 284 lbs Starting date: 06/17/2020 Today's weight: 273 lbs Today's date: 02/27/2021 Total lbs lost to date: 11 lbs Total lbs lost since last in-office visit: 12 lbs  Interim History: Melissa Riggs is down 12 lbs since her last visit. She was last seen on 09/09/2020.  Subjective:   1. Type 2 diabetes mellitus with other specified complication, without long-term current use of insulin (Melissa Riggs) Melissa Riggs went to the ER yesterday for hyperglycemia, and was given IV  fluids ./ No medication changes and denies DKA. She has been out of medications for diabetes for some time.   2. Vitamin D deficiency Melissa Riggs is currently not on medications.   3. Elevated liver enzymes Melissa Riggs denies abdominal pain.  4. At risk for hyperglycemia Melissa Riggs is at risk for hyperglycemia due to diabete mellitus.  Assessment/Plan:   1. Type 2 diabetes mellitus with other specified complication, without long-term current use of insulin ([facility]) Good blood sugar control is important to decrease the likelihood of diabetic complications such as nephropathy, neuropathy, limb loss, blindness, coronary artery disease, and death. We will check labs today. Intensive lifestyle modification including diet, exercise and weight loss are the first line of treatment for diabetes.   - Comprehensive metabolic panel - Hemoglobin A1c - Insulin, random - Microalbumin / creatinine urine ratio - metFORMIN (GLUCOPHAGE XR) 750 MG 24 hr tablet; Take 1 tablet (750 mg total) by mouth daily with breakfast.  Dispense: 30 tablet; Refill: 0  2. Vitamin D deficiency We will check Vitamin D today.  Low Vitamin D level contributes to fatigue and are associated with obesity, breast, and colon cancer.   - Comprehensive metabolic panel - VITAMIN D 25 Hydroxy (Vit-D Deficiency, Fractures)  3. Elevated liver enzymes We will check labs to day.   - Comprehensive metabolic panel - Lipid Panel With LDL/HDL Ratio  4. At risk for hyperglycemia Melissa Riggs was given approximately 15 minutes of counseling today regarding prevention of hyperglycemia. She was advised of hyperglycemia causes and the fact hyperglycemia is often asymptomatic. Melissa Riggs was instructed to avoid skipping meals, eat regular protein rich meals and schedule low calorie but protein rich snacks as needed.   Repetitive spaced learning was employed today to elicit superior memory formation and behavioral change   5. Obesity, current BMI 48.4 Melissa Riggs is currently in the action stage of change. As such, her goal is to continue with weight loss efforts. She has agreed to the Category 2 Plan.   Melissa Riggs will continue meal planning and intentional eating.   Exercise goals: No exercise has been prescribed at this time.  Behavioral modification strategies: increasing lean protein intake, decreasing simple carbohydrates, increasing vegetables, increasing water intake, decreasing eating out, no skipping meals, meal planning and cooking strategies, keeping healthy foods in the home, and planning for success.  Melissa Riggs has agreed to follow-up with our clinic in 2 weeks. She was informed of the importance of frequent follow-up visits to maximize her success with intensive lifestyle modifications for her multiple health conditions.   Melissa Riggs was informed we would discuss her lab results at her next visit unless there is a critical issue that  needs to be addressed sooner. Melissa Riggs agreed to keep her next visit at the agreed upon time to discuss these results.  Objective:   Blood pressure 123/85, pulse 77, temperature 98 F (36.7 C), height 5\' 3"   (1.6 m), weight 273 lb (123.8 kg), SpO2 98 %. Body mass index is 48.36 kg/m.  General: Cooperative, alert, well developed, in no acute distress. HEENT: Conjunctivae and lids unremarkable. Cardiovascular: Regular rhythm.  Lungs: Normal work of breathing. Neurologic: No focal deficits.   Lab Results  Component Value Date   CREATININE 0.67 02/26/2021   BUN 9 02/26/2021   NA 133 (L) 02/26/2021   K 4.0 02/26/2021   CL 102 02/26/2021   CO2 22 02/26/2021   Lab Results  Component Value Date   ALT 84 (H) 02/26/2021   AST 35 02/26/2021   ALKPHOS 75 02/26/2021   BILITOT 0.6 02/26/2021   Lab Results  Component Value Date   HGBA1C 11.4 (H) 06/17/2020   HGBA1C 5.9 03/02/2018   Lab Results  Component Value Date   INSULIN 54.7 (H) 06/17/2020   Lab Results  Component Value Date   TSH 1.540 06/17/2020   Lab Results  Component Value Date   CHOL 231 (H) 06/17/2020   HDL 32 (L) 06/17/2020   LDLCALC 138 (H) 06/17/2020   TRIG 337 (H) 06/17/2020   Lab Results  Component Value Date   VD25OH 5.6 (L) 06/17/2020   Lab Results  Component Value Date   WBC 9.8 02/26/2021   HGB 15.2 (H) 02/26/2021   HCT 43.0 02/26/2021   MCV 80.8 02/26/2021   PLT 297 02/26/2021   No results found for: IRON, TIBC, FERRITIN  Attestation Statements:   Reviewed by clinician on day of visit: allergies, medications, problem list, medical history, surgical history, family history, social history, and previous encounter notes.   I, Lizbeth Bark, RMA, am acting as Location manager for [employer], DO.   I have reviewed the above documentation for accuracy and completeness, and I agree with the above. Jearld Lesch, DO

## 2021-02-28 LAB — COMPREHENSIVE METABOLIC PANEL
ALT: 79 IU/L — ABNORMAL HIGH (ref 0–32)
AST: 37 IU/L (ref 0–40)
Albumin/Globulin Ratio: 1.8 (ref 1.2–2.2)
Albumin: 4.5 g/dL (ref 3.9–5.0)
Alkaline Phosphatase: 99 IU/L (ref 44–121)
BUN/Creatinine Ratio: 12 (ref 9–23)
BUN: 7 mg/dL (ref 6–20)
Bilirubin Total: 0.5 mg/dL (ref 0.0–1.2)
CO2: 20 mmol/L (ref 20–29)
Calcium: 9.6 mg/dL (ref 8.7–10.2)
Chloride: 97 mmol/L (ref 96–106)
Creatinine, Ser: 0.58 mg/dL (ref 0.57–1.00)
Globulin, Total: 2.5 g/dL (ref 1.5–4.5)
Glucose: 194 mg/dL — ABNORMAL HIGH (ref 65–99)
Potassium: 4.1 mmol/L (ref 3.5–5.2)
Sodium: 135 mmol/L (ref 134–144)
Total Protein: 7 g/dL (ref 6.0–8.5)
eGFR: 127 mL/min/{1.73_m2} (ref >59)

## 2021-02-28 LAB — LIPID PANEL WITH LDL/HDL RATIO
Cholesterol, Total: 237 mg/dL — ABNORMAL HIGH (ref 100–199)
HDL: 44 mg/dL (ref >39)
LDL Chol Calc (NIH): 156 mg/dL — ABNORMAL HIGH (ref 0–99)
LDL/HDL Ratio: 3.5 ratio — ABNORMAL HIGH (ref 0.0–3.2)
Triglycerides: 200 mg/dL — ABNORMAL HIGH (ref 0–149)
VLDL Cholesterol Cal: 37 mg/dL (ref 5–40)

## 2021-02-28 LAB — INSULIN, RANDOM: INSULIN: 32.9 u[IU]/mL — ABNORMAL HIGH (ref 2.6–24.9)

## 2021-02-28 LAB — HEMOGLOBIN A1C
Est. average glucose Bld gHb Est-mCnc: 283 mg/dL
Hgb A1c MFr Bld: 11.5 % — ABNORMAL HIGH (ref 4.8–5.6)

## 2021-02-28 LAB — VITAMIN D 25 HYDROXY (VIT D DEFICIENCY, FRACTURES): Vit D, 25-Hydroxy: 15.3 ng/mL — ABNORMAL LOW (ref 30.0–100.0)

## 2021-03-17 ENCOUNTER — Other Ambulatory Visit: Payer: Self-pay

## 2021-03-17 ENCOUNTER — Encounter (INDEPENDENT_AMBULATORY_CARE_PROVIDER_SITE_OTHER): Payer: Self-pay | Admitting: Bariatrics

## 2021-03-17 ENCOUNTER — Ambulatory Visit (INDEPENDENT_AMBULATORY_CARE_PROVIDER_SITE_OTHER): Payer: BC Managed Care – PPO | Admitting: Bariatrics

## 2021-03-17 VITALS — BP 130/76 | HR 86 | Temp 98.3°F | Ht 63.0 in | Wt 269.0 lb

## 2021-03-17 DIAGNOSIS — E559 Vitamin D deficiency, unspecified: Secondary | ICD-10-CM

## 2021-03-17 DIAGNOSIS — E669 Obesity, unspecified: Secondary | ICD-10-CM | POA: Diagnosis not present

## 2021-03-17 DIAGNOSIS — Z6841 Body Mass Index (BMI) 40.0 and over, adult: Secondary | ICD-10-CM

## 2021-03-17 DIAGNOSIS — E1169 Type 2 diabetes mellitus with other specified complication: Secondary | ICD-10-CM | POA: Diagnosis not present

## 2021-03-17 MED ORDER — TIRZEPATIDE 2.5 MG/0.5ML ~~LOC~~ SOAJ: 2.5000 mg | SUBCUTANEOUS | 0 refills | Status: DC

## 2021-03-17 MED ORDER — TIRZEPATIDE 5 MG/0.5ML ~~LOC~~ SOAJ: 5.0000 mg | SUBCUTANEOUS | 0 refills | Status: DC

## 2021-03-17 NOTE — Progress Notes (Signed)
Chief Complaint:   Melissa Riggs is here to discuss her progress with her obesity treatment plan along with follow-up of her obesity related diagnoses. Melissa Riggs is on the Category 2 Plan and states she is following her eating plan approximately 60-70% of the time. Melissa Riggs states she is doing 0 minutes 0 times per week.  Today's visit was #: 7 Starting weight: 284 lbs Starting date: 06/17/2020 Today's weight: 269 lbs Today's date: 03/17/2021 Total lbs lost to date: 15 lbs Total lbs lost since last in-office visit: 4 lbs  Interim History: Melissa Riggs is down 4 lbs since her last visit.  Subjective:   1. Diabetes mellitus type 2 in obese ([place]) Melissa Riggs is taking Mounjaro 2.5 mg and Metformin currently. Her fasting blood sugar was 103. Her 2 Post Prandial was in the range of 160's. She denies side effects.   2. Vitamin D deficiency Melissa Riggs is taking Vitamin D currently.  Assessment/Plan:   1. Diabetes mellitus type 2 in obese ([place]) Sheyna agrees to start Mounjaro 5 mg with no refills. Good blood sugar control is important to decrease the likelihood of diabetic complications such as nephropathy, neuropathy, limb loss, blindness, coronary artery disease, and death. Intensive lifestyle modification including diet, exercise and weight loss are the first line of treatment for diabetes.    2. Vitamin D deficiency Low Vitamin D level contributes to fatigue and are associated with obesity, breast, and colon cancer. Melissa Riggs agrees to continue to take prescription Vitamin D 50,000 IU every week and will follow-up for routine testing of Vitamin D, at least 2-3 times per year to avoid over-replacement.   3. Obesity, current BMI of 47.7 Melissa Riggs is currently in the action stage of change. As such, her goal is to continue with weight loss efforts. She has agreed to the Category 2 Plan.   Melissa Riggs will continue meal planning. She will adhere closely to the plan 80-90%. She will increase her water  intake.  Exercise goals: No exercise has been prescribed at this time.  Behavioral modification strategies: increasing lean protein intake, decreasing simple carbohydrates, increasing vegetables, increasing water intake, decreasing eating out, no skipping meals, meal planning and cooking strategies, keeping healthy foods in the home, and planning for success.  Melissa Riggs has agreed to follow-up with our clinic in 2-3 weeks. She was informed of the importance of frequent follow-up visits to maximize her success with intensive lifestyle modifications for her multiple health conditions.   Objective:   Blood pressure 130/76, pulse 86, temperature 98.3 F (36.8 C), height 5\' 3"  (1.6 m), weight 269 lb (122 kg), last menstrual period 12/14/2020, SpO2 97 %. Body mass index is 47.65 kg/m.  General: Cooperative, alert, well developed, in no acute distress. HEENT: Conjunctivae and lids unremarkable. Cardiovascular: Regular rhythm.  Lungs: Normal work of breathing. Neurologic: No focal deficits.   Lab Results  Component Value Date   CREATININE 0.58 02/27/2021   BUN 7 02/27/2021   NA 135 02/27/2021   K 4.1 02/27/2021   CL 97 02/27/2021   CO2 20 02/27/2021   Lab Results  Component Value Date   ALT 79 (H) 02/27/2021   AST 37 02/27/2021   ALKPHOS 99 02/27/2021   BILITOT 0.5 02/27/2021   Lab Results  Component Value Date   HGBA1C 11.5 (H) 02/27/2021   HGBA1C 11.4 (H) 06/17/2020   HGBA1C 5.9 03/02/2018   Lab Results  Component Value Date   INSULIN 32.9 (H) 02/27/2021   INSULIN 54.7 (H) 06/17/2020   Lab  Results  Component Value Date   TSH 1.540 06/17/2020   Lab Results  Component Value Date   CHOL 237 (H) 02/27/2021   HDL 44 02/27/2021   LDLCALC 156 (H) 02/27/2021   TRIG 200 (H) 02/27/2021   Lab Results  Component Value Date   VD25OH 15.3 (L) 02/27/2021   VD25OH 5.6 (L) 06/17/2020   Lab Results  Component Value Date   WBC 9.8 02/26/2021   HGB 15.2 (H) 02/26/2021   HCT  43.0 02/26/2021   MCV 80.8 02/26/2021   PLT 297 02/26/2021   No results found for: IRON, TIBC, FERRITIN  Attestation Statements:   Reviewed by clinician on day of visit: allergies, medications, problem list, medical history, surgical history, family history, social history, and previous encounter notes.  I, Lizbeth Bark, RMA, am acting as Location manager for [employer], DO.   I have reviewed the above documentation for accuracy and completeness, and I agree with the above. Jearld Lesch, DO

## 2021-03-19 ENCOUNTER — Encounter (INDEPENDENT_AMBULATORY_CARE_PROVIDER_SITE_OTHER): Payer: Self-pay | Admitting: Bariatrics

## 2021-04-02 ENCOUNTER — Telehealth: Payer: Self-pay | Admitting: Obstetrics and Gynecology

## 2021-04-02 NOTE — Telephone Encounter (Signed)
Left message to call.

## 2021-04-02 NOTE — Telephone Encounter (Signed)
Please reach out to this patient to schedule the endometrial biopsy as previously discussed.

## 2021-04-02 NOTE — Telephone Encounter (Signed)
-----   Message from [place] sent at 03/05/2021  5:34 AM EDT ----- Regarding: Cancellation of Order # [id number] Order number [id number] for the procedure ENDOMETRIAL BIOPSY  [PRO73] has been canceled by [employer] [[email or web address]]. This  procedure was ordered by [name], MD [[id number]]  on Feb 29, 2020 for the patient Melissa Riggs  [[id number]]. The reason for cancellation was "Order Expired".  This was a future order.

## 2021-04-03 ENCOUNTER — Ambulatory Visit (INDEPENDENT_AMBULATORY_CARE_PROVIDER_SITE_OTHER): Payer: PRIVATE HEALTH INSURANCE | Admitting: Bariatrics

## 2021-04-03 ENCOUNTER — Other Ambulatory Visit: Payer: Self-pay

## 2021-04-03 ENCOUNTER — Encounter (INDEPENDENT_AMBULATORY_CARE_PROVIDER_SITE_OTHER): Payer: Self-pay | Admitting: Bariatrics

## 2021-04-03 VITALS — BP 124/89 | HR 85 | Temp 98.1°F | Ht 63.0 in | Wt 268.0 lb

## 2021-04-03 DIAGNOSIS — E559 Vitamin D deficiency, unspecified: Secondary | ICD-10-CM

## 2021-04-03 DIAGNOSIS — Z6841 Body Mass Index (BMI) 40.0 and over, adult: Secondary | ICD-10-CM | POA: Diagnosis not present

## 2021-04-03 DIAGNOSIS — E1169 Type 2 diabetes mellitus with other specified complication: Secondary | ICD-10-CM | POA: Diagnosis not present

## 2021-04-03 MED ORDER — METFORMIN HCL ER 750 MG PO TB24: 750.0000 mg | ORAL_TABLET | Freq: Every day | ORAL | 0 refills | Status: DC

## 2021-04-03 MED ORDER — GLUCOSE BLOOD VI STRP: ORAL_STRIP | 0 refills | Status: DC

## 2021-04-03 MED ORDER — TIRZEPATIDE 7.5 MG/0.5ML ~~LOC~~ SOAJ: 7.5000 mg | SUBCUTANEOUS | 0 refills | Status: DC

## 2021-04-03 NOTE — Progress Notes (Signed)
Chief Complaint:   Melissa Riggs is here to discuss her progress with her obesity treatment plan along with follow-up of her obesity related diagnoses. Melissa Riggs is on the Category 2 Plan and states she is following her eating plan approximately 75% of the time. Melissa Riggs states she is doing cardio for 50-60 minutes 3 times per week.  Today's visit was #: 8 Starting weight: 284 lbs Starting date: 06/17/2020 Today's weight: 268 lbs Today's date: 04/03/2021 Total lbs lost to date: 16 Total lbs lost since last in-office visit: 1  Interim History: Melissa Riggs is down another lb since her last visit.  Subjective:   1. Type 2 diabetes mellitus with other specified complication, without long-term current use of insulin ([place]) [place] notes decreased appetite with no side effects. She is on birthcontrol and also metformin (barrier). Her fasting blood sugars range between 100-106.  2. Vitamin D deficiency Melissa Riggs is stable on OTC Vit D.  Assessment/Plan:   1. Type 2 diabetes mellitus with other specified complication, without long-term current use of insulin ([place]) Tijah agreed to increase Mounjaro to 7.5 mg q weekly with no refills, and we will refill metformin for 1 month. We will send blood glucose test strips to the pharmacy. Good blood sugar control is important to decrease the likelihood of diabetic complications such as nephropathy, neuropathy, limb loss, blindness, coronary artery disease, and death. Intensive lifestyle modification including diet, exercise and weight loss are the first line of treatment for diabetes.   - metFORMIN (GLUCOPHAGE XR) 750 MG 24 hr tablet; Take 1 tablet (750 mg total) by mouth daily with breakfast.  Dispense: 30 tablet; Refill: 0  2. Vitamin D deficiency Low Vitamin D level contributes to fatigue and are associated with obesity, breast, and colon cancer. Melissa Riggs will continue OTC Vitamin D 2,000 IU daily and she will follow-up for routine testing of Vitamin  D, at least 2-3 times per year to avoid over-replacement.  3. Obesity, current BMI of 47.6 Melissa Riggs is currently in the action stage of change. As such, her goal is to continue with weight loss efforts. She has agreed to the Category 2 Plan.   Intentional eating was discussed.   Exercise goals: As is.  Behavioral modification strategies: increasing lean protein intake, decreasing simple carbohydrates, increasing vegetables, increasing water intake, decreasing eating out, no skipping meals, meal planning and cooking strategies, keeping healthy foods in the home, and planning for success.  [name] has agreed to follow-up with our clinic in 3 weeks. She was informed of the importance of frequent follow-up visits to maximize her success with intensive lifestyle modifications for her multiple health conditions.   Objective:   Blood pressure 124/89, pulse 85, temperature 98.1 F (36.7 C), height 5\' 3"  (1.6 m), weight 268 lb (121.6 kg), SpO2 97 %. Body mass index is 47.47 kg/m.  General: Cooperative, alert, well developed, in no acute distress. HEENT: Conjunctivae and lids unremarkable. Cardiovascular: Regular rhythm.  Lungs: Normal work of breathing. Neurologic: No focal deficits.   Lab Results  Component Value Date   CREATININE 0.58 02/27/2021   BUN 7 02/27/2021   NA 135 02/27/2021   K 4.1 02/27/2021   CL 97 02/27/2021   CO2 20 02/27/2021   Lab Results  Component Value Date   ALT 79 (H) 02/27/2021   AST 37 02/27/2021   ALKPHOS 99 02/27/2021   BILITOT 0.5 02/27/2021   Lab Results  Component Value Date   HGBA1C 11.5 (H) 02/27/2021   HGBA1C 11.4 (H)  06/17/2020   HGBA1C 5.9 03/02/2018   Lab Results  Component Value Date   INSULIN 32.9 (H) 02/27/2021   INSULIN 54.7 (H) 06/17/2020   Lab Results  Component Value Date   TSH 1.540 06/17/2020   Lab Results  Component Value Date   CHOL 237 (H) 02/27/2021   HDL 44 02/27/2021   LDLCALC 156 (H) 02/27/2021   TRIG 200 (H)  02/27/2021   Lab Results  Component Value Date   VD25OH 15.3 (L) 02/27/2021   VD25OH 5.6 (L) 06/17/2020   Lab Results  Component Value Date   WBC 9.8 02/26/2021   HGB 15.2 (H) 02/26/2021   HCT 43.0 02/26/2021   MCV 80.8 02/26/2021   PLT 297 02/26/2021   No results found for: IRON, TIBC, FERRITIN  Attestation Statements:   Reviewed by clinician on day of visit: allergies, medications, problem list, medical history, surgical history, family history, social history, and previous encounter notes.   Wilhemena Durie, am acting as Location manager for [employer], DO.  I have reviewed the above documentation for accuracy and completeness, and I agree with the above. Jearld Lesch, DO

## 2021-04-07 NOTE — Telephone Encounter (Signed)
Left message for patient to call.

## 2021-04-09 NOTE — Telephone Encounter (Signed)
Dr.Jertson patient is scheduled on 04/15/21 for biopsy. Per telephone encounter on 02/04/21 you mentioned " I can pre treat her with ibuprofen and percocet if she wants. She would need a driver and need to do either verbal consent over the phone or come and sign the consent (can't do on percocet)."  Patient said she would like Rx's sent to pharmacy and I will handle the consent.

## 2021-04-09 NOTE — Telephone Encounter (Signed)
Patient called back in triage voicemail stating she is ready to schedule endometrial biopsy. Message sent to appointments to schedule this.

## 2021-04-10 MED ORDER — IBUPROFEN 800 MG PO TABS: 800.0000 mg | ORAL_TABLET | Freq: Three times a day (TID) | ORAL | 1 refills | Status: DC | PRN

## 2021-04-10 MED ORDER — OXYCODONE-ACETAMINOPHEN 5-325 MG PO TABS: ORAL_TABLET | ORAL | 0 refills | Status: DC

## 2021-04-10 NOTE — Telephone Encounter (Signed)
I've sent in a script for ibuprofen and percocet. She should take one ibuprofen and 2 percocet 1 hour prior to her visit. Needs verbal consent prior to medication and a driver.

## 2021-04-10 NOTE — Telephone Encounter (Signed)
Patient informed with below note, consent sent to patient via personal e-mail she will e-mail back asap.

## 2021-04-11 NOTE — Telephone Encounter (Signed)
Consent form signed and given to [facility].

## 2021-04-15 ENCOUNTER — Ambulatory Visit: Payer: PRIVATE HEALTH INSURANCE | Admitting: Obstetrics and Gynecology

## 2021-04-24 ENCOUNTER — Other Ambulatory Visit: Payer: Self-pay

## 2021-04-24 ENCOUNTER — Ambulatory Visit (INDEPENDENT_AMBULATORY_CARE_PROVIDER_SITE_OTHER): Payer: PRIVATE HEALTH INSURANCE | Admitting: Bariatrics

## 2021-04-24 ENCOUNTER — Encounter (INDEPENDENT_AMBULATORY_CARE_PROVIDER_SITE_OTHER): Payer: Self-pay | Admitting: Bariatrics

## 2021-04-24 VITALS — BP 121/87 | HR 78 | Temp 98.2°F | Ht 63.0 in | Wt 262.0 lb

## 2021-04-24 DIAGNOSIS — E1169 Type 2 diabetes mellitus with other specified complication: Secondary | ICD-10-CM | POA: Diagnosis not present

## 2021-04-24 DIAGNOSIS — Z6841 Body Mass Index (BMI) 40.0 and over, adult: Secondary | ICD-10-CM

## 2021-04-24 DIAGNOSIS — E559 Vitamin D deficiency, unspecified: Secondary | ICD-10-CM | POA: Diagnosis not present

## 2021-04-24 NOTE — Progress Notes (Signed)
Chief Complaint:   [place] is here to discuss her progress with her obesity treatment plan along with follow-up of her obesity related diagnoses. Melissa Riggs is on the Category 2 Plan and states she is following her eating plan approximately 50% of the time. Melissa Riggs states she is doing 0 minutes 0 times per week.  Today's visit was #: 9 Starting weight: 284 lbs Starting date: 06/17/2020 Today's weight: 262 lbs Today's date: 04/24/2021 Total lbs lost to date: 22 lbs Total lbs lost since last in-office visit: 6 lbs  Interim History: Melissa Riggs is down 6 lbs and doing well overall. She is skipping a few meals.  Subjective:   1. Vitamin D deficiency Melissa Riggs is taking Vitamin D currently. Her last Vitamin D level was 15.3.  2. Type 2 diabetes mellitus with other specified complication, without long-term current use of insulin (H[facility]) Melissa Riggs is currently taking Mounjaro. Her appetite has improved. Her last A1C level was 11.5.  Assessment/Plan:   1. Vitamin D deficiency Low Vitamin D level contributes to fatigue and are associated with obesity, breast, and colon cancer. Melissa Riggs agrees to continue to take  Vitamin D and she will follow-up for routine testing of Vitamin D, at least 2-3 times per year to avoid over-replacement.  2. Type 2 diabetes mellitus with other specified complication, without long-term current use of insulin ([place]) Melissa Riggs will continue taking Mounjaro. Good blood sugar control is important to decrease the likelihood of diabetic complications such as nephropathy, neuropathy, limb loss, blindness, coronary artery disease, and death. Intensive lifestyle modification including diet, exercise and weight loss are the first line of treatment for diabetes.   3. Obesity, current BMI of 47.4 Melissa Riggs is currently in the action stage of change. As such, her goal is to continue with weight loss efforts. She has agreed to the Category 2 Plan.   Melissa Riggs will continue meal  planning and she will continue intentional eating. She will follow the plan at least 80-90%.  Exercise goals: No exercise has been prescribed at this time.  Behavioral modification strategies: increasing lean protein intake, decreasing simple carbohydrates, increasing vegetables, increasing water intake, decreasing eating out, no skipping meals, meal planning and cooking strategies, keeping healthy foods in the home, and planning for success.  Melissa Riggs has agreed to follow-up with our clinic in 5 weeks. She was informed of the importance of frequent follow-up visits to maximize her success with intensive lifestyle modifications for her multiple health conditions.   Objective:   Blood pressure 121/87, pulse 78, temperature 98.2 F (36.8 C), height 5\' 3"  (1.6 m), weight 262 lb (118.8 kg), SpO2 97 %. Body mass index is 46.41 kg/m.  General: Cooperative, alert, well developed, in no acute distress. HEENT: Conjunctivae and lids unremarkable. Cardiovascular: Regular rhythm.  Lungs: Normal work of breathing. Neurologic: No focal deficits.   Lab Results  Component Value Date   CREATININE 0.58 02/27/2021   BUN 7 02/27/2021   NA 135 02/27/2021   K 4.1 02/27/2021   CL 97 02/27/2021   CO2 20 02/27/2021   Lab Results  Component Value Date   ALT 79 (H) 02/27/2021   AST 37 02/27/2021   ALKPHOS 99 02/27/2021   BILITOT 0.5 02/27/2021   Lab Results  Component Value Date   HGBA1C 11.5 (H) 02/27/2021   HGBA1C 11.4 (H) 06/17/2020   HGBA1C 5.9 03/02/2018   Lab Results  Component Value Date   INSULIN 32.9 (H) 02/27/2021   INSULIN 54.7 (H) 06/17/2020   Lab  Results  Component Value Date   TSH 1.540 06/17/2020   Lab Results  Component Value Date   CHOL 237 (H) 02/27/2021   HDL 44 02/27/2021   LDLCALC 156 (H) 02/27/2021   TRIG 200 (H) 02/27/2021   Lab Results  Component Value Date   VD25OH 15.3 (L) 02/27/2021   VD25OH 5.6 (L) 06/17/2020   Lab Results  Component Value Date   WBC  9.8 02/26/2021   HGB 15.2 (H) 02/26/2021   HCT 43.0 02/26/2021   MCV 80.8 02/26/2021   PLT 297 02/26/2021   No results found for: IRON, TIBC, FERRITIN  Attestation Statements:   Reviewed by clinician on day of visit: allergies, medications, problem list, medical history, surgical history, family history, social history, and previous encounter notes.   I, Lizbeth Bark, RMA, am acting as Location manager for [employer], DO.   I have reviewed the above documentation for accuracy and completeness, and I agree with the above. Jearld Lesch, DO

## 2021-04-25 NOTE — Telephone Encounter (Signed)
Consent form given back to me from [place], I called patient and read her the consent form. I signed and [employer] was the 2nd witness and verified patient understood patient as well. Form given to [facility].

## 2021-04-28 ENCOUNTER — Other Ambulatory Visit (HOSPITAL_COMMUNITY)
Admission: RE | Admit: 2021-04-28 | Discharge: 2021-04-28 | Disposition: A | Discharge status: Home / Self Care | Payer: Self-pay | Source: Ambulatory Visit | Attending: Obstetrics and Gynecology | Admitting: Obstetrics and Gynecology

## 2021-04-28 ENCOUNTER — Encounter: Payer: Self-pay | Admitting: Obstetrics and Gynecology

## 2021-04-28 ENCOUNTER — Ambulatory Visit (INDEPENDENT_AMBULATORY_CARE_PROVIDER_SITE_OTHER): Payer: PRIVATE HEALTH INSURANCE | Admitting: Obstetrics and Gynecology

## 2021-04-28 ENCOUNTER — Other Ambulatory Visit: Payer: Self-pay

## 2021-04-28 VITALS — BP 120/80 | HR 88 | Ht 63.0 in | Wt 267.0 lb

## 2021-04-28 DIAGNOSIS — N939 Abnormal uterine and vaginal bleeding, unspecified: Secondary | ICD-10-CM | POA: Insufficient documentation

## 2021-04-28 DIAGNOSIS — Z8542 Personal history of malignant neoplasm of other parts of uterus: Secondary | ICD-10-CM | POA: Insufficient documentation

## 2021-04-28 NOTE — Progress Notes (Signed)
GYNECOLOGY  VISIT   HPI: 25 y.o.   Married   Other or two or more races Unavailable Hispanic or Latino  female   [id number] with Patient's last menstrual period was 12/17/2020 (approximate).   here for  endometrial biopsy.  She has a h/o endometrial adenocarcinoma. Has a mirena IUD that was inserted in 3/20. She has been having some irregular bleeding, off and on since January, it was a monthly cycle for many months, then stopped, then spotting. No bleeding for several months. Last endometrial biopsy was in 10/21 and showed inactive endometrium, without atypia, hyperplasia.  She has lost ~30 lbs. Working on loosing more weight.  Prior to the IUD she had unprotected intercourse for years without pregnancy, but she wasn't having regular cycles. Earlier this year she had monthly cycles with the the IUD for many months. She is considering pulling the IUD to see if she has cycles and try to get pregnant. She has previously meet with Dr Kerin Perna to discuss fertility treatments. The cost may be an issue.   GYNECOLOGIC HISTORY: Patient's last menstrual period was 12/17/2020 (approximate). Contraception:Mirena inserted 08/2018  Menopausal hormone therapy: none         OB History     Gravida  0   Para  0   Term  0   Preterm  0   AB  0   Living  0      SAB  0   IAB  0   Ectopic  0   Multiple  0   Live Births  0              Patient Active Problem List   Diagnosis Date Noted   Diabetes mellitus ([place]) 08/30/2020   Vitamin D deficiency 08/30/2020   Class 3 severe obesity with serious comorbidity and body mass index (BMI) of 45.0 to 49.9 in adult ([place]) 08/30/2020   Morbid obesity ([place]) 06/20/2020   Diabetes mellitus type 2 in obese ([place]) 06/18/2020   Viral URI 12/23/2018   Exposure to COVID-19 virus 11/18/2018   Suspected COVID-19 virus infection 11/18/2018   Hypothyroidism 03/02/2018   Anovulation 03/02/2018    Past Medical History:  Diagnosis Date   Amenorrhea     Asthma    Asthma    when younger, not current problem   Cancer ([place])    Complication of anesthesia    slow to awaken after tonsils and adenoid surgery yrs ago   Diabetes mellitus without complication ([place])    Dysmenorrhea    Female infertility    Hormone disorder    PCOS (polycystic ovarian syndrome)    Prediabetes    Vision abnormalities    wears glasses    Past Surgical History:  Procedure Laterality Date   ADENOIDECTOMY     APPENDECTOMY     CARPAL TUNNEL RELEASE  12/14/2011   Procedure: CARPAL TUNNEL RELEASE;  Surgeon: Tennis Must, MD;  Location: [facility];  Service: Orthopedics;  Laterality: Left;   CARPAL TUNNEL RELEASE  01/28/2012   Procedure: CARPAL TUNNEL RELEASE;  Surgeon: Tennis Must, MD;  Location: [place];  Service: Orthopedics;  Laterality: Right;  right carpal tunnel release   CARPAL TUNNEL RELEASE Bilateral    CATARACT EXTRACTION, BILATERAL     DILATATION & CURETTAGE/HYSTEROSCOPY WITH MYOSURE N/A 08/15/2018   Procedure: DILATATION & CURETTAGE/HYSTEROSCOPY WITH MYOSURE;  Surgeon: [name], MD;  Location: [facility];  Service: Gynecology;  Laterality: N/A;  endometrial  polyp   DILATION AND CURETTAGE OF UTERUS  for miscarriage this year   TONSILLECTOMY     TONSILLECTOMY     and adenoid     Current Outpatient Medications  Medication Sig Dispense Refill   blood glucose meter kit and supplies KIT Use too Check FBS a.m. and 1 hour after evening meal 1 each 0   glucose blood test strip Use as instructed 100 each 0   Lancets 30G MISC Use to check blood sugar bid 100 each 0   metFORMIN (GLUCOPHAGE XR) 750 MG 24 hr tablet Take 1 tablet (750 mg total) by mouth daily with breakfast. 30 tablet 0   oxyCODONE-acetaminophen (PERCOCET) 5-325 MG tablet Take 2 tablets one hour prior to procedure 2 tablet 0   tirzepatide (MOUNJARO) 7.5 MG/0.5ML Pen Inject 7.5 mg into the skin once a week. 2 mL 0   levonorgestrel (MIRENA)  20 MCG/24HR IUD 1 Intra Uterine Device (1 each total) by Intrauterine route once for 1 dose. To treat endometrial cancer, fertility sparing 1 each 0   No current facility-administered medications for this visit.     ALLERGIES: Betadine [povidone iodine], Amoxicillin, Amoxicillin, and Penicillins  Family History  Problem Relation Age of Onset   Depression Brother    Drug abuse Brother    Heart disease Maternal Grandmother    Hypertension Maternal Grandmother    Hypertension Paternal Grandmother    Depression Paternal Grandmother    Diabetes Mother    Obesity Mother    High Cholesterol Father    Alcohol abuse Father     Social History   Socioeconomic History   Marital status: Married    Spouse name: Cory Roughen   Number of children: Not on file   Years of education: Not on file   Highest education level: Not on file  Occupational History   Occupation: Passenger transport manager  Tobacco Use   Smoking status: Never   Smokeless tobacco: Never  Vaping Use   Vaping Use: Never used  Substance and Sexual Activity   Alcohol use: No   Drug use: No   Sexual activity: Yes    Birth control/protection: I.U.D.    Comment: had miscarriage may 2013  Other Topics Concern   Not on file  Social History Narrative   ** Merged History Encounter **       Social Determinants of Health   Financial Resource Strain: Not on file  Food Insecurity: Not on file  Transportation Needs: Not on file  Physical Activity: Not on file  Stress: Not on file  Social Connections: Not on file  Intimate Partner Violence: Not on file    Review of Systems  All other systems reviewed and are negative.  PHYSICAL EXAMINATION:    BP 120/80   Pulse 88   Ht $R'5\' 3"'YM$  (1.6 m)   Wt 267 lb (121.1 kg)   LMP 12/17/2020 (Approximate)   SpO2 98%   BMI 47.30 kg/m     General appearance: alert, cooperative and appears stated age  Pelvic: External genitalia:  no lesions              Urethra:  normal appearing urethra with no  masses, tenderness or lesions              Bartholins and Skenes: normal                 Vagina: normal appearing vagina with normal color and discharge, no lesions  Cervix: no lesions, IUD strings 3-4 cm               The risks of endometrial biopsy were reviewed and a consent was obtained.  A speculum was placed in the vagina and the cervix was cleansed with betadine. A tenaculum was placed on the cervix and the pipelle was placed into the endometrial cavity. The uterus sounded to 7 cm. The endometrial biopsy was performed, taking care to get a representative sample, sampling 360 degrees of the uterine cavity. Moderate tissue was obtained. The tenaculum and speculum were removed. There were no complications.    Chaperone was present for exam.  1. History of endometrial cancer - Surgical pathology( [place]/ [facility])  2. Abnormal uterine bleeding ([facility]) - Surgical pathology( / [facility])  Considering conception, working on weight loss, will await biopsy results

## 2021-04-30 LAB — SURGICAL PATHOLOGY

## 2021-05-20 ENCOUNTER — Other Ambulatory Visit (INDEPENDENT_AMBULATORY_CARE_PROVIDER_SITE_OTHER): Payer: Self-pay | Admitting: Bariatrics

## 2021-05-20 DIAGNOSIS — E1169 Type 2 diabetes mellitus with other specified complication: Secondary | ICD-10-CM

## 2021-05-20 MED ORDER — METFORMIN HCL ER 750 MG PO TB24: 750.0000 mg | ORAL_TABLET | Freq: Every day | ORAL | 0 refills | Status: DC

## 2021-05-20 MED ORDER — TIRZEPATIDE 7.5 MG/0.5ML ~~LOC~~ SOAJ
7.5000 mg | SUBCUTANEOUS | 0 refills | Status: DC
Start: 2021-05-20 — End: 2021-10-16

## 2021-05-20 NOTE — Telephone Encounter (Signed)
LAST APPOINTMENT DATE: 04/24/21 NEXT APPOINTMENT DATE: 05/29/21   [place] ([place]), [place] - [address] [place] ([place]) [place] [address] Phone: [phone] Fax: [phone]  Patient is requesting a refill of the following medications: Pending Prescriptions:                       Disp   Refills   metFORMIN (GLUCOPHAGE XR) 750 MG 24 hr tab*30 tab*0       Sig: Take 1 tablet (750 mg total) by mouth daily with          breakfast.   tirzepatide (MOUNJARO) 7.5 MG/0.5ML Pen    2 mL   0       Sig: Inject 7.5 mg into the skin once a week.   Date last filled: 04/03/21 Previously prescribed by Dr Owens Shark  Lab Results      Component                Value               Date                      HGBA1C                   11.5 (H)            02/27/2021                HGBA1C                   11.4 (H)            06/17/2020                HGBA1C                   5.9                 03/02/2018           Lab Results      Component                Value               Date                      LDLCALC                  156 (H)             02/27/2021                CREATININE               0.58                02/27/2021           Lab Results      Component                Value               Date                      VD25OH                   15.3 (L)            02/27/2021  VD25OH                   5.6 (L)             06/17/2020            BP Readings from Last 3 Encounters: 04/28/21 : 120/80 04/24/21 : 121/87 04/03/21 : 124/89

## 2021-05-29 ENCOUNTER — Ambulatory Visit (INDEPENDENT_AMBULATORY_CARE_PROVIDER_SITE_OTHER): Payer: PRIVATE HEALTH INSURANCE | Admitting: Bariatrics

## 2021-06-12 ENCOUNTER — Other Ambulatory Visit: Payer: Self-pay

## 2021-06-12 ENCOUNTER — Encounter (INDEPENDENT_AMBULATORY_CARE_PROVIDER_SITE_OTHER): Payer: Self-pay | Admitting: Bariatrics

## 2021-06-12 ENCOUNTER — Ambulatory Visit (INDEPENDENT_AMBULATORY_CARE_PROVIDER_SITE_OTHER): Payer: PRIVATE HEALTH INSURANCE | Admitting: Bariatrics

## 2021-06-12 VITALS — BP 131/87 | HR 75 | Temp 98.1°F | Ht 63.0 in | Wt 272.0 lb

## 2021-06-12 DIAGNOSIS — E1169 Type 2 diabetes mellitus with other specified complication: Secondary | ICD-10-CM | POA: Diagnosis not present

## 2021-06-12 DIAGNOSIS — R748 Abnormal levels of other serum enzymes: Secondary | ICD-10-CM | POA: Diagnosis not present

## 2021-06-12 DIAGNOSIS — Z6841 Body Mass Index (BMI) 40.0 and over, adult: Secondary | ICD-10-CM

## 2021-06-12 MED ORDER — METFORMIN HCL ER 750 MG PO TB24: 750.0000 mg | ORAL_TABLET | Freq: Every day | ORAL | 0 refills | Status: DC

## 2021-06-12 MED ORDER — GLIMEPIRIDE 2 MG PO TABS: 2.0000 mg | ORAL_TABLET | Freq: Every day | ORAL | 0 refills | Status: DC

## 2021-06-16 NOTE — Progress Notes (Signed)
Chief Complaint:   Melissa Riggs is here to discuss her progress with her obesity treatment plan along with follow-up of her obesity related diagnoses. Melissa Riggs is on the Category 2 Plan and states she is following her eating plan approximately 50% of the time. Melissa Riggs states she is doing 0 minutes 0 times per week.  Today's visit was #: 10 Starting weight: 284 lbs Starting date: 06/17/2020 Today's weight: 272 lbs Today's date: 06/12/2021 Total lbs lost to date: 12 lbs Total lbs lost since last in-office visit: 0  Interim History: Melissa Riggs is up 10 lbs since her last visit.   Subjective:   1. Type 2 diabetes mellitus with other specified complication, without long-term current use of insulin (Melissa Riggs) Melissa Riggs stopped taking Metformin due to nausea. She could not afford Mounjaro. We are working on getting a coupon. Her Type 2 diabetes mellitus is poorly controlled.   2. Elevated liver enzymes Melissa Riggs denies abdominal pains.   Assessment/Plan:   1. Type 2 diabetes mellitus with other specified complication, without long-term current use of insulin (HCC) We will refill Metformin 750 mg for 1 month with no refills. Good blood sugar control is important to decrease the likelihood of diabetic complications such as nephropathy, neuropathy, limb loss, blindness, coronary artery disease, and death. Intensive lifestyle modification including diet, exercise and weight loss are the first line of treatment for diabetes.   - metFORMIN (GLUCOPHAGE XR) 750 MG 24 hr tablet; Take 1 tablet (750 mg total) by mouth daily with breakfast.  Dispense: 30 tablet; Refill: 0  2. Elevated liver enzymes We will continue to monitor Melissa Riggs's elevated liver enzymes. We will refill Amaryl 2 mg for 1 month with no refills. We discussed the likely diagnosis of non-alcoholic fatty liver disease today and how this condition is obesity related. Melissa Riggs was educated the importance of weight loss. Melissa Riggs agreed to continue  with her weight loss efforts with healthier diet and exercise as an essential part of her treatment plan.   - glimepiride (AMARYL) 2 MG tablet; Take 1 tablet (2 mg total) by mouth daily before breakfast.  Dispense: 30 tablet; Refill: 0  3. Obesity, current BMI of 48.2 Melissa Riggs is currently in the action stage of change. As such, her goal is to continue with weight loss efforts. She has agreed to the Category 2 Plan.   Melissa Riggs will continue meal planning and she will adhere to the plan.  Exercise goals: No exercise has been prescribed at this time.  Behavioral modification strategies: increasing lean protein intake, decreasing simple carbohydrates, increasing vegetables, increasing water intake, decreasing eating out, no skipping meals, meal planning and cooking strategies, keeping healthy foods in the home, and planning for success.  Melissa Riggs has agreed to follow-up with our clinic in 2-3 weeks (fasting) . She was informed of the importance of frequent follow-up visits to maximize her success with intensive lifestyle modifications for her multiple health conditions.   Objective:   Blood pressure 131/87, pulse 75, temperature 98.1 F (36.7 C), height 5\' 3"  (1.6 m), weight 272 lb (123.4 kg), SpO2 96 %. Body mass index is 48.18 kg/m.  General: Cooperative, alert, well developed, in no acute distress. HEENT: Conjunctivae and lids unremarkable. Cardiovascular: Regular rhythm.  Lungs: Normal work of breathing. Neurologic: No focal deficits.   Lab Results  Component Value Date   CREATININE 0.58 02/27/2021   BUN 7 02/27/2021   NA 135 02/27/2021   K 4.1 02/27/2021   CL 97 02/27/2021   CO2 20  02/27/2021   Lab Results  Component Value Date   ALT 79 (H) 02/27/2021   AST 37 02/27/2021   ALKPHOS 99 02/27/2021   BILITOT 0.5 02/27/2021   Lab Results  Component Value Date   HGBA1C 11.5 (H) 02/27/2021   HGBA1C 11.4 (H) 06/17/2020   HGBA1C 5.9 03/02/2018   Lab Results  Component Value  Date   INSULIN 32.9 (H) 02/27/2021   INSULIN 54.7 (H) 06/17/2020   Lab Results  Component Value Date   TSH 1.540 06/17/2020   Lab Results  Component Value Date   CHOL 237 (H) 02/27/2021   HDL 44 02/27/2021   LDLCALC 156 (H) 02/27/2021   TRIG 200 (H) 02/27/2021   Lab Results  Component Value Date   VD25OH 15.3 (L) 02/27/2021   VD25OH 5.6 (L) 06/17/2020   Lab Results  Component Value Date   WBC 9.8 02/26/2021   HGB 15.2 (H) 02/26/2021   HCT 43.0 02/26/2021   MCV 80.8 02/26/2021   PLT 297 02/26/2021   No results found for: IRON, TIBC, FERRITIN  Attestation Statements:   Reviewed by clinician on day of visit: allergies, medications, problem list, medical history, surgical history, family history, social history, and previous encounter notes.  I, Lizbeth Bark, RMA, am acting as Location manager for [employer], DO.  I have reviewed the above documentation for accuracy and completeness, and I agree with the above. Jearld Lesch, DO

## 2021-06-17 ENCOUNTER — Encounter (INDEPENDENT_AMBULATORY_CARE_PROVIDER_SITE_OTHER): Payer: Self-pay | Admitting: Bariatrics

## 2021-07-10 ENCOUNTER — Ambulatory Visit (INDEPENDENT_AMBULATORY_CARE_PROVIDER_SITE_OTHER): Payer: PRIVATE HEALTH INSURANCE | Admitting: Bariatrics

## 2021-10-15 ENCOUNTER — Ambulatory Visit (INDEPENDENT_AMBULATORY_CARE_PROVIDER_SITE_OTHER): Payer: PRIVATE HEALTH INSURANCE | Admitting: Nurse Practitioner

## 2021-10-15 ENCOUNTER — Encounter (INDEPENDENT_AMBULATORY_CARE_PROVIDER_SITE_OTHER): Payer: Self-pay | Admitting: Nurse Practitioner

## 2021-10-15 VITALS — BP 120/81 | HR 88 | Temp 97.9°F | Ht 63.0 in | Wt 273.0 lb

## 2021-10-15 DIAGNOSIS — E669 Obesity, unspecified: Secondary | ICD-10-CM

## 2021-10-15 DIAGNOSIS — E785 Hyperlipidemia, unspecified: Secondary | ICD-10-CM | POA: Diagnosis not present

## 2021-10-15 DIAGNOSIS — E1169 Type 2 diabetes mellitus with other specified complication: Secondary | ICD-10-CM | POA: Diagnosis not present

## 2021-10-15 DIAGNOSIS — R748 Abnormal levels of other serum enzymes: Secondary | ICD-10-CM | POA: Diagnosis not present

## 2021-10-15 DIAGNOSIS — E559 Vitamin D deficiency, unspecified: Secondary | ICD-10-CM

## 2021-10-15 DIAGNOSIS — Z6841 Body Mass Index (BMI) 40.0 and over, adult: Secondary | ICD-10-CM

## 2021-10-15 DIAGNOSIS — Z7984 Long term (current) use of oral hypoglycemic drugs: Secondary | ICD-10-CM

## 2021-10-15 MED ORDER — METFORMIN HCL ER 750 MG PO TB24: 750.0000 mg | ORAL_TABLET | Freq: Every day | ORAL | 0 refills | Status: DC

## 2021-10-15 MED ORDER — TIRZEPATIDE 2.5 MG/0.5ML ~~LOC~~ SOAJ: 2.5000 mg | SUBCUTANEOUS | 0 refills | Status: DC

## 2021-10-15 MED ORDER — GLIMEPIRIDE 2 MG PO TABS: 2.0000 mg | ORAL_TABLET | Freq: Every day | ORAL | 0 refills | Status: DC

## 2021-10-16 ENCOUNTER — Ambulatory Visit: Payer: PRIVATE HEALTH INSURANCE | Admitting: Obstetrics and Gynecology

## 2021-10-16 ENCOUNTER — Encounter: Payer: Self-pay | Admitting: Obstetrics and Gynecology

## 2021-10-16 VITALS — BP 130/82 | HR 82 | Ht 63.0 in | Wt 273.0 lb

## 2021-10-16 DIAGNOSIS — N760 Acute vaginitis: Secondary | ICD-10-CM

## 2021-10-16 DIAGNOSIS — B3731 Acute candidiasis of vulva and vagina: Secondary | ICD-10-CM | POA: Diagnosis not present

## 2021-10-16 LAB — WET PREP FOR TRICH, YEAST, CLUE

## 2021-10-16 MED ORDER — BETAMETHASONE VALERATE 0.1 % EX OINT: 1.0000 "application " | TOPICAL_OINTMENT | Freq: Two times a day (BID) | CUTANEOUS | 0 refills | Status: DC

## 2021-10-16 MED ORDER — FLUCONAZOLE 150 MG PO TABS: 150.0000 mg | ORAL_TABLET | Freq: Once | ORAL | 0 refills | Status: AC

## 2021-10-16 NOTE — Progress Notes (Signed)
GYNECOLOGY  VISIT ?  ?HPI: ?26 y.o.   Married   ?Other or two or more races ?Unavailable Hispanic or Latino  female   ?[id number] with No LMP recorded. (Menstrual status: IUD).   ?here for  vaginal itching and burning. She has been using betamethasone ointment, ran out.  ?She has DM, when her sugar is elevated she gets yeast infections. ?Current symptoms started ~1 month ago. ? ? ?GYNECOLOGIC HISTORY: ?No LMP recorded. (Menstrual status: IUD). ?Contraception:IUD  ?Menopausal hormone therapy: none  ?       ?OB History   ? ? Gravida  ?0  ? Para  ?0  ? Term  ?0  ? Preterm  ?0  ? AB  ?0  ? Living  ?0  ?  ? ? SAB  ?0  ? IAB  ?0  ? Ectopic  ?0  ? Multiple  ?0  ? Live Births  ?0  ?   ?  ?  ?    ? ?Patient Active Problem List  ? Diagnosis Date Noted  ? Diabetes mellitus ([place]) 08/30/2020  ? Vitamin D deficiency 08/30/2020  ? Class 3 severe obesity with serious comorbidity and body mass index (BMI) of 45.0 to 49.9 in adult [facility]) 08/30/2020  ? Morbid obesity ([place]) 06/20/2020  ? Diabetes mellitus type 2 in obese ([place]) 06/18/2020  ? Viral URI 12/23/2018  ? Exposure to COVID-19 virus 11/18/2018  ? Suspected COVID-19 virus infection 11/18/2018  ? Hypothyroidism 03/02/2018  ? Anovulation 03/02/2018  ? ? ?Past Medical History:  ?Diagnosis Date  ? Amenorrhea   ? Asthma   ? Asthma   ? when younger, not current problem  ? Cancer [facility])   ? Complication of anesthesia   ? slow to awaken after tonsils and adenoid surgery yrs ago  ? Diabetes mellitus without complication ([place])   ? Dysmenorrhea   ? Female infertility   ? Hormone disorder   ? PCOS (polycystic ovarian syndrome)   ? Prediabetes   ? Vision abnormalities   ? wears glasses  ? ? ?Past Surgical History:  ?Procedure Laterality Date  ? ADENOIDECTOMY    ? APPENDECTOMY    ? CARPAL TUNNEL RELEASE  12/14/2011  ? Procedure: CARPAL TUNNEL RELEASE;  Surgeon: Tennis Must, MD;  Location: [facility];  Service: Orthopedics;  Laterality: Left;  ? CARPAL TUNNEL RELEASE  01/28/2012  ?  Procedure: CARPAL TUNNEL RELEASE;  Surgeon: Tennis Must, MD;  Location: [place];  Service: Orthopedics;  Laterality: Right;  right carpal tunnel release  ? CARPAL TUNNEL RELEASE Bilateral   ? CATARACT EXTRACTION, BILATERAL    ? DILATATION & CURETTAGE/HYSTEROSCOPY WITH MYOSURE N/A 08/15/2018  ? Procedure: DILATATION & CURETTAGE/HYSTEROSCOPY WITH MYOSURE;  Surgeon: [name], MD;  Location: [facility];  Service: Gynecology;  Laterality: N/A;  endometrial polyp  ? DILATION AND CURETTAGE OF UTERUS  for miscarriage this year  ? TONSILLECTOMY    ? TONSILLECTOMY    ? and adenoid   ? ? ?Current Outpatient Medications  ?Medication Sig Dispense Refill  ? betamethasone dipropionate 0.05 % cream Apply topically 2 (two) times daily.    ? blood glucose meter kit and supplies KIT Use too Check FBS a.m. and 1 hour after evening meal 1 each 0  ? glimepiride (AMARYL) 2 MG tablet Take 1 tablet (2 mg total) by mouth daily before breakfast. 30 tablet 0  ? glucose blood test strip Use as instructed 100 each 0  ?  Lancets 30G MISC Use to check blood sugar bid 100 each 0  ? levonorgestrel (MIRENA) 20 MCG/DAY IUD 1 each by Intrauterine route once.    ? metFORMIN (GLUCOPHAGE XR) 750 MG 24 hr tablet Take 1 tablet (750 mg total) by mouth daily with breakfast. 30 tablet 0  ? levonorgestrel (MIRENA) 20 MCG/24HR IUD 1 Intra Uterine Device (1 each total) by Intrauterine route once for 1 dose. To treat endometrial cancer, fertility sparing 1 each 0  ? ?No current facility-administered medications for this visit.  ?  ? ?ALLERGIES: Betadine [povidone iodine], Amoxicillin, Amoxicillin, and Penicillins ? ?Family History  ?Problem Relation Age of Onset  ? Depression Brother   ? Drug abuse Brother   ? Heart disease Maternal Grandmother   ? Hypertension Maternal Grandmother   ? Hypertension Paternal Grandmother   ? Depression Paternal Grandmother   ? Diabetes Mother   ? Obesity Mother   ? High Cholesterol Father    ? Alcohol abuse Father   ? ? ?Social History  ? ?Socioeconomic History  ? Marital status: Married  ?  Spouse name: Cory Roughen  ? Number of children: Not on file  ? Years of education: Not on file  ? Highest education level: Not on file  ?Occupational History  ? Occupation: Passenger transport manager  ?Tobacco Use  ? Smoking status: Never  ? Smokeless tobacco: Never  ?Vaping Use  ? Vaping Use: Never used  ?Substance and Sexual Activity  ? Alcohol use: No  ? Drug use: No  ? Sexual activity: Yes  ?  Birth control/protection: I.U.D.  ?  Comment: had miscarriage may 2013  ?Other Topics Concern  ? Not on file  ?Social History Narrative  ? ** Merged History Encounter **  ?    ? ?Social Determinants of Health  ? ?Financial Resource Strain: Not on file  ?Food Insecurity: Not on file  ?Transportation Needs: Not on file  ?Physical Activity: Not on file  ?Stress: Not on file  ?Social Connections: Not on file  ?Intimate Partner Violence: Not on file  ? ? ?Review of Systems  ?All other systems reviewed and are negative. ? ?PHYSICAL EXAMINATION:   ? ?BP 130/82   Pulse 82   Ht $R'5\' 3"'eE$  (1.6 m)   Wt 273 lb (123.8 kg)   SpO2 100%   BMI 48.36 kg/m?     ?General appearance: alert, cooperative and appears stated age ? ? ?Pelvic: External genitalia:  no lesions, +erythema, mild whitening ?             Urethra:  normal appearing urethra with no masses, tenderness or lesions ?             Bartholins and Skenes: normal    ?             Vagina: erythematous appearing vagina with a slight increase in thin and thick vaginal discharge ?             Cervix: no lesions and IUD string 3-4 cm ?              ? ?Chaperone was present for exam. ? ?1. Vulvovaginitis ?- WET PREP FOR TRICH, YEAST, CLUE ?- betamethasone valerate ointment (VALISONE) 0.1 %; Apply 1 application. topically 2 (two) times daily. Use for up to 2 weeks as needed  Dispense: 30 g; Refill: 0 ? ? ?2. Yeast vaginitis ?She is prone to infections when her DM is out of control. Severe infection.  ?-  fluconazole (DIFLUCAN) 150  MG tablet; Take 1 tablet (150 mg total) by mouth once for 1 dose. Repeat every 72 hours as needed for persistent symptoms.  Dispense: 3 tablet; Refill: 0 ? ? ?

## 2021-10-16 NOTE — Patient Instructions (Signed)

## 2021-10-21 NOTE — Progress Notes (Signed)
? ? ? ?Chief Complaint:  ? ?OBESITY ?Melissa Riggs is here to discuss her progress with her obesity treatment plan along with follow-up of her obesity related diagnoses. Melissa Riggs is on the Category 2 Plan and states she is following her eating plan approximately 0% of the time. Melissa Riggs states she is doing 0 minutes 0 times per week. ? ?Today's visit was #: 81 ?Starting weight: 284 lbs ?Starting date: 06/17/2020 ?Today's weight: 273 lbs ?Today's date: 10/15/2021 ?Total lbs lost to date: 11 lbs ?Total lbs lost since last in-office visit: 0 ? ?Interim History: Melissa Riggs was seen here last on 06/12/2021. Since her last visit she has been in [place] for 1.5 months caring for a family member and attending a funeral. She is eating 2 meals per day. She is "not a breakfast person". She is drinking water, diet sodas, and coffee.  ? ?Subjective:  ? ?1. Type 2 diabetes mellitus with other specified complication, without long-term current use of insulin ([place]) ?Melissa Riggs's last A1C was 11.5. She is taking Metformin SR 750 mg. She is not taking it on a regular basis due to traveling to [place]. She was taking [place] and doing well but had to stop due to cost after changing insurance. She has not been on Amaryl for the past 3 months. When she checks her blood sugars they range 204-229. She is not on a statin or ACE. She has a diabetic eye exam scheduled for Friday.  ? ?2. Hyperlipidemia associated with type 2 diabetes mellitus (Melissa Riggs) ?Melissa Riggs's last lipids was on 02/27/2021. She has never been on statin.   ? ?3. Elevated liver enzymes ?Melissa Riggs denies abdominal pain.  ? ?4. Vitamin D deficiency ?Melissa Riggs's last Vitamin D level was 15.3. She is not taking Vitamin D.  ? ?Assessment/Plan:  ? ?1. Type 2 diabetes mellitus with other specified complication, without long-term current use of insulin (Melissa Riggs) ?Melissa Riggs will return for fasting labs. We will refill Metformin XR 750 mg for 1 month with no refills. We will refill Amaryl 2 mg for 1 month with 1  refills. We discussed the need to be seen on a regular basis to managed diabetes mellitus, hyperlipidemia, and elevated liver enzymes. Good blood sugar control is important to decrease the likelihood of diabetic complications such as nephropathy, neuropathy, limb loss, blindness, coronary artery disease, and death. Intensive lifestyle modification including diet, exercise and weight loss are the first line of treatment for diabetes.  ? ?To return for fasting labs ?Refill Metformin XR '750mg'$ .  Side effects discussed ?Refill Amaryl '2mg'$ .  Side effects discussed.  ?Discussed need to be seen on a regular basis to manage DM, HLD, elevated liver enzymes.  Risks associated with uncontrolled DMT2 discussed extensively with the patient such as but not limited to eye damage, kidney damage, MI and CVA.  Patient verbalizes understanding.   ? ?- metFORMIN (GLUCOPHAGE XR) 750 MG 24 hr tablet; Take 1 tablet (750 mg total) by mouth daily with breakfast.  Dispense: 30 tablet; Refill: 0 ? ?- glimepiride (AMARYL) 2 MG tablet; Take 1 tablet (2 mg total) by mouth daily before breakfast.  Dispense: 30 tablet; Refill: 0 ? ?2. Hyperlipidemia associated with type 2 diabetes mellitus (Melissa Riggs) ?Cardiovascular risk and specific lipid/LDL goals reviewed.  Aahna will return for fasting labs. We discussed several lifestyle modifications today and Muslima will continue to work on diet, exercise and weight loss efforts. Orders and follow up as documented in patient record.  ? ?Counseling ?Intensive lifestyle modifications are the first line treatment for this  issue. ?Dietary changes: Increase soluble fiber. Decrease simple carbohydrates. ?Exercise changes: Moderate to vigorous-intensity aerobic activity 150 minutes per week if tolerated. ?Lipid-lowering medications: see documented in medical record. ? ?3. Elevated liver enzymes ?We discussed the likely diagnosis of non-alcoholic fatty liver disease today and how this condition is obesity related.  Melissa Riggs will return for labs. Melissa Riggs was educated the importance of weight loss. Melissa Riggs agreed to continue with her weight loss efforts with healthier diet and exercise as an essential part of her treatment plan.  ? ? ?4. Vitamin D deficiency ?Low Vitamin D level contributes to fatigue and are associated with obesity, breast, and colon cancer. Melissa Riggs will return for labs. She  will follow-up for routine testing of Vitamin D, at least 2-3 times per year to avoid over-replacement. ? ?5. Obesity, current BMI of 48.5 ?Melissa Riggs is currently in the action stage of change. As such, her goal is to continue with weight loss efforts. She has agreed to the Category 2 Plan.  ? ?Melissa Riggs will start [employer] for breakfast.  ? ?Exercise goals:  Melissa Riggs will start walking. ? ?Behavioral modification strategies: increasing lean protein intake, increasing water intake, no skipping meals, and meal planning and cooking strategies. ? ?Melissa Riggs has agreed to follow-up with our clinic in 2 weeks. She was informed of the importance of frequent follow-up visits to maximize her success with intensive lifestyle modifications for her multiple health conditions.  ? ?Objective:  ? ?Blood pressure 120/81, pulse 88, temperature 97.9 ?F (36.6 ?C), height '5\' 3"'$  (1.6 m), weight 273 lb (123.8 kg), SpO2 98 %. ?Body mass index is 48.36 kg/m?. ? ?General: Cooperative, alert, well developed, in no acute distress. ?HEENT: Conjunctivae and lids unremarkable. ?Cardiovascular: Regular rhythm.  ?Lungs: Normal work of breathing. ?Neurologic: No focal deficits.  ? ?Lab Results  ?Component Value Date  ? CREATININE 0.58 02/27/2021  ? BUN 7 02/27/2021  ? NA 135 02/27/2021  ? K 4.1 02/27/2021  ? CL 97 02/27/2021  ? CO2 20 02/27/2021  ? ?Lab Results  ?Component Value Date  ? ALT 79 (H) 02/27/2021  ? AST 37 02/27/2021  ? ALKPHOS 99 02/27/2021  ? BILITOT 0.5 02/27/2021  ? ?Lab Results  ?Component Value Date  ? HGBA1C 11.5 (H) 02/27/2021  ? HGBA1C 11.4 (H) 06/17/2020  ?  HGBA1C 5.9 03/02/2018  ? ?Lab Results  ?Component Value Date  ? INSULIN 32.9 (H) 02/27/2021  ? INSULIN 54.7 (H) 06/17/2020  ? ?Lab Results  ?Component Value Date  ? TSH 1.540 06/17/2020  ? ?Lab Results  ?Component Value Date  ? CHOL 237 (H) 02/27/2021  ? HDL 44 02/27/2021  ? LDLCALC 156 (H) 02/27/2021  ? TRIG 200 (H) 02/27/2021  ? ?Lab Results  ?Component Value Date  ? VD25OH 15.3 (L) 02/27/2021  ? VD25OH 5.6 (L) 06/17/2020  ? ?Lab Results  ?Component Value Date  ? WBC 9.8 02/26/2021  ? HGB 15.2 (H) 02/26/2021  ? HCT 43.0 02/26/2021  ? MCV 80.8 02/26/2021  ? PLT 297 02/26/2021  ? ?No results found for: IRON, TIBC, FERRITIN ? ?Attestation Statements:  ? ?Reviewed by clinician on day of visit: allergies, medications, problem list, medical history, surgical history, family history, social history, and previous encounter notes. ? ?Time spent on visit including pre-visit chart review and post-visit care and charting was 30 minutes.  ? ?I, Lizbeth Bark, RMA, am acting as Location manager for Everardo Pacific, FNP. ? ?I have reviewed the above documentation for accuracy and completeness, and I agree with the above. -  Everardo Pacific, FNP  ?

## 2021-10-27 ENCOUNTER — Ambulatory Visit (INDEPENDENT_AMBULATORY_CARE_PROVIDER_SITE_OTHER): Payer: PRIVATE HEALTH INSURANCE | Admitting: Nurse Practitioner

## 2021-10-27 ENCOUNTER — Encounter (INDEPENDENT_AMBULATORY_CARE_PROVIDER_SITE_OTHER): Payer: Self-pay | Admitting: Nurse Practitioner

## 2021-10-27 VITALS — BP 112/74 | HR 70 | Temp 98.0°F | Ht 63.0 in | Wt 270.0 lb

## 2021-10-27 DIAGNOSIS — E785 Hyperlipidemia, unspecified: Secondary | ICD-10-CM

## 2021-10-27 DIAGNOSIS — E1169 Type 2 diabetes mellitus with other specified complication: Secondary | ICD-10-CM | POA: Diagnosis not present

## 2021-10-27 DIAGNOSIS — E669 Obesity, unspecified: Secondary | ICD-10-CM

## 2021-10-27 DIAGNOSIS — Z6841 Body Mass Index (BMI) 40.0 and over, adult: Secondary | ICD-10-CM | POA: Diagnosis not present

## 2021-10-27 DIAGNOSIS — Z7984 Long term (current) use of oral hypoglycemic drugs: Secondary | ICD-10-CM

## 2021-10-27 DIAGNOSIS — E66813 Obesity, class 3: Secondary | ICD-10-CM

## 2021-10-28 ENCOUNTER — Encounter (HOSPITAL_COMMUNITY): Payer: Self-pay | Admitting: Emergency Medicine

## 2021-10-28 ENCOUNTER — Emergency Department (HOSPITAL_COMMUNITY)
Admission: EM | Admit: 2021-10-28 | Discharge: 2021-10-29 | Disposition: A | Discharge status: Home / Self Care | Payer: PRIVATE HEALTH INSURANCE | Attending: Emergency Medicine | Admitting: Emergency Medicine

## 2021-10-28 ENCOUNTER — Other Ambulatory Visit: Payer: Self-pay

## 2021-10-28 DIAGNOSIS — Z8542 Personal history of malignant neoplasm of other parts of uterus: Secondary | ICD-10-CM | POA: Insufficient documentation

## 2021-10-28 DIAGNOSIS — R600 Localized edema: Secondary | ICD-10-CM | POA: Diagnosis not present

## 2021-10-28 DIAGNOSIS — E119 Type 2 diabetes mellitus without complications: Secondary | ICD-10-CM | POA: Insufficient documentation

## 2021-10-28 DIAGNOSIS — M79605 Pain in left leg: Secondary | ICD-10-CM | POA: Insufficient documentation

## 2021-10-28 DIAGNOSIS — Z7984 Long term (current) use of oral hypoglycemic drugs: Secondary | ICD-10-CM | POA: Insufficient documentation

## 2021-10-28 LAB — LIPID PANEL WITH LDL/HDL RATIO
Cholesterol, Total: 187 mg/dL (ref 100–199)
HDL: 32 mg/dL — ABNORMAL LOW (ref >39)
LDL Chol Calc (NIH): 115 mg/dL — ABNORMAL HIGH (ref 0–99)
LDL/HDL Ratio: 3.6 ratio — ABNORMAL HIGH (ref 0.0–3.2)
Triglycerides: 227 mg/dL — ABNORMAL HIGH (ref 0–149)
VLDL Cholesterol Cal: 40 mg/dL (ref 5–40)

## 2021-10-28 LAB — COMPREHENSIVE METABOLIC PANEL
ALT: 52 IU/L — ABNORMAL HIGH (ref 0–32)
AST: 26 IU/L (ref 0–40)
Albumin/Globulin Ratio: 1.8 (ref 1.2–2.2)
Albumin: 4.3 g/dL (ref 3.9–5.0)
Alkaline Phosphatase: 69 IU/L (ref 44–121)
BUN/Creatinine Ratio: 18 (ref 9–23)
BUN: 10 mg/dL (ref 6–20)
Bilirubin Total: <0.2 mg/dL (ref 0.0–1.2)
CO2: 21 mmol/L (ref 20–29)
Calcium: 9.2 mg/dL (ref 8.7–10.2)
Chloride: 104 mmol/L (ref 96–106)
Creatinine, Ser: 0.56 mg/dL — ABNORMAL LOW (ref 0.57–1.00)
Globulin, Total: 2.4 g/dL (ref 1.5–4.5)
Glucose: 160 mg/dL — ABNORMAL HIGH (ref 70–99)
Potassium: 4.6 mmol/L (ref 3.5–5.2)
Sodium: 140 mmol/L (ref 134–144)
Total Protein: 6.7 g/dL (ref 6.0–8.5)
eGFR: 128 mL/min/{1.73_m2} (ref >59)

## 2021-10-28 LAB — HEMOGLOBIN A1C
Est. average glucose Bld gHb Est-mCnc: 212 mg/dL
Hgb A1c MFr Bld: 9 % — ABNORMAL HIGH (ref 4.8–5.6)

## 2021-10-28 LAB — INSULIN, RANDOM: INSULIN: 97.1 u[IU]/mL — ABNORMAL HIGH (ref 2.6–24.9)

## 2021-10-28 NOTE — Progress Notes (Unsigned)
Chief Complaint:   Delta is here to discuss her progress with her obesity treatment plan along with follow-up of her obesity related diagnoses. [place] is on the Category 2 Plan and states she is following her eating plan approximately 70% of the time. Kadance states she is doing 0 minutes 0 times per week.  Today's visit was #: 12 Starting weight: 284 lbs Starting date: 06/17/2020 Today's weight: 270 lbs Today's date: 10/27/2021 Total lbs lost to date: 14 lbs Total lbs lost since last in-office visit: 3 lbs  Interim History: Emerly has done well with weight loss since her last visit. She is leaving for [place] Sunday for 2 weeks. She is not skipping meals. In [place], she is staying with her family and doesn't plan to eat out much. She struggles with dinner and eating out.   Subjective:   1. Type 2 diabetes mellitus with other specified complication, without long-term current use of insulin ([place]) Harrison's last A1C was 11.5. She is taking Amaryl 2 mg and Glucophage XR 750 mg. She denies side effects. Her fasting blood sugar was in the range of 94-170. She has taken Mounjaro and Victoza in the past. She stopped Mounjaro due to cost. She denies side effects while taking Victoza.   2. Hyperlipidemia associated with type 2 diabetes mellitus ([place]) Delena has never been on medications.   Assessment/Plan:   1. Type 2 diabetes mellitus with other specified complication, without long-term current use of insulin ([place]) [place] will continue Metformin and Amaryl. She will consider restarting Victoza at next office visit based upon A1C results. Labs were obtained today. Good blood sugar control is important to decrease the likelihood of diabetic complications such as nephropathy, neuropathy, limb loss, blindness, coronary artery disease, and death. Intensive lifestyle modification including diet, exercise and weight loss are the first line of treatment for diabetes.   - Comprehensive  metabolic panel - Hemoglobin A1c - Insulin, random  2. Hyperlipidemia associated with type 2 diabetes mellitus ([place]) Cardiovascular risk and specific lipid/LDL goals reviewed.  We discussed several lifestyle modifications today and Marlaine will continue to work on diet, exercise and weight loss efforts. Orders and follow up as documented in patient record. We obtained labs today.   Counseling Intensive lifestyle modifications are the first line treatment for this issue. Dietary changes: Increase soluble fiber. Decrease simple carbohydrates. Exercise changes: Moderate to vigorous-intensity aerobic activity 150 minutes per week if tolerated. Lipid-lowering medications: see documented in medical record.  - Comprehensive metabolic panel - Lipid Panel With LDL/HDL Ratio  3. Obesity, current BMI of 47.9 Jearlean is currently in the action stage of change. As such, her goal is to continue with weight loss efforts. She has agreed to practicing portion control and making smarter food choices, such as increasing vegetables and decreasing simple carbohydrates while in [place].   Exercise goals:  Niesha will start walking.   Behavioral modification strategies: increasing lean protein intake, increasing water intake, and no skipping meals.  Lyndia has agreed to follow-up with our clinic in 3 weeks. She was informed of the importance of frequent follow-up visits to maximize her success with intensive lifestyle modifications for her multiple health conditions.   Objective:   Blood pressure 112/74, pulse 70, temperature 98 F (36.7 C), height '5\' 3"'$  (1.6 m), weight 270 lb (122.5 kg), SpO2 98 %. Body mass index is 47.83 kg/m.  General: Cooperative, alert, well developed, in no acute distress. HEENT: Conjunctivae and lids unremarkable. Cardiovascular: Regular rhythm.  Lungs: Normal work of breathing. Neurologic: No focal deficits.   Lab Results  Component Value Date   CREATININE 0.56 (L)  10/27/2021   BUN 10 10/27/2021   NA 140 10/27/2021   K 4.6 10/27/2021   CL 104 10/27/2021   CO2 21 10/27/2021   Lab Results  Component Value Date   ALT 52 (H) 10/27/2021   AST 26 10/27/2021   ALKPHOS 69 10/27/2021   BILITOT <0.2 10/27/2021   Lab Results  Component Value Date   HGBA1C 9.0 (H) 10/27/2021   HGBA1C 11.5 (H) 02/27/2021   HGBA1C 11.4 (H) 06/17/2020   HGBA1C 5.9 03/02/2018   Lab Results  Component Value Date   INSULIN 97.1 (H) 10/27/2021   INSULIN 32.9 (H) 02/27/2021   INSULIN 54.7 (H) 06/17/2020   Lab Results  Component Value Date   TSH 1.540 06/17/2020   Lab Results  Component Value Date   CHOL 187 10/27/2021   HDL 32 (L) 10/27/2021   LDLCALC 115 (H) 10/27/2021   TRIG 227 (H) 10/27/2021   Lab Results  Component Value Date   VD25OH 15.3 (L) 02/27/2021   VD25OH 5.6 (L) 06/17/2020   Lab Results  Component Value Date   WBC 9.8 02/26/2021   HGB 15.2 (H) 02/26/2021   HCT 43.0 02/26/2021   MCV 80.8 02/26/2021   PLT 297 02/26/2021   No results found for: IRON, TIBC, FERRITIN  Attestation Statements:   Reviewed by clinician on day of visit: allergies, medications, problem list, medical history, surgical history, family history, social history, and previous encounter notes.  Time spent on visit including pre-visit chart review and post-visit care and charting was 30 minutes.   I, Lizbeth Bark, RMA, am acting as Location manager for Everardo Pacific, FNP.   I have reviewed the above documentation for accuracy and completeness, and I agree with the above. -  ***

## 2021-10-28 NOTE — ED Triage Notes (Signed)
Patient c/o left shin pain since Sunday.  Patient states pain worsens with ambulation and palpation of area.  Patient denies any recent travel. Patient denies any trauma.

## 2021-10-29 ENCOUNTER — Ambulatory Visit (HOSPITAL_BASED_OUTPATIENT_CLINIC_OR_DEPARTMENT_OTHER)
Admission: RE | Admit: 2021-10-29 | Discharge: 2021-10-29 | Disposition: A | Discharge status: Home / Self Care | Payer: PRIVATE HEALTH INSURANCE | Source: Ambulatory Visit | Attending: Emergency Medicine | Admitting: Emergency Medicine

## 2021-10-29 DIAGNOSIS — R609 Edema, unspecified: Secondary | ICD-10-CM | POA: Diagnosis not present

## 2021-10-29 DIAGNOSIS — R600 Localized edema: Secondary | ICD-10-CM | POA: Insufficient documentation

## 2021-10-29 NOTE — ED Provider Notes (Signed)
[facility] EMERGENCY DEPARTMENT Provider Note   CSN: [id number] Arrival date & time: 10/28/21  2211     History  Chief Complaint  Patient presents with   Leg Pain    Melissa Riggs is a 26 y.o. female.  The history is provided by the patient and medical records.  Leg Pain  26 y.o. F with hx of DM, presenting to the ED with left leg pain.  States it started Sunday and has worsened since then.  States pain in left shin and calf, worse with walking. States it feels "tight" and "sore" when walking.  Denies any fever/chills.  No numbness/tingling of the legs.  She denies hx of DVT or PE but is concerned for such.  She does have hx of endometrial cancer and has mirena in place.  States she is scheduled to travel this weekend and wanted to be checked prior to this.  Home Medications Prior to Admission medications   Medication Sig Start Date End Date Taking? Authorizing Provider  betamethasone valerate ointment (VALISONE) 0.1 % Apply 1 application. topically 2 (two) times daily. Use for up to 2 weeks as needed 10/16/21   [name], MD  blood glucose meter kit and supplies KIT Use too Check FBS a.m. and 1 hour after evening meal 06/19/20   Jearld Lesch A, DO  glimepiride (AMARYL) 2 MG tablet Take 1 tablet (2 mg total) by mouth daily before breakfast. 10/15/21   Everardo Pacific, FNP  glucose blood test strip Use as instructed 04/03/21   Jearld Lesch A, DO  Lancets 30G MISC Use to check blood sugar bid 06/19/20   Georgia Lopes, DO  levonorgestrel (MIRENA) 20 MCG/24HR IUD 1 Intra Uterine Device (1 each total) by Intrauterine route once for 1 dose. To treat endometrial cancer, fertility sparing 08/23/18 03/29/20  Joylene John D, NP  levonorgestrel (MIRENA) 20 MCG/DAY IUD 1 each by Intrauterine route once.    [provider]  metFORMIN (GLUCOPHAGE XR) 750 MG 24 hr tablet Take 1 tablet (750 mg total) by mouth daily with breakfast. 10/15/21    Everardo Pacific, FNP      Allergies    Betadine [povidone iodine], Amoxicillin, Amoxicillin, and Penicillins    Review of Systems   Review of Systems  Musculoskeletal:  Positive for arthralgias.  All other systems reviewed and are negative.  Physical Exam Updated Vital Signs BP (!) 141/90 (BP Location: Left Arm)   Pulse (!) 105   Temp 98.9 F (37.2 C) (Oral)   Resp 20   SpO2 98%   Physical Exam Vitals and nursing note reviewed.  Constitutional:      Appearance: She is well-developed.  HENT:     Head: Normocephalic and atraumatic.  Eyes:     Conjunctiva/sclera: Conjunctivae normal.     Pupils: Pupils are equal, round, and reactive to light.  Cardiovascular:     Rate and Rhythm: Normal rate and regular rhythm.     Heart sounds: Normal heart sounds.  Pulmonary:     Effort: Pulmonary effort is normal.     Breath sounds: Normal breath sounds.  Abdominal:     General: Bowel sounds are normal.     Palpations: Abdomen is soft.  Musculoskeletal:        General: Normal range of motion.     Cervical back: Normal range of motion.     Comments: Both legs with venous stasis changes to the lower extremities and ankle, there is no significant calf  asymmetry, tenderness along left lower/mid shin and into the calf, no palpable cord, DP pulses intact BLE  Skin:    General: Skin is warm and dry.  Neurological:     Mental Status: She is alert and oriented to person, place, and time.    ED Results / Procedures / Treatments   Labs (all labs ordered are listed, but only abnormal results are displayed) Labs Reviewed - No data to display  EKG None  Radiology No results found.  Procedures Procedures    Medications Ordered in ED Medications - No data to display  ED Course/ Medical Decision Making/ A&P                           Medical Decision Making  26 y.o. F here with left leg pain.  She has concern for DVT.  No history of such but does have history of endometrial  cancer and has Mirena in place.  Left leg without significant calf asymmetry or palpable cord.  Her leg is neurovascularly intact.  Does have some venous stasis changes but no overlying erythema or induration to suggest cellulitis.  She is diabetic without any open wounds or other source of infection to the leg.  Unable to get venous ultrasound at this hour so will schedule to be done in the morning.  She had labs done yesterday with PCP-- reviewed without acute findings, stable SrCr so will not repeat.   She will return here in the morning for venous US.  She is aware if any findings of DVT or other concerning findings, she will be directed back to the ED, otherwise can follow-up with PCP.  Return here for new concerns.   Final Clinical Impression(s) / ED Diagnoses Final diagnoses:  Left leg pain    Rx / DC Orders ED Discharge Orders          Ordered    LE VENOUS        10/29/21 0243              Larene Pickett, PA-C 38/36/54 [id number]    Delora Fuel, MD 66/48/30 [phone]

## 2021-10-29 NOTE — ED Notes (Signed)
Pt verbalized understanding of d/c instructions, meds, and followup care. Denies questions. VSS, no distress noted. Steady gait to exit with all belongings.  ?

## 2021-10-29 NOTE — Progress Notes (Signed)
Left lower extremity venous duplex completed. Refer to "CV Proc" under chart review to view preliminary results.  10/29/2021 11:35 AM Kelby Aline., MHA, RVT, RDCS, RDMS

## 2021-10-29 NOTE — ED Notes (Signed)
Left leg warm to touch and red. Soreness with foot extension. Hx blood clot in family. Dorsal pedal pulses equal bilaterally.

## 2021-11-18 ENCOUNTER — Ambulatory Visit (INDEPENDENT_AMBULATORY_CARE_PROVIDER_SITE_OTHER): Payer: PRIVATE HEALTH INSURANCE | Admitting: Nurse Practitioner

## 2022-01-14 ENCOUNTER — Encounter (INDEPENDENT_AMBULATORY_CARE_PROVIDER_SITE_OTHER): Payer: Self-pay

## 2022-02-12 ENCOUNTER — Ambulatory Visit (INDEPENDENT_AMBULATORY_CARE_PROVIDER_SITE_OTHER): Payer: PRIVATE HEALTH INSURANCE | Admitting: Nurse Practitioner

## 2022-02-12 ENCOUNTER — Encounter (INDEPENDENT_AMBULATORY_CARE_PROVIDER_SITE_OTHER): Payer: Self-pay | Admitting: Nurse Practitioner

## 2022-02-12 VITALS — BP 123/89 | HR 76 | Temp 98.8°F | Ht 63.0 in | Wt 274.0 lb

## 2022-02-12 DIAGNOSIS — E1169 Type 2 diabetes mellitus with other specified complication: Secondary | ICD-10-CM

## 2022-02-12 DIAGNOSIS — E669 Obesity, unspecified: Secondary | ICD-10-CM

## 2022-02-12 DIAGNOSIS — Z7984 Long term (current) use of oral hypoglycemic drugs: Secondary | ICD-10-CM

## 2022-02-12 DIAGNOSIS — E785 Hyperlipidemia, unspecified: Secondary | ICD-10-CM | POA: Diagnosis not present

## 2022-02-12 DIAGNOSIS — Z7985 Long-term (current) use of injectable non-insulin antidiabetic drugs: Secondary | ICD-10-CM

## 2022-02-12 DIAGNOSIS — Z6841 Body Mass Index (BMI) 40.0 and over, adult: Secondary | ICD-10-CM

## 2022-02-12 MED ORDER — LANCETS 30G MISC: 0 refills | Status: DC

## 2022-02-12 MED ORDER — TIRZEPATIDE 2.5 MG/0.5ML ~~LOC~~ SOAJ
2.5000 mg | SUBCUTANEOUS | 0 refills | Status: DC
Start: 2022-02-12 — End: 2022-02-24

## 2022-02-12 MED ORDER — GLUCOSE BLOOD VI STRP: ORAL_STRIP | 0 refills | Status: DC

## 2022-02-12 MED ORDER — METFORMIN HCL ER 750 MG PO TB24: 750.0000 mg | ORAL_TABLET | Freq: Every day | ORAL | 0 refills | Status: DC

## 2022-02-16 NOTE — Progress Notes (Unsigned)
Chief Complaint:   Melissa Riggs is here to discuss her progress with her obesity treatment plan along with follow-up of her obesity related diagnoses. Melissa Riggs is on the Category 2 Plan and states she is following her eating plan approximately 0% of the time. Melissa Riggs states she is exercising 0 minutes 0 times per week.  Today's visit was #: 9 Starting weight: 284 lbs Starting date: 06/17/2021 Today's weight: 274 lbs Today's date: 02/12/2022 Total lbs lost to date: 10 lbs Total lbs lost since last in-office visit: 0  Interim History: Melissa Riggs was seen here last on 10/27/21. Since her last visit she went to [facility] for 2.5 months. Not following plan. Notes when she was in [place] she walkied more. The meal plans are hard for her to follow. Would like to follow Melissa Riggs. Drinking water, Vitamin water and diet sodas.  Subjective:   1. Type 2 diabetes mellitus with other specified complication, without long-term current use of insulin (Melissa Riggs) Labs discussed during visit today. Melissa Riggs's last A1c was 9.0. She has not been taken any medications for the past month due to running out while in [place]. Took Mounjaro and Victoza in the past. Did well with Melissa Riggs but had to stop due to cost.  2. Hyperlipidemia associated with type 2 diabetes mellitus (Melissa Riggs) Labs discussed during visit today. Melissa Riggs never been on medications. Famliy history: Father  Assessment/Plan:   1. Type 2 diabetes mellitus with other specified complication, without long-term current use of insulin (HCC)  Start Mounjaro 2.5 mg SubQ once a week AND Start/restart Metformin XR 750 mg. Side effects discussed.  - tirzepatide [facility]) 2.5 MG/0.5ML Pen; Inject 2.5 mg into the skin once a week.  Dispense: 2 mL; Refill: 0  -Start metFORMIN (GLUCOPHAGE XR) 750 MG 24 hr tablet; Take 1 tablet (750 mg total) by mouth daily with breakfast.  Dispense: 30 tablet; Refill: 0  -Fill glucose blood test strip; Use as instructed  Dispense: 100  each; Refill: 0 -Fill Lancets 30G MISC; Use to check blood sugar bid  Dispense: 100 each; Refill: 0  Good blood sugar control is important to decrease the likelihood of diabetic complications such as nephropathy, neuropathy, limb loss, blindness, coronary artery disease, and death. Intensive lifestyle modification including diet, exercise and weight loss are the first line of treatment for diabetes.    2. Hyperlipidemia associated with type 2 diabetes mellitus ([place]) We discussed starting a statin. Cardiovascular risk and specific lipid/LDL goals reviewed.  We discussed several lifestyle modifications today and Melissa Riggs will continue to work on diet, exercise and weight loss efforts. Orders and follow up as documented in patient record.   Counseling Intensive lifestyle modifications are the first line treatment for this issue. Dietary changes: Increase soluble fiber. Decrease simple carbohydrates. Exercise changes: Moderate to vigorous-intensity aerobic activity 150 minutes per week if tolerated. Lipid-lowering medications: see documented in medical record.   3. Obesity, current BMI of 48.7 Melissa Riggs is currently in the action stage of change. As such, her goal is to continue with weight loss efforts. She has agreed to practicing portion control and making smarter food choices, such as increasing vegetables and decreasing simple carbohydrates.   Exercise goals: All adults should avoid inactivity. Some physical activity is better than none, and adults who participate in any amount of physical activity gain some health benefits.  Behavioral modification strategies: increasing lean protein intake, increasing vegetables, and increasing water intake.  Melissa Riggs has agreed to follow-up with our clinic in 2 weeks. She was  informed of the importance of frequent follow-up visits to maximize her success with intensive lifestyle modifications for her multiple health conditions.   Objective:   Blood pressure  123/89, pulse 76, temperature 98.8 F (37.1 C), height '5\' 3"'$  (1.6 m), weight 274 lb (124.3 kg), SpO2 96 %. Body mass index is 48.54 kg/m.  General: Cooperative, alert, well developed, in no acute distress. HEENT: Conjunctivae and lids unremarkable. Cardiovascular: Regular rhythm.  Lungs: Normal work of breathing. Neurologic: No focal deficits.   Lab Results  Component Value Date   CREATININE 0.56 (L) 10/27/2021   BUN 10 10/27/2021   NA 140 10/27/2021   K 4.6 10/27/2021   CL 104 10/27/2021   CO2 21 10/27/2021   Lab Results  Component Value Date   ALT 52 (H) 10/27/2021   AST 26 10/27/2021   ALKPHOS 69 10/27/2021   BILITOT <0.2 10/27/2021   Lab Results  Component Value Date   HGBA1C 9.0 (H) 10/27/2021   HGBA1C 11.5 (H) 02/27/2021   HGBA1C 11.4 (H) 06/17/2020   HGBA1C 5.9 03/02/2018   Lab Results  Component Value Date   INSULIN 97.1 (H) 10/27/2021   INSULIN 32.9 (H) 02/27/2021   INSULIN 54.7 (H) 06/17/2020   Lab Results  Component Value Date   TSH 1.540 06/17/2020   Lab Results  Component Value Date   CHOL 187 10/27/2021   HDL 32 (L) 10/27/2021   LDLCALC 115 (H) 10/27/2021   TRIG 227 (H) 10/27/2021   Lab Results  Component Value Date   VD25OH 15.3 (L) 02/27/2021   VD25OH 5.6 (L) 06/17/2020   Lab Results  Component Value Date   WBC 9.8 02/26/2021   HGB 15.2 (H) 02/26/2021   HCT 43.0 02/26/2021   MCV 80.8 02/26/2021   PLT 297 02/26/2021   No results found for: "IRON", "TIBC", "FERRITIN"  Attestation Statements:   Reviewed by clinician on day of visit: allergies, medications, problem list, medical history, surgical history, family history, social history, and previous encounter notes.  I, Brendell Tyus, RMA, am acting as transcriptionist for Everardo Pacific, FNP.  I have reviewed the above documentation for accuracy and completeness, and I agree with the above. Everardo Pacific, FNP

## 2022-02-17 ENCOUNTER — Telehealth (INDEPENDENT_AMBULATORY_CARE_PROVIDER_SITE_OTHER): Payer: Self-pay | Admitting: Nurse Practitioner

## 2022-02-17 NOTE — Telephone Encounter (Signed)
09/12 Patient called in and stated that she couldnt get the [facility] and she wanted to go back on the pervious medication. JE

## 2022-02-18 ENCOUNTER — Encounter (INDEPENDENT_AMBULATORY_CARE_PROVIDER_SITE_OTHER): Payer: Self-pay

## 2022-02-18 NOTE — Telephone Encounter (Signed)
Sent pt message below in My Chart.

## 2022-02-18 NOTE — Telephone Encounter (Signed)
I spoke with the pt to confirm which medication she wanted to restart. She stated that she was taking Glimepiride '2mg'$  once daily with Metformin.

## 2022-02-24 ENCOUNTER — Encounter (INDEPENDENT_AMBULATORY_CARE_PROVIDER_SITE_OTHER): Payer: Self-pay | Admitting: Nurse Practitioner

## 2022-02-24 ENCOUNTER — Other Ambulatory Visit (INDEPENDENT_AMBULATORY_CARE_PROVIDER_SITE_OTHER): Payer: Self-pay | Admitting: Nurse Practitioner

## 2022-02-24 ENCOUNTER — Ambulatory Visit (INDEPENDENT_AMBULATORY_CARE_PROVIDER_SITE_OTHER): Payer: PRIVATE HEALTH INSURANCE | Admitting: Nurse Practitioner

## 2022-02-24 VITALS — BP 124/90 | HR 79 | Temp 99.1°F | Ht 63.0 in | Wt 272.0 lb

## 2022-02-24 DIAGNOSIS — R748 Abnormal levels of other serum enzymes: Secondary | ICD-10-CM | POA: Diagnosis not present

## 2022-02-24 DIAGNOSIS — E669 Obesity, unspecified: Secondary | ICD-10-CM | POA: Diagnosis not present

## 2022-02-24 DIAGNOSIS — E1169 Type 2 diabetes mellitus with other specified complication: Secondary | ICD-10-CM | POA: Diagnosis not present

## 2022-02-24 DIAGNOSIS — Z6841 Body Mass Index (BMI) 40.0 and over, adult: Secondary | ICD-10-CM

## 2022-02-24 DIAGNOSIS — Z7984 Long term (current) use of oral hypoglycemic drugs: Secondary | ICD-10-CM

## 2022-02-24 MED ORDER — OZEMPIC (0.25 OR 0.5 MG/DOSE) 2 MG/3ML ~~LOC~~ SOPN
0.2500 mg | PEN_INJECTOR | SUBCUTANEOUS | 0 refills | Status: DC
Start: 2022-02-24 — End: 2022-02-24

## 2022-02-24 MED ORDER — GLIMEPIRIDE 2 MG PO TABS: 2.0000 mg | ORAL_TABLET | Freq: Every day | ORAL | 0 refills | Status: DC

## 2022-02-26 NOTE — Progress Notes (Signed)
Chief Complaint:   Melissa Riggs is here to discuss her progress with her obesity treatment plan along with follow-up of her obesity related diagnoses. Melissa Riggs is on practicing portion control and making smarter food choices, such as increasing vegetables and decreasing simple carbohydrates and states she is following her eating plan approximately 65% of the time. Melissa Riggs states she is exercising 0 minutes 0 times per week.  Today's visit was #: 14 Starting weight: 284 lbs Starting date: 06/17/2021 Today's weight: 272 lbs Today's date: 02/24/2022 Total lbs lost to date: 12 lbs Total lbs lost since last in-office visit: 2  Interim History: Melissa Riggs has done well with weight loss since her last visit. Struggles the most with dinner. Tired by the end of the day. Tends to eat out. No upcoming celebrations or vacations. Reports polyphagia or cravings. Drinking water and diet green tea.  Subjective:   1. Type 2 diabetes mellitus with other specified complication, without long-term current use of insulin ([place]) Melissa Riggs started on Metformin XR 750 mg after her last visit. Denies any side effects. Was unable to start [facility] due to cost. Fasting blood sugar= 110-180. Denies hypoglycemia. Took Mounjaro and Victoza in the past. Not on a statin.  2. Elevated liver enzymes Labs discussed during visit today. Denies abd pain.  Assessment/Plan:   1. Type 2 diabetes mellitus with other specified complication, without long-term current use of insulin (HCC) Continue Metformin. Start Ozempic 0.25 mg. If not covered will start Amaryl.  We will refill Amaryl 2 mg daily for 1 month with 0 refills.  -Refill glimepiride (AMARYL) 2 MG tablet; Take 1 tablet (2 mg total) by mouth daily before breakfast.  Dispense: 30 tablet; Refill: 0  Good blood sugar control is important to decrease the likelihood of diabetic complications such as nephropathy, neuropathy, limb loss, blindness, coronary artery disease,  and death. Intensive lifestyle modification including diet, exercise and weight loss are the first line of treatment for diabetes.    2. Elevated liver enzymes Continue working on dietary changes, exercise and weight loss.  Will continue to monitor.    3. Obesity, current BMI of 48.2 Melissa Riggs is currently in the action stage of change. As such, her goal is to continue with weight loss efforts. She has agreed to practicing portion control and making smarter food choices, such as increasing vegetables and decreasing simple carbohydrates.   Exercise goals: All adults should avoid inactivity. Some physical activity is better than none, and adults who participate in any amount of physical activity gain some health benefits.  Melissa Riggs notes renewal a gym membership last night.  Behavioral modification strategies: increasing lean protein intake, increasing vegetables, increasing water intake, and planning for success.  Melissa Riggs has agreed to follow-up with our clinic in 2 weeks. She was informed of the importance of frequent follow-up visits to maximize her success with intensive lifestyle modifications for her multiple health conditions.   Objective:   Blood pressure (!) 124/90, pulse 79, temperature 99.1 F (37.3 C), height '5\' 3"'$  (1.6 m), weight 272 lb (123.4 kg), SpO2 97 %. Body mass index is 48.18 kg/m.  General: Cooperative, alert, well developed, in no acute distress. HEENT: Conjunctivae and lids unremarkable. Cardiovascular: Regular rhythm.  Lungs: Normal work of breathing. Neurologic: No focal deficits.   Lab Results  Component Value Date   CREATININE 0.56 (L) 10/27/2021   BUN 10 10/27/2021   NA 140 10/27/2021   K 4.6 10/27/2021   CL 104 10/27/2021   CO2 21  10/27/2021   Lab Results  Component Value Date   ALT 52 (H) 10/27/2021   AST 26 10/27/2021   ALKPHOS 69 10/27/2021   BILITOT <0.2 10/27/2021   Lab Results  Component Value Date   HGBA1C 9.0 (H) 10/27/2021   HGBA1C  11.5 (H) 02/27/2021   HGBA1C 11.4 (H) 06/17/2020   HGBA1C 5.9 03/02/2018   Lab Results  Component Value Date   INSULIN 97.1 (H) 10/27/2021   INSULIN 32.9 (H) 02/27/2021   INSULIN 54.7 (H) 06/17/2020   Lab Results  Component Value Date   TSH 1.540 06/17/2020   Lab Results  Component Value Date   CHOL 187 10/27/2021   HDL 32 (L) 10/27/2021   LDLCALC 115 (H) 10/27/2021   TRIG 227 (H) 10/27/2021   Lab Results  Component Value Date   VD25OH 15.3 (L) 02/27/2021   VD25OH 5.6 (L) 06/17/2020   Lab Results  Component Value Date   WBC 9.8 02/26/2021   HGB 15.2 (H) 02/26/2021   HCT 43.0 02/26/2021   MCV 80.8 02/26/2021   PLT 297 02/26/2021   No results found for: "IRON", "TIBC", "FERRITIN"  Attestation Statements:   Reviewed by clinician on day of visit: allergies, medications, problem list, medical history, surgical history, family history, social history, and previous encounter notes.  I, Brendell Tyus, RMA, am acting as transcriptionist for Everardo Pacific, FNP.  I have reviewed the above documentation for accuracy and completeness, and I agree with the above. Everardo Pacific, FNP

## 2022-03-12 ENCOUNTER — Ambulatory Visit (INDEPENDENT_AMBULATORY_CARE_PROVIDER_SITE_OTHER): Payer: PRIVATE HEALTH INSURANCE | Admitting: Internal Medicine

## 2022-04-10 ENCOUNTER — Telehealth: Payer: PRIVATE HEALTH INSURANCE | Admitting: Physician Assistant

## 2022-04-10 DIAGNOSIS — H66001 Acute suppurative otitis media without spontaneous rupture of ear drum, right ear: Secondary | ICD-10-CM

## 2022-04-10 MED ORDER — NEOMYCIN-POLYMYXIN-HC 3.5-10000-1 OT SOLN: 3.0000 [drp] | Freq: Four times a day (QID) | OTIC | 0 refills | Status: DC

## 2022-04-10 MED ORDER — CEFDINIR 300 MG PO CAPS: 300.0000 mg | ORAL_CAPSULE | Freq: Two times a day (BID) | ORAL | 0 refills | Status: DC

## 2022-04-10 NOTE — Progress Notes (Signed)
Virtual Visit Consent   Melissa Riggs, you are scheduled for a virtual visit with a [facility] provider today. Just as with appointments in the office, your consent must be obtained to participate. Your consent will be active for this visit and any virtual visit you may have with one of our providers in the next 365 days. If you have a MyChart account, a copy of this consent can be sent to you electronically.  As this is a virtual visit, video technology does not allow for your provider to perform a traditional examination. This may limit your provider's ability to fully assess your condition. If your provider identifies any concerns that need to be evaluated in person or the need to arrange testing (such as labs, EKG, etc.), we will make arrangements to do so. Although advances in technology are sophisticated, we cannot ensure that it will always work on either your end or our end. If the connection with a video visit is poor, the visit may have to be switched to a telephone visit. With either a video or telephone visit, we are not always able to ensure that we have a secure connection.  By engaging in this virtual visit, you consent to the provision of healthcare and authorize for your insurance to be billed (if applicable) for the services provided during this visit. Depending on your insurance coverage, you may receive a charge related to this service.  I need to obtain your verbal consent now. Are you willing to proceed with your visit today? Varshini L Allender has provided verbal consent on 04/10/2022 for a virtual visit (video or telephone). Mar Daring, PA-C  Date: 04/10/2022 1:39 PM  Virtual Visit via Video Note   IMar Daring, connected with  TWANNA RESH  ([id number], 1996/04/03) on 04/10/22 at  1:30 PM EDT by a video-enabled telemedicine application and verified that I am speaking with the correct person using two  identifiers.  Location: Patient: Virtual Visit Location Patient: Home Provider: Virtual Visit Location Provider: Home Office   I discussed the limitations of evaluation and management by telemedicine and the availability of in person appointments. The patient expressed understanding and agreed to proceed.    History of Present Illness: Melissa Riggs is a 26 y.o. who identifies as a female who was assigned female at birth, and is being seen today for right ear pain.  HPI: Otalgia  There is pain in the right ear. This is a new problem. The current episode started 1 to 4 weeks ago. The problem occurs constantly. The problem has been gradually worsening. There has been no fever. Associated symptoms include coughing, ear discharge, headaches, hearing loss, rhinorrhea and a sore throat (very mild; most likely from post nasal drainage). Associated symptoms comments: Popping and fullness in ear. She has tried NSAIDs (sudafed) for the symptoms. The treatment provided no relief. There is no history of a chronic ear infection, hearing loss or a tympanostomy tube.    Problems:  Patient Active Problem List   Diagnosis Date Noted   Diabetes mellitus ([place]) 08/30/2020   Vitamin D deficiency 08/30/2020   Class 3 severe obesity with serious comorbidity and body mass index (BMI) of 45.0 to 49.9 in adult [facility]) 08/30/2020   Morbid obesity ([place]) 06/20/2020   Diabetes mellitus type 2 in obese ([place]) 06/18/2020   Viral URI 12/23/2018   Exposure to COVID-19 virus 11/18/2018   Suspected COVID-19 virus infection 11/18/2018   Hypothyroidism 03/02/2018   Anovulation 03/02/2018  Allergies:  Allergies  Allergen Reactions   Betadine [Povidone Iodine] Itching    Rxn. After last surgery, prepped with betadine.   Amoxicillin Diarrhea, Nausea And Vomiting and Other (See Comments)    dehydration   Amoxicillin Diarrhea   Penicillins Diarrhea, Nausea And Vomiting and Other (See Comments)     Dehydration Has patient had a PCN reaction causing immediate rash, facial/tongue/throat swelling, SOB or lightheadedness with hypotension: No Has patient had a PCN reaction causing severe rash involving mucus membranes or skin necrosis: No Has patient had a PCN reaction that required hospitalization: Yes Has patient had a PCN reaction occurring within the last 10 years: Yes If all of the above answers are "NO", then may proceed with Cephalosporin use.   Medications:  Current Outpatient Medications:    betamethasone valerate ointment (VALISONE) 0.1 %, Apply 1 application. topically 2 (two) times daily. Use for up to 2 weeks as needed, Disp: 30 g, Rfl: 0   blood glucose meter kit and supplies KIT, Use too Check FBS a.m. and 1 hour after evening meal, Disp: 1 each, Rfl: 0   cefdinir (OMNICEF) 300 MG capsule, Take 1 capsule (300 mg total) by mouth 2 (two) times daily., Disp: 14 capsule, Rfl: 0   glimepiride (AMARYL) 2 MG tablet, Take 1 tablet (2 mg total) by mouth daily before breakfast., Disp: 30 tablet, Rfl: 0   glucose blood test strip, Use as instructed, Disp: 100 each, Rfl: 0   Lancets 30G MISC, Use to check blood sugar bid, Disp: 100 each, Rfl: 0   levonorgestrel (MIRENA) 20 MCG/24HR IUD, 1 Intra Uterine Device (1 each total) by Intrauterine route once for 1 dose. To treat endometrial cancer, fertility sparing, Disp: 1 each, Rfl: 0   levonorgestrel (MIRENA) 20 MCG/DAY IUD, 1 each by Intrauterine route once., Disp: , Rfl:    metFORMIN (GLUCOPHAGE XR) 750 MG 24 hr tablet, Take 1 tablet (750 mg total) by mouth daily with breakfast., Disp: 30 tablet, Rfl: 0   neomycin-polymyxin-hydrocortisone (CORTISPORIN) OTIC solution, Place 3 drops into the right ear 4 (four) times daily. X 7 days, Disp: 10 mL, Rfl: 0  Observations/Objective: Patient is well-developed, well-nourished in no acute distress.  Resting comfortably at home.  Head is normocephalic, atraumatic.  No labored breathing.  Speech is  clear and coherent with logical content.  Patient is alert and oriented at baseline.    Assessment and Plan: 1. Non-recurrent acute suppurative otitis media of right ear without spontaneous rupture of tympanic membrane - cefdinir (OMNICEF) 300 MG capsule; Take 1 capsule (300 mg total) by mouth 2 (two) times daily.  Dispense: 14 capsule; Refill: 0 - neomycin-polymyxin-hydrocortisone (CORTISPORIN) OTIC solution; Place 3 drops into the right ear 4 (four) times daily. X 7 days  Dispense: 10 mL; Refill: 0  - Worsening symptoms that have not responded to OTC medications.  - Will give Omnicef and Cortisporin - Steam and humidifier can help - Stay well hydrated and get plenty of rest.  - Seek in person evaluation if no symptom improvement or if symptoms worsen   Follow Up Instructions: I discussed the assessment and treatment plan with the patient. The patient was provided an opportunity to ask questions and all were answered. The patient agreed with the plan and demonstrated an understanding of the instructions.  A copy of instructions were sent to the patient via MyChart unless otherwise noted below.    The patient was advised to call back or seek an in-person evaluation if the symptoms  worsen or if the condition fails to improve as anticipated.  Time:  I spent 10 minutes with the patient via telehealth technology discussing the above problems/concerns.    Mar Daring, PA-C

## 2022-04-10 NOTE — Patient Instructions (Signed)
Melissa Riggs, thank you for joining Mar Daring, PA-C for today's virtual visit.  While this provider is not your primary care provider (PCP), if your PCP is located in our provider database this encounter information will be shared with them immediately following your visit.   [facility] account gives you access to today's visit and all your visits, tests, and labs performed at [facility] " click here if you don't have a [facility] account or go to [email or web address]  Consent: (Patient) Melissa Riggs provided verbal consent for this virtual visit at the beginning of the encounter.  Current Medications:  Current Outpatient Medications:    betamethasone valerate ointment (VALISONE) 0.1 %, Apply 1 application. topically 2 (two) times daily. Use for up to 2 weeks as needed, Disp: 30 g, Rfl: 0   blood glucose meter kit and supplies KIT, Use too Check FBS a.m. and 1 hour after evening meal, Disp: 1 each, Rfl: 0   cefdinir (OMNICEF) 300 MG capsule, Take 1 capsule (300 mg total) by mouth 2 (two) times daily., Disp: 14 capsule, Rfl: 0   glimepiride (AMARYL) 2 MG tablet, Take 1 tablet (2 mg total) by mouth daily before breakfast., Disp: 30 tablet, Rfl: 0   glucose blood test strip, Use as instructed, Disp: 100 each, Rfl: 0   Lancets 30G MISC, Use to check blood sugar bid, Disp: 100 each, Rfl: 0   levonorgestrel (MIRENA) 20 MCG/24HR IUD, 1 Intra Uterine Device (1 each total) by Intrauterine route once for 1 dose. To treat endometrial cancer, fertility sparing, Disp: 1 each, Rfl: 0   levonorgestrel (MIRENA) 20 MCG/DAY IUD, 1 each by Intrauterine route once., Disp: , Rfl:    metFORMIN (GLUCOPHAGE XR) 750 MG 24 hr tablet, Take 1 tablet (750 mg total) by mouth daily with breakfast., Disp: 30 tablet, Rfl: 0   neomycin-polymyxin-hydrocortisone (CORTISPORIN) OTIC solution, Place 3 drops into the right ear 4 (four) times daily. X 7 days, Disp:  10 mL, Rfl: 0   Medications ordered in this encounter:  Meds ordered this encounter  Medications   cefdinir (OMNICEF) 300 MG capsule    Sig: Take 1 capsule (300 mg total) by mouth 2 (two) times daily.    Dispense:  14 capsule    Refill:  0    Order Specific Question:   Supervising Provider    Answer:   Chase Picket [id number]   neomycin-polymyxin-hydrocortisone (CORTISPORIN) OTIC solution    Sig: Place 3 drops into the right ear 4 (four) times daily. X 7 days    Dispense:  10 mL    Refill:  0    Order Specific Question:   Supervising Provider    Answer:   Chase Picket [[id number]]     *If you need refills on other medications prior to your next appointment, please contact your pharmacy*  Follow-Up: Call back or seek an in-person evaluation if the symptoms worsen or if the condition fails to improve as anticipated.  [place] [phone]  Other Instructions  Otitis Media, Adult  Otitis media occurs when there is inflammation and fluid in the middle ear with signs and symptoms of an acute infection. The middle ear is a part of the ear that contains bones for hearing as well as air that helps send sounds to the brain. When infected fluid builds up in this space, it causes pressure and can lead to an ear infection. The eustachian tube connects the middle  ear to the back of the nose (nasopharynx) and normally allows air into the middle ear. If the eustachian tube becomes blocked, fluid can build up and become infected. What are the causes? This condition is caused by a blockage in the eustachian tube. This can be caused by mucus or by swelling of the tube. Problems that can cause a blockage include: A cold or other upper respiratory infection. Allergies. An irritant, such as tobacco smoke. Enlarged adenoids. The adenoids are areas of soft tissue located high in the back of the throat, behind the nose and the roof of the mouth. They are part of the body's defense  system (immune system). A mass in the nasopharynx. Damage to the ear caused by pressure changes (barotrauma). What increases the risk? You are more likely to develop this condition if you: Smoke or are exposed to tobacco smoke. Have an opening in the roof of your mouth (cleft palate). Have gastroesophageal reflux. Have an immune system disorder. What are the signs or symptoms? Symptoms of this condition include: Ear pain. Fever. Decreased hearing. Tiredness (lethargy). Fluid leaking from the ear, if the eardrum is ruptured or has burst. Ringing in the ear. How is this diagnosed?  This condition is diagnosed with a physical exam. During the exam, your health care provider will use an instrument called an otoscope to look in your ear and check for redness, swelling, and fluid. He or she will also ask about your symptoms. Your health care provider may also order tests, such as: A pneumatic otoscopy. This is a test to check the movement of the eardrum. It is done by squeezing a small amount of air into the ear. A tympanogram. This is a test that shows how well the eardrum moves in response to air pressure in the ear canal. It provides a graph for your health care provider to review. How is this treated? This condition can go away on its own within 3-5 days. But if the condition is caused by a bacterial infection and does not go away on its own, or if it keeps coming back, your health care provider may: Prescribe antibiotic medicine to treat the infection. Prescribe or recommend medicines to control pain. Follow these instructions at home: Take over-the-counter and prescription medicines only as told by your health care provider. If you were prescribed an antibiotic medicine, take it as told by your health care provider. Do not stop taking the antibiotic even if you start to feel better. Keep all follow-up visits. This is important. Contact a health care provider if: You have bleeding from  your nose. There is a lump on your neck. You are not feeling better in 5 days. You feel worse instead of better. Get help right away if: You have severe pain that is not controlled with medicine. You have swelling, redness, or pain around your ear. You have stiffness in your neck. A part of your face is not moving (paralyzed). The bone behind your ear (mastoid bone) is tender when you touch it. You develop a severe headache. Summary Otitis media is redness, soreness, and swelling of the middle ear, usually resulting in pain and decreased hearing. This condition can go away on its own within 3-5 days. If the problem does not go away in 3-5 days, your health care provider may give you medicines to treat the infection. If you were prescribed an antibiotic medicine, take it as told by your health care provider. Follow all instructions that were given to you  by your health care provider. This information is not intended to replace advice given to you by your health care provider. Make sure you discuss any questions you have with your health care provider. Document Revised: 09/02/2020 Document Reviewed: 09/02/2020 Elsevier Patient Education  [place].    If you have been instructed to have an in-person evaluation today at a local Urgent Care facility, please use the link below. It will take you to a list of all of our available [facility], including address, phone number and hours of operation. Please do not delay care.  [facility]  If you or a family member do not have a primary care provider, use the link below to schedule a visit and establish care. When you choose a [facility] primary care physician or advanced practice provider, you gain a long-term partner in health. Find a Primary Care Provider  Learn more about [facility] in-office and virtual care options: [facility]

## 2022-04-16 ENCOUNTER — Other Ambulatory Visit: Payer: Self-pay

## 2022-04-16 ENCOUNTER — Emergency Department (HOSPITAL_BASED_OUTPATIENT_CLINIC_OR_DEPARTMENT_OTHER)
Admission: EM | Admit: 2022-04-16 | Discharge: 2022-04-16 | Disposition: A | Discharge status: Home / Self Care | Payer: PRIVATE HEALTH INSURANCE | Attending: Emergency Medicine | Admitting: Emergency Medicine

## 2022-04-16 ENCOUNTER — Encounter (HOSPITAL_BASED_OUTPATIENT_CLINIC_OR_DEPARTMENT_OTHER): Payer: Self-pay | Admitting: Emergency Medicine

## 2022-04-16 ENCOUNTER — Emergency Department (HOSPITAL_BASED_OUTPATIENT_CLINIC_OR_DEPARTMENT_OTHER): Payer: PRIVATE HEALTH INSURANCE

## 2022-04-16 DIAGNOSIS — X58XXXA Exposure to other specified factors, initial encounter: Secondary | ICD-10-CM | POA: Insufficient documentation

## 2022-04-16 DIAGNOSIS — J690 Pneumonitis due to inhalation of food and vomit: Secondary | ICD-10-CM | POA: Insufficient documentation

## 2022-04-16 DIAGNOSIS — Z7984 Long term (current) use of oral hypoglycemic drugs: Secondary | ICD-10-CM | POA: Insufficient documentation

## 2022-04-16 DIAGNOSIS — Z20822 Contact with and (suspected) exposure to covid-19: Secondary | ICD-10-CM | POA: Insufficient documentation

## 2022-04-16 DIAGNOSIS — H9201 Otalgia, right ear: Secondary | ICD-10-CM

## 2022-04-16 DIAGNOSIS — T161XXA Foreign body in right ear, initial encounter: Secondary | ICD-10-CM | POA: Insufficient documentation

## 2022-04-16 DIAGNOSIS — E119 Type 2 diabetes mellitus without complications: Secondary | ICD-10-CM | POA: Insufficient documentation

## 2022-04-16 DIAGNOSIS — E039 Hypothyroidism, unspecified: Secondary | ICD-10-CM | POA: Insufficient documentation

## 2022-04-16 LAB — RESP PANEL BY RT-PCR (FLU A&B, COVID) ARPGX2
Influenza A by PCR: NEGATIVE
Influenza B by PCR: NEGATIVE
SARS Coronavirus 2 by RT PCR: NEGATIVE

## 2022-04-16 MED ORDER — DOXYCYCLINE HYCLATE 100 MG PO CAPS: 100.0000 mg | ORAL_CAPSULE | Freq: Two times a day (BID) | ORAL | 0 refills | Status: DC

## 2022-04-16 MED ORDER — NEOMYCIN-POLYMYXIN-HC 3.5-10000-1 OT SUSP: 4.0000 [drp] | Freq: Three times a day (TID) | OTIC | 0 refills | Status: DC

## 2022-04-16 NOTE — ED Notes (Signed)
Patient states that she has to leave before dicharge papaers were printed. She signed discharge States that she has my chart and that she will look on there. Notified provider that she wanted her medication sent to the pharmacy

## 2022-04-16 NOTE — ED Triage Notes (Signed)
Pt having nasal congestion, cough, ear pain.  Seen for same last Friday and was given antibiotics and ear drops.  Pt states she is not improving.  No acute respiratory distress noted.  Productive cough.

## 2022-04-16 NOTE — Discharge Instructions (Signed)
Please take your antibiotics as prescribed. Use Cortisporin eardrop for ear pain. I recommend close follow-up with PCP for reevaluation.  Please do not hesitate to return to emergency department if worrisome signs symptoms we discussed become apparent.

## 2022-04-16 NOTE — ED Notes (Signed)
Patient cannt void at this time 

## 2022-04-16 NOTE — ED Notes (Addendum)
Patient report a lot of congestion has green -yellowish phlegm denies shortness of breath right now states that she was short of breath earlier patient is breathing non labored

## 2022-04-16 NOTE — ED Provider Notes (Signed)
[facility] EMERGENCY DEPARTMENT Provider Note   CSN: [id number] Arrival date & time: 04/16/22  4580     History  Chief Complaint  Patient presents with   URI    Melissa Riggs is a 26 y.o. female with a past medical history of diabetes, hypothyroidism, asthma presenting to the emergency department for evaluation of nasal congestion and ear pain. Patient states she started to have nasal congestion and right ear pain 2 weeks ago.  States she cannot hear out of her right ear.  Left ear is normal.   Patient was seen and diagnosed with acute otitis media and was put on cefdinir.  States she has finished her antibiotics however symptoms seem to be worsening.  States this morning she woke up with cough and shortness of breath prompting her to go to the emergency department.  Denies any chest pain, fever, sinus pain, nausea, vomiting, bowel changes, urinary symptoms.   URI      Home Medications Prior to Admission medications   Medication Sig Start Date End Date Taking? Authorizing Provider  betamethasone valerate ointment (VALISONE) 0.1 % Apply 1 application. topically 2 (two) times daily. Use for up to 2 weeks as needed 10/16/21   [name], MD  blood glucose meter kit and supplies KIT Use too Check FBS a.m. and 1 hour after evening meal 06/19/20   Jearld Lesch A, DO  cefdinir (OMNICEF) 300 MG capsule Take 1 capsule (300 mg total) by mouth 2 (two) times daily. 04/10/22   Mar Daring, PA-C  glimepiride (AMARYL) 2 MG tablet Take 1 tablet (2 mg total) by mouth daily before breakfast. 02/24/22   Everardo Pacific, FNP  glucose blood test strip Use as instructed 02/12/22   Everardo Pacific, FNP  Lancets 30G MISC Use to check blood sugar bid 02/12/22   Everardo Pacific, FNP  levonorgestrel (MIRENA) 20 MCG/24HR IUD 1 Intra Uterine Device (1 each total) by Intrauterine route once for 1 dose. To treat endometrial cancer, fertility sparing 08/23/18  03/29/20  Joylene John D, NP  levonorgestrel (MIRENA) 20 MCG/DAY IUD 1 each by Intrauterine route once.    [provider]  metFORMIN (GLUCOPHAGE XR) 750 MG 24 hr tablet Take 1 tablet (750 mg total) by mouth daily with breakfast. 02/12/22   Everardo Pacific, FNP  neomycin-polymyxin-hydrocortisone (CORTISPORIN) OTIC solution Place 3 drops into the right ear 4 (four) times daily. X 7 days 04/10/22   Mar Daring, PA-C      Allergies    Betadine [povidone iodine], Amoxicillin, Amoxicillin, and Penicillins    Review of Systems   Review of Systems  Physical Exam Updated Vital Signs BP (!) 129/95 (BP Location: Left Arm)   Pulse 91   Temp 98.1 F (36.7 C) (Oral)   Resp 20   Ht _0  (1.6 m)   Wt 122.5 kg   SpO2 98%   BMI 47.83 kg/m  Physical Exam Vitals and nursing note reviewed.  Constitutional:      Appearance: Normal appearance.  HENT:     Head: Normocephalic and atraumatic.     Mouth/Throat:     Mouth: Mucous membranes are moist.  Eyes:     General: No scleral icterus. Cardiovascular:     Rate and Rhythm: Normal rate and regular rhythm.     Pulses: Normal pulses.     Heart sounds: Normal heart sounds.  Pulmonary:     Effort: Pulmonary effort is normal.     Breath sounds: Normal breath  sounds.  Abdominal:     General: Abdomen is flat.     Palpations: Abdomen is soft.     Tenderness: There is no abdominal tenderness.  Musculoskeletal:        General: No deformity.  Skin:    General: Skin is warm.     Findings: No rash.  Neurological:     General: No focal deficit present.     Mental Status: She is alert.  Psychiatric:        Mood and Affect: Mood normal.     ED Results / Procedures / Treatments   Labs (all labs ordered are listed, but only abnormal results are displayed) Labs Reviewed  RESP PANEL BY RT-PCR (FLU A&B, COVID) [id number]  PREGNANCY, URINE    EKG None  Radiology DG Chest 2 View  Result Date: 04/16/2022 CLINICAL DATA:   Cough. EXAM: CHEST - 2 VIEW COMPARISON:  Chest x-ray Oct 19, 2016. FINDINGS: Subtle patchy opacities in both lungs. No visible pleural effusions or pneumothorax. Cardiomediastinal silhouette is within normal limits. IMPRESSION: Subtle patchy opacities in both lungs, possibly early pneumonia. Recommend follow-up chest radiographs in 3-4 weeks to ensure resolution. Electronically Signed   By: Margaretha Sheffield M.D.   On: 04/16/2022 11:53    Procedures Procedures    Medications Ordered in ED Medications - No data to display  ED Course/ Medical Decision Making/ A&P                           Medical Decision Making Amount and/or Complexity of Data Reviewed Labs: ordered. Radiology: ordered.  Risk Prescription drug management.   This patient presents to the ED for nasal congestion, cough, ear pain, this involves an extensive number of treatment options, and is a complaint that carries with a high risk of complications and morbidity.  The differential diagnosis includes sinusitis, otitis media, pneumonia.  This is not an exhaustive list.  Comorbidities that complicate the patient evaluation See HPI  Social determinants of health NA  Additional history obtained: Additional history obtained from EMR. External records from outside source obtained and review including prior labs  Cardiac monitoring/EKG: The patient was maintained on a cardiac monitor.  I personally reviewed and interpreted the cardiac monitor which showed an underlying rhythm of: Sinus rhythm.  Lab tests: I ordered and personally interpreted labs.  The pertinent results include Flu negative, COVID-negative  Imaging studies: I ordered imaging studies including chest x-ray which show early pneumonia. I personally reviewed, interpreted imaging and agree with the radiologist's interpretations.  Problem list/ ED course/ Critical interventions/ Medical management: HPI: See above Vital signs within normal range and stable  throughout visit. Laboratory/imaging studies significant for: See above. On physical examination, patient is afebrile and appears in no acute distress.  Heart sounds normal.  Lungs with no wheezing or rales.  There was a Q-tip cotton ball in right ear which blocked patient's hearing.  Ear irrigation done and a cottonball was removed.  Patient states she could hear much better.  There was erythema in the exterior ear canal.  I sent a Rx of Cortisporin to prevent infection.  Chest x-rays show early sign of pneumonia.  Patient's clinical presentations and laboratory/imaging studies are most concerned for pneumonia. I sent a Rx of doxycycline. Advised patient to take the antibiotic as prescribed.  Follow-up with PCP for further evaluation and management.  Return to the ER if new or worsening symptoms.. I have reviewed the patient home  medicines and have made adjustments as needed.  Consultations obtained: I requested consultation with attending Dr. Wyvonnia Dusky, and discussed lab and imaging findings as well as pertinent plan.  He agrees with the plan.  Disposition Continued outpatient therapy. Follow-up with PCP recommended for reevaluation of symptoms. Treatment plan discussed with patient.  Pt acknowledged understanding was agreeable to the plan. Worrisome signs and symptoms were discussed with patient, and patient acknowledged understanding to return to the ED if they noticed these signs and symptoms. Patient was stable upon discharge.   This chart was dictated using voice recognition software.  Despite best efforts to proofread,  errors can occur which can change the documentation meaning.          Final Clinical Impression(s) / ED Diagnoses Final diagnoses:  Foreign body of right ear, initial encounter  Right ear pain  Aspiration pneumonia of right lung, unspecified aspiration pneumonia type, unspecified part of lung ([place])    Rx / DC Orders ED Discharge Orders          Ordered     doxycycline (VIBRAMYCIN) 100 MG capsule  2 times daily        04/16/22 1449    neomycin-polymyxin-hydrocortisone (CORTISPORIN) 3.5-10000-1 OTIC suspension  3 times daily        04/16/22 1449              Rex Kras, PA 04/17/22 1561    Ezequiel Essex, MD 04/17/22 1028

## 2022-08-28 ENCOUNTER — Telehealth: Payer: PRIVATE HEALTH INSURANCE | Admitting: Nurse Practitioner

## 2022-08-28 DIAGNOSIS — J069 Acute upper respiratory infection, unspecified: Secondary | ICD-10-CM

## 2022-08-28 DIAGNOSIS — E119 Type 2 diabetes mellitus without complications: Secondary | ICD-10-CM

## 2022-08-28 DIAGNOSIS — J4521 Mild intermittent asthma with (acute) exacerbation: Secondary | ICD-10-CM

## 2022-08-28 MED ORDER — IPRATROPIUM BROMIDE 0.03 % NA SOLN: 2.0000 | Freq: Two times a day (BID) | NASAL | 12 refills | Status: DC

## 2022-08-28 MED ORDER — PREDNISONE 20 MG PO TABS: 20.0000 mg | ORAL_TABLET | Freq: Two times a day (BID) | ORAL | 0 refills | Status: AC

## 2022-08-28 MED ORDER — BENZONATATE 100 MG PO CAPS: 100.0000 mg | ORAL_CAPSULE | Freq: Three times a day (TID) | ORAL | 0 refills | Status: DC | PRN

## 2022-08-28 MED ORDER — METFORMIN HCL ER 500 MG PO TB24: 500.0000 mg | ORAL_TABLET | Freq: Every day | ORAL | 0 refills | Status: DC

## 2022-08-28 NOTE — Progress Notes (Signed)
Virtual Visit Consent   Melissa Riggs, you are scheduled for a virtual visit with a [facility] provider today. Just as with appointments in the office, your consent must be obtained to participate. Your consent will be active for this visit and any virtual visit you may have with one of our providers in the next 365 days. If you have a MyChart account, a copy of this consent can be sent to you electronically.  As this is a virtual visit, video technology does not allow for your provider to perform a traditional examination. This may limit your provider's ability to fully assess your condition. If your provider identifies any concerns that need to be evaluated in person or the need to arrange testing (such as labs, EKG, etc.), we will make arrangements to do so. Although advances in technology are sophisticated, we cannot ensure that it will always work on either your end or our end. If the connection with a video visit is poor, the visit may have to be switched to a telephone visit. With either a video or telephone visit, we are not always able to ensure that we have a secure connection.  By engaging in this virtual visit, you consent to the provision of healthcare and authorize for your insurance to be billed (if applicable) for the services provided during this visit. Depending on your insurance coverage, you may receive a charge related to this service.  I need to obtain your verbal consent now. Are you willing to proceed with your visit today? Melissa Riggs has provided verbal consent on 08/28/2022 for a virtual visit (video or telephone). Apolonio Schneiders, FNP  Date: 08/28/2022 8:30 AM  Virtual Visit via Video Note   I, Apolonio Schneiders, connected with  Melissa Riggs  ([id number], March 20, 1996) on 08/28/22 at  8:30 AM EDT by a video-enabled telemedicine application and verified that I am speaking with the correct person using two identifiers.  Location: Patient: Virtual  Visit Location Patient: Home Provider: Virtual Visit Location Provider: Home Office   I discussed the limitations of evaluation and management by telemedicine and the availability of in person appointments. The patient expressed understanding and agreed to proceed.    History of Present Illness: Melissa Riggs is a 27 y.o. who identifies as a female who was assigned female at birth, and is being seen today for a head cold. Symptom onset was 48 hours ago  Daughter has similar symptoms of URI   Yesterday she had sore throat, headache, decreased voice  Today she feels that the congestion has gone to her chest  She has right ear pain today   She has been using her Albuterol for relief of cough hx of Asthma   She did have a right ear infection in November- she is not overly prone to ear infection no history of surgery   She has been using Dayquil/Nyquil for relief  Sore throat has improved today   Denies a fever   She has not taken a COVID test   She has been lost to follow up for the weight loss clinic that she was using as a primary care clinic  She is now out of her Metformin and looking for a new primary care  She is monitoring her blood glucose and is on average: 210-230 She has been off the Metformin for the past 2 months now   Problems:  Patient Active Problem List   Diagnosis Date Noted   Diabetes mellitus ([place]) 08/30/2020  Vitamin D deficiency 08/30/2020   Class 3 severe obesity with serious comorbidity and body mass index (BMI) of 45.0 to 49.9 in adult [facility]) 08/30/2020   Morbid obesity ([place]) 06/20/2020   Diabetes mellitus type 2 in obese ([place]) 06/18/2020   Viral URI 12/23/2018   Exposure to COVID-19 virus 11/18/2018   Suspected COVID-19 virus infection 11/18/2018   Hypothyroidism 03/02/2018   Anovulation 03/02/2018    Allergies:  Allergies  Allergen Reactions   Betadine [Povidone Iodine] Itching    Rxn. After last surgery, prepped with betadine.    Amoxicillin Diarrhea, Nausea And Vomiting and Other (See Comments)    dehydration   Amoxicillin Diarrhea   Penicillins Diarrhea, Nausea And Vomiting and Other (See Comments)    Dehydration Has patient had a PCN reaction causing immediate rash, facial/tongue/throat swelling, SOB or lightheadedness with hypotension: No Has patient had a PCN reaction causing severe rash involving mucus membranes or skin necrosis: No Has patient had a PCN reaction that required hospitalization: Yes Has patient had a PCN reaction occurring within the last 10 years: Yes If all of the above answers are "NO", then may proceed with Cephalosporin use.    Current Outpatient Medications:    albuterol (VENTOLIN HFA) 108 (90 Base) MCG/ACT inhaler, Inhale into the lungs., Disp: , Rfl:    benzonatate (TESSALON) 100 MG capsule, Take 1 capsule (100 mg total) by mouth 3 (three) times daily as needed., Disp: 30 capsule, Rfl: 0   ipratropium (ATROVENT) 0.03 % nasal spray, Place 2 sprays into both nostrils every 12 (twelve) hours., Disp: 30 mL, Rfl: 12   metFORMIN (GLUCOPHAGE-XR) 500 MG 24 hr tablet, Take 1 tablet (500 mg total) by mouth daily with breakfast., Disp: 30 tablet, Rfl: 0   predniSONE (DELTASONE) 20 MG tablet, Take 1 tablet (20 mg total) by mouth 2 (two) times daily with a meal for 5 days., Disp: 10 tablet, Rfl: 0   blood glucose meter kit and supplies KIT, Use too Check FBS a.m. and 1 hour after evening meal, Disp: 1 each, Rfl: 0   glucose blood test strip, Use as instructed, Disp: 100 each, Rfl: 0   Lancets 30G MISC, Use to check blood sugar bid, Disp: 100 each, Rfl: 0   levonorgestrel (MIRENA) 20 MCG/24HR IUD, 1 Intra Uterine Device (1 each total) by Intrauterine route once for 1 dose. To treat endometrial cancer, fertility sparing, Disp: 1 each, Rfl: 0   levonorgestrel (MIRENA) 20 MCG/DAY IUD, 1 each by Intrauterine route once., Disp: , Rfl:    Observations/Objective: Patient is well-developed, well-nourished in  no acute distress.  Resting comfortably  at home.  Head is normocephalic, atraumatic.  No labored breathing.  Speech is clear and coherent with logical content.  Patient is alert and oriented at baseline.  Constant dry cough throughout visit without distress or audible wheezing   Assessment and Plan: 1. Viral upper respiratory tract infection Continue Albuterol every 4-6 hours  Mucinex in the day  Start Atrovent nasal spray twice daily  Prednisone burst for asthma exacerbation secondary to URI Push fluids  May switch to Nyquil and benzonatate at night for sleep    One time refill of Metformin, patient to continue to monitor blood glucose. Was on 750XR when she ran out two months ago. Will start 500mg  today with follow up scheduled in one week with new PCP   Meds ordered this encounter  Medications   benzonatate (TESSALON) 100 MG capsule    Sig: Take 1 capsule (100 mg  total) by mouth 3 (three) times daily as needed.    Dispense:  30 capsule    Refill:  0   ipratropium (ATROVENT) 0.03 % nasal spray    Sig: Place 2 sprays into both nostrils every 12 (twelve) hours.    Dispense:  30 mL    Refill:  12   predniSONE (DELTASONE) 20 MG tablet    Sig: Take 1 tablet (20 mg total) by mouth 2 (two) times daily with a meal for 5 days.    Dispense:  10 tablet    Refill:  0   metFORMIN (GLUCOPHAGE-XR) 500 MG 24 hr tablet    Sig: Take 1 tablet (500 mg total) by mouth daily with breakfast.    Dispense:  30 tablet    Refill:  0   Discussed need for follow up for worsening symptoms, onset of fever.      Follow Up Instructions: I discussed the assessment and treatment plan with the patient. The patient was provided an opportunity to ask questions and all were answered. The patient agreed with the plan and demonstrated an understanding of the instructions.  A copy of instructions were sent to the patient via MyChart unless otherwise noted below.    The patient was advised to call back or  seek an in-person evaluation if the symptoms worsen or if the condition fails to improve as anticipated.  Time:  I spent 20 minutes with the patient via telehealth technology discussing the above problems/concerns.    Apolonio Schneiders, FNP

## 2022-08-28 NOTE — Patient Instructions (Signed)
New Patient Visit with Melissa Riggs Monday September 07, 2022 Arrive by 8:15 AM EDT Starts at 8:30 AM EDT (30 minutes) Add to calendar [facility] [place] [id number] [phone]

## 2022-09-07 ENCOUNTER — Ambulatory Visit: Payer: PRIVATE HEALTH INSURANCE | Admitting: Family Medicine

## 2022-11-09 ENCOUNTER — Encounter (HOSPITAL_COMMUNITY): Payer: Self-pay

## 2022-11-09 ENCOUNTER — Ambulatory Visit (HOSPITAL_COMMUNITY)
Admission: RE | Admit: 2022-11-09 | Discharge: 2022-11-09 | Disposition: A | Discharge status: Home / Self Care | Payer: PRIVATE HEALTH INSURANCE | Source: Ambulatory Visit | Attending: Internal Medicine | Admitting: Internal Medicine

## 2022-11-09 VITALS — BP 113/82 | HR 87 | Temp 98.3°F | Resp 18

## 2022-11-09 DIAGNOSIS — J069 Acute upper respiratory infection, unspecified: Secondary | ICD-10-CM | POA: Diagnosis not present

## 2022-11-09 DIAGNOSIS — H60391 Other infective otitis externa, right ear: Secondary | ICD-10-CM

## 2022-11-09 DIAGNOSIS — J4521 Mild intermittent asthma with (acute) exacerbation: Secondary | ICD-10-CM

## 2022-11-09 MED ORDER — DEXAMETHASONE SODIUM PHOSPHATE 10 MG/ML IJ SOLN
INTRAMUSCULAR | Status: AC
  Filled 2022-11-09: qty 1

## 2022-11-09 MED ORDER — DEXAMETHASONE SODIUM PHOSPHATE 10 MG/ML IJ SOLN
10.0000 mg | Freq: Once | INTRAMUSCULAR | Status: AC
Start: 2022-11-09 — End: 2022-11-09
  Administered 2022-11-09: 10 mg via INTRAMUSCULAR

## 2022-11-09 MED ORDER — OFLOXACIN 0.3 % OT SOLN: 10.0000 [drp] | Freq: Every day | OTIC | 0 refills | Status: DC

## 2022-11-09 MED ORDER — BENZONATATE 100 MG PO CAPS: 100.0000 mg | ORAL_CAPSULE | Freq: Three times a day (TID) | ORAL | 0 refills | Status: DC | PRN

## 2022-11-09 MED ORDER — ALBUTEROL SULFATE HFA 108 (90 BASE) MCG/ACT IN AERS: 2.0000 | INHALATION_SPRAY | Freq: Four times a day (QID) | RESPIRATORY_TRACT | 0 refills | Status: DC | PRN

## 2022-11-09 NOTE — Discharge Instructions (Addendum)
Continue using albuterol inhaler as needed for cough, wheeze, and shortness of breath.  I gave you a shot of steroid in the clinic which will continue to help reduce inflammation and cough to the lungs.   Tessalon perles every 8 hours as needed for cough.  Ofloxacin ear drops 10 drops to the right ear canal once daily for the next 7 days to treat external ear infection.  If you develop any new or worsening symptoms or do not improve in the next 2 to 3 days, please return.  If your symptoms are severe, please go to the emergency room.  Follow-up with your primary care provider for further evaluation and management of your symptoms as well as ongoing wellness visits.  I hope you feel better!

## 2022-11-09 NOTE — ED Triage Notes (Signed)
Pt presents with c/o a cough 1 wk. Has nasal and chest congestion. Pt has taken mucinex and sudafed, has had some temporary relief. Now has rt ear swelling and drainage, sore throat.

## 2022-11-09 NOTE — ED Provider Notes (Signed)
[facility]    CSN: [id number] Arrival date & time: 11/09/22  1349      History   Chief Complaint Chief Complaint  Patient presents with   Cough    HPI Melissa Riggs is a 27 y.o. female.   Patient presents to urgent care for evaluation of cough, nasal congestion, generalized fatigue, and right ear pain/decreased hearing to the right ear that started 1 week ago.  Cough is productive with clear sputum.  Nasal mucus is also clear.  She had a fever at the beginning of illness, however has not had a fever in the last 3 to 4 days.  Denies nausea, vomiting, dizziness, abdominal pain, rash, chest pain, shortness of breath, weakness, and heart palpitations.  History of asthma, states she has needed to use her albuterol inhaler approximately 2 times a day for the past few days.  Albuterol inhaler helps with shortness of breath and wheezing.  No recent antibiotic/steroid use.  Non-smoker, denies drug use.  No recent known sick contacts with similar symptoms.  She has not taken a COVID test at home before arrival.   Cough   Past Medical History:  Diagnosis Date   Amenorrhea    Asthma    Asthma    when younger, not current problem   Cancer ([facility])    Complication of anesthesia    slow to awaken after tonsils and adenoid surgery yrs ago   Diabetes mellitus without complication ([facility])    Dysmenorrhea    Female infertility    Hormone disorder    PCOS (polycystic ovarian syndrome)    Prediabetes    Vision abnormalities    wears glasses    Patient Active Problem List   Diagnosis Date Noted   Diabetes mellitus ([facility]) 08/30/2020   Vitamin D deficiency 08/30/2020   Class 3 severe obesity with serious comorbidity and body mass index (BMI) of 45.0 to 49.9 in adult ([facility]) 08/30/2020   Morbid obesity ([facility]) 06/20/2020   Diabetes mellitus type 2 in obese 06/18/2020   Viral URI 12/23/2018   Exposure to COVID-19 virus 11/18/2018   Suspected COVID-19 virus infection 11/18/2018    Hypothyroidism 03/02/2018   Anovulation 03/02/2018    Past Surgical History:  Procedure Laterality Date   ADENOIDECTOMY     APPENDECTOMY     CARPAL TUNNEL RELEASE  12/14/2011   Procedure: CARPAL TUNNEL RELEASE;  Surgeon: Tami Ribas, MD;  Location: [facility];  Service: Orthopedics;  Laterality: Left;   CARPAL TUNNEL RELEASE  01/28/2012   Procedure: CARPAL TUNNEL RELEASE;  Surgeon: Tami Ribas, MD;  Location: [facility];  Service: Orthopedics;  Laterality: Right;  right carpal tunnel release   CARPAL TUNNEL RELEASE Bilateral    CATARACT EXTRACTION, BILATERAL     DILATATION & CURETTAGE/HYSTEROSCOPY WITH MYOSURE N/A 08/15/2018   Procedure: DILATATION & CURETTAGE/HYSTEROSCOPY WITH MYOSURE;  Surgeon: Romualdo Bolk, MD;  Location: [facility];  Service: Gynecology;  Laterality: N/A;  endometrial polyp   DILATION AND CURETTAGE OF UTERUS  for miscarriage this year   TONSILLECTOMY     TONSILLECTOMY     and adenoid     OB History     Gravida  0   Para  0   Term  0   Preterm  0   AB  0   Living  0      SAB  0   IAB  0   Ectopic  0   Multiple  0   Live Births  0            Home Medications    Prior to Admission medications   Medication Sig Start Date End Date Taking? Authorizing Provider  ofloxacin (FLOXIN) 0.3 % OTIC solution Place 10 drops into the right ear daily. 11/09/22  Yes Carlisle Beers, FNP  albuterol (VENTOLIN HFA) 108 (90 Base) MCG/ACT inhaler Inhale 2 puffs into the lungs every 6 (six) hours as needed for wheezing or shortness of breath. 11/09/22   Carlisle Beers, FNP  benzonatate (TESSALON) 100 MG capsule Take 1 capsule (100 mg total) by mouth 3 (three) times daily as needed. 11/09/22   Carlisle Beers, FNP  blood glucose meter kit and supplies KIT Use too Check FBS a.m. and 1 hour after evening meal 06/19/20   Corinna Capra A, DO  glucose blood test strip Use as instructed 02/12/22    Irene Limbo, FNP  ipratropium (ATROVENT) 0.03 % nasal spray Place 2 sprays into both nostrils every 12 (twelve) hours. 08/28/22   Viviano Simas, FNP  Lancets 30G MISC Use to check blood sugar bid 02/12/22   Irene Limbo, FNP  levonorgestrel (MIRENA) 20 MCG/24HR IUD 1 Intra Uterine Device (1 each total) by Intrauterine route once for 1 dose. To treat endometrial cancer, fertility sparing 08/23/18 03/29/20  Warner Mccreedy D, NP  levonorgestrel (MIRENA) 20 MCG/DAY IUD 1 each by Intrauterine route once.    [provider]  metFORMIN (GLUCOPHAGE-XR) 500 MG 24 hr tablet Take 1 tablet (500 mg total) by mouth daily with breakfast. 08/28/22 09/27/22  Viviano Simas, FNP    Family History Family History  Problem Relation Age of Onset   Depression Brother    Drug abuse Brother    Heart disease Maternal Grandmother    Hypertension Maternal Grandmother    Hypertension Paternal Grandmother    Depression Paternal Grandmother    Diabetes Mother    Obesity Mother    High Cholesterol Father    Alcohol abuse Father     Social History Social History   Tobacco Use   Smoking status: Never   Smokeless tobacco: Never  Vaping Use   Vaping Use: Never used  Substance Use Topics   Alcohol use: No   Drug use: No     Allergies   Betadine [povidone iodine], Amoxicillin, Amoxicillin, and Penicillins   Review of Systems Review of Systems  Respiratory:  Positive for cough.   Per HPI   Physical Exam Triage Vital Signs ED Triage Vitals  Enc Vitals Group     BP 11/09/22 1429 113/82     Pulse Rate 11/09/22 1429 87     Resp 11/09/22 1429 18     Temp 11/09/22 1429 98.3 F (36.8 C)     Temp Source 11/09/22 1429 Oral     SpO2 11/09/22 1429 97 %     Weight --      Height --      Head Circumference --      Peak Flow --      Pain Score 11/09/22 1428 3     Pain Loc --      Pain Edu? --      Excl. in GC? --    No data found.  Updated Vital Signs BP 113/82 (BP Location:  Right Arm)   Pulse 87   Temp 98.3 F (36.8 C) (Oral)   Resp 18   SpO2 97%   Visual Acuity Right Eye Distance:  Left Eye Distance:   Bilateral Distance:    Right Eye Near:   Left Eye Near:    Bilateral Near:     Physical Exam Vitals and nursing note reviewed.  Constitutional:      Appearance: She is not ill-appearing or toxic-appearing.  HENT:     Head: Normocephalic and atraumatic.     Right Ear: Hearing normal. Drainage (Copious purulent drainage to the right ear canal obstructing view of the right tympanic membrane), swelling and tenderness present.     Left Ear: Hearing, tympanic membrane, ear canal and external ear normal.     Ears:     Comments: Unable to visualize the right tympanic membrane due to copious drainage.    Nose: Nose normal.     Mouth/Throat:     Lips: Pink.     Mouth: Mucous membranes are moist. No injury.     Tongue: No lesions. Tongue does not deviate from midline.     Palate: No mass and lesions.     Pharynx: Oropharynx is clear. Uvula midline. No pharyngeal swelling, oropharyngeal exudate, posterior oropharyngeal erythema or uvula swelling.     Tonsils: No tonsillar exudate or tonsillar abscesses.  Eyes:     General: Lids are normal. Vision grossly intact. Gaze aligned appropriately.     Extraocular Movements: Extraocular movements intact.     Conjunctiva/sclera: Conjunctivae normal.  Cardiovascular:     Rate and Rhythm: Normal rate and regular rhythm.     Heart sounds: Normal heart sounds, S1 normal and S2 normal.  Pulmonary:     Effort: Pulmonary effort is normal. No respiratory distress.     Breath sounds: Normal breath sounds and air entry. No wheezing, rhonchi or rales.     Comments: Harsh cough elicited with deep inspiration.  Coarse breath sounds throughout. Chest:     Chest wall: No tenderness.  Musculoskeletal:     Cervical back: Neck supple.  Skin:    General: Skin is warm and dry.     Capillary Refill: Capillary refill takes less  than 2 seconds.     Findings: No rash.  Neurological:     General: No focal deficit present.     Mental Status: She is alert and oriented to person, place, and time. Mental status is at baseline.     Cranial Nerves: No dysarthria or facial asymmetry.  Psychiatric:        Mood and Affect: Mood normal.        Speech: Speech normal.        Behavior: Behavior normal.        Thought Content: Thought content normal.        Judgment: Judgment normal.      UC Treatments / Results  Labs (all labs ordered are listed, but only abnormal results are displayed) Labs Reviewed - No data to display  EKG   Radiology No results found.  Procedures Procedures (including critical care time)  Medications Ordered in UC Medications  dexamethasone (DECADRON) injection 10 mg (has no administration in time range)    Initial Impression / Assessment and Plan / UC Course  I have reviewed the triage vital signs and the nursing notes.  Pertinent labs & imaging results that were available during my care of the patient were reviewed by me and considered in my medical decision making (see chart for details).   1.  Mild intermittent asthma with acute exacerbation, viral upper respiratory tract infection Presentation is consistent with asthma exacerbation likely triggered  by viral URI.  Deferred imaging based on stable cardiopulmonary exam and hemodynamically stable vital signs.  Dexamethasone 10 mg IM given for cough, shortness of breath, and wheeze.  This will continue to work in patient's body over the next 2 to 3 days, therefore will defer use of oral steroid burst given history of DM2.  Continue albuterol 2 puffs every 4-6 hours as needed for shortness of breath and wheeze.  Tessalon Perles every 8 hours as needed for cough.  May continue using over-the-counter medications to help with symptoms for further relief.  2.  Infective otitis externa of right ear Ofloxacin eardrops sent to pharmacy to be taken  as prescribed.  I am unable to visualize the right tympanic membrane. Return precautions discussed.   Discussed physical exam and available lab work findings in clinic with patient.  Counseled patient regarding appropriate use of medications and potential side effects for all medications recommended or prescribed today. Discussed red flag signs and symptoms of worsening condition,when to call the PCP office, return to urgent care, and when to seek higher level of care in the emergency department. Patient verbalizes understanding and agreement with plan. All questions answered. Patient discharged in stable condition.    Final Clinical Impressions(s) / UC Diagnoses   Final diagnoses:  Mild intermittent asthma with (acute) exacerbation  Viral upper respiratory tract infection  Infective otitis externa of right ear     Discharge Instructions      Continue using albuterol inhaler as needed for cough, wheeze, and shortness of breath.  I gave you a shot of steroid in the clinic which will continue to help reduce inflammation and cough to the lungs.   Tessalon perles every 8 hours as needed for cough.  Ofloxacin ear drops 10 drops to the right ear canal once daily for the next 7 days to treat external ear infection.  If you develop any new or worsening symptoms or do not improve in the next 2 to 3 days, please return.  If your symptoms are severe, please go to the emergency room.  Follow-up with your primary care provider for further evaluation and management of your symptoms as well as ongoing wellness visits.  I hope you feel better!    ED Prescriptions     Medication Sig Dispense Auth. Provider   benzonatate (TESSALON) 100 MG capsule Take 1 capsule (100 mg total) by mouth 3 (three) times daily as needed. 30 capsule Carlisle Beers, FNP   albuterol (VENTOLIN HFA) 108 (90 Base) MCG/ACT inhaler Inhale 2 puffs into the lungs every 6 (six) hours as needed for wheezing or shortness of  breath. 8 g Reita May M, FNP   ofloxacin (FLOXIN) 0.3 % OTIC solution Place 10 drops into the right ear daily. 5 mL Carlisle Beers, FNP      PDMP not reviewed this encounter.   Carlisle Beers, [place] 11/09/22 1526

## 2022-12-16 ENCOUNTER — Ambulatory Visit (INDEPENDENT_AMBULATORY_CARE_PROVIDER_SITE_OTHER): Payer: PRIVATE HEALTH INSURANCE | Admitting: Radiology

## 2022-12-16 VITALS — BP 122/86

## 2022-12-16 DIAGNOSIS — Z113 Encounter for screening for infections with a predominantly sexual mode of transmission: Secondary | ICD-10-CM

## 2022-12-16 DIAGNOSIS — N760 Acute vaginitis: Secondary | ICD-10-CM

## 2022-12-16 LAB — WET PREP FOR TRICH, YEAST, CLUE

## 2022-12-16 MED ORDER — NYSTATIN-TRIAMCINOLONE 100000-0.1 UNIT/GM-% EX OINT: 1.0000 | TOPICAL_OINTMENT | Freq: Two times a day (BID) | CUTANEOUS | 0 refills | Status: DC

## 2022-12-16 MED ORDER — FLUCONAZOLE 150 MG PO TABS: 150.0000 mg | ORAL_TABLET | ORAL | 0 refills | Status: DC

## 2022-12-16 MED ORDER — METRONIDAZOLE 500 MG PO TABS: 500.0000 mg | ORAL_TABLET | Freq: Two times a day (BID) | ORAL | 0 refills | Status: DC

## 2022-12-16 NOTE — Progress Notes (Signed)
      Subjective: Melissa Riggs is a 27 y.o. female who complains of vaginal yeast infection, discharge, itching. Patient has been off diabetic medication x's 2 months. Patient has appointment scheduled at weight loss clinic this month and PCP next month.    Review of Systems  All other systems reviewed and are negative.   Past Medical History:  Diagnosis Date   Amenorrhea    Asthma    Asthma    when younger, not current problem   Cancer (HCC)    Complication of anesthesia    slow to awaken after tonsils and adenoid surgery yrs ago   Diabetes mellitus without complication (HCC)    Dysmenorrhea    Female infertility    Hormone disorder    PCOS (polycystic ovarian syndrome)    Prediabetes    Vision abnormalities    wears glasses      Objective:  Today's Vitals   12/16/22 1526  BP: 122/86   There is no height or weight on file to calculate BMI.   -General: no acute distress -Vulva: without lesions or discharge -Vagina: discharge present, aptima swab and wet prep obtained -Cervix: no lesion or discharge, no CMT -Perineum: no lesions -Uterus: Mobile, non tender -Adnexa: no masses or tenderness   Microscopic wet-mount exam shows clue cells, hyphae.   Raynelle Fanning, CMA present for exam  Assessment:/Plan:  1. Vulvovaginitis - WET PREP FOR TRICH, YEAST, CLUE - fluconazole (DIFLUCAN) 150 MG tablet; Take 1 tablet (150 mg total) by mouth every 3 (three) days.  Dispense: 3 tablet; Refill: 0 - nystatin-triamcinolone ointment (MYCOLOG); Apply 1 Application topically 2 (two) times daily.  Dispense: 30 g; Refill: 0 - metroNIDAZOLE (FLAGYL) 500 MG tablet; Take 1 tablet (500 mg total) by mouth 2 (two) times daily.  Dispense: 14 tablet; Refill: 0  2. Screen for STD (sexually transmitted disease) - SURESWAB CT/NG/T. vaginalis   Avoid intercourse until symptoms are resolved. Safe sex encouraged. Avoid the use of soaps or perfumed products in the peri area. Avoid tub  baths and sitting in sweaty or wet clothing for prolonged periods of time.

## 2022-12-17 LAB — SURESWAB CT/NG/T. VAGINALIS
C. trachomatis RNA, TMA: NOT DETECTED
N. gonorrhoeae RNA, TMA: NOT DETECTED
Trichomonas vaginalis RNA: NOT DETECTED

## 2023-01-28 ENCOUNTER — Encounter: Payer: Self-pay | Admitting: Family Medicine

## 2023-01-28 ENCOUNTER — Ambulatory Visit (INDEPENDENT_AMBULATORY_CARE_PROVIDER_SITE_OTHER): Payer: PRIVATE HEALTH INSURANCE | Admitting: Family Medicine

## 2023-01-28 VITALS — BP 110/88 | HR 75 | Temp 98.8°F | Ht 63.5 in | Wt 269.8 lb

## 2023-01-28 DIAGNOSIS — E282 Polycystic ovarian syndrome: Secondary | ICD-10-CM | POA: Insufficient documentation

## 2023-01-28 DIAGNOSIS — Z6841 Body Mass Index (BMI) 40.0 and over, adult: Secondary | ICD-10-CM

## 2023-01-28 DIAGNOSIS — Z7984 Long term (current) use of oral hypoglycemic drugs: Secondary | ICD-10-CM

## 2023-01-28 DIAGNOSIS — E1169 Type 2 diabetes mellitus with other specified complication: Secondary | ICD-10-CM

## 2023-01-28 LAB — POCT GLYCOSYLATED HEMOGLOBIN (HGB A1C): Hemoglobin A1C: 11 % — AB (ref 4.0–5.6)

## 2023-01-28 MED ORDER — GLIMEPIRIDE 2 MG PO TABS: 2.0000 mg | ORAL_TABLET | Freq: Every day | ORAL | 1 refills | Status: DC

## 2023-01-28 MED ORDER — PHENTERMINE HCL 15 MG PO CAPS: 15.0000 mg | ORAL_CAPSULE | ORAL | 0 refills | Status: DC

## 2023-01-28 MED ORDER — METFORMIN HCL ER 750 MG PO TB24: 750.0000 mg | ORAL_TABLET | Freq: Every day | ORAL | 1 refills | Status: DC

## 2023-01-28 MED ORDER — BLOOD GLUCOSE TEST VI STRP: 1.0000 | ORAL_STRIP | Freq: Every day | 3 refills | Status: AC

## 2023-01-28 MED ORDER — LANCETS MISC: 1.0000 | Freq: Every day | 3 refills | Status: AC

## 2023-01-28 MED ORDER — PHENTERMINE HCL 15 MG PO CAPS
15.0000 mg | ORAL_CAPSULE | ORAL | 0 refills | Status: DC
Start: 2023-01-28 — End: 2023-04-16

## 2023-01-28 NOTE — Progress Notes (Signed)
New Patient Office Visit  Subjective    Patient ID: Melissa Riggs, female    DOB: [birth year]  Age: 27 y.o. MRN: [id number]  CC:  Chief Complaint  Patient presents with   Establish Care    HPI Melissa Riggs presents to establish care Patient reports she was [facility]. States that she has a history of diabetes and used to be on mounjaro but she changed insurances and they did not cover the [employer]. States that they switched her to metformin and glipizide. States that [place] was [place] last year and was not able to follow up. States that she also wants to lose weight. Has been following the diet plan that [facility] when she was their patient, however she is having a lot of trouble following the diet.   Patient reports that she has an appointment OB/GYN in Sept for her pap smear. States that she was told that she has PCOS and some sort of cancer that was seen on previous biopsy. States that she has an IUD in place to help with bleeding, wants to eventually have children at some point.   Pt reports she hasn't been sleeping well at night. States that she has been having more anxiety lately and trouble falling asleep.   I have reviewed all aspects of the patient's medical history including social, family, and surgical history. I reviewed [facility], previous labs, GYN notes, etc.  Current Outpatient Medications  Medication Instructions   albuterol (VENTOLIN HFA) 108 (90 Base) MCG/ACT inhaler 2 puffs, Inhalation, Every 6 hours PRN   glimepiride (AMARYL) 2 mg, Oral, Daily before breakfast   Glucose Blood (BLOOD GLUCOSE TEST STRIPS) STRP 1 each, In Vitro, Daily   Lancets MISC 1 each, Does not apply, Daily   levonorgestrel (MIRENA) 20 MCG/DAY IUD 1 each, Intrauterine,  Once   metFORMIN (GLUCOPHAGE-XR) 750 mg, Oral, Daily with breakfast    nystatin-triamcinolone ointment (MYCOLOG) 1 Application, Topical, 2 times daily   [START ON 03/26/2023] phentermine 15 mg, Oral, BH-each morning   [START ON 02/26/2023] phentermine 15 mg, Oral, BH-each morning   phentermine 15 mg, Oral, BH-each morning    Past Medical History:  Diagnosis Date   Amenorrhea    Asthma    Asthma    when younger, not current problem   Cancer (HCC)    Complication of anesthesia    slow to awaken after tonsils and adenoid surgery yrs ago   Diabetes mellitus without complication (HCC)    Dysmenorrhea    Female infertility    Hormone disorder    PCOS (polycystic ovarian syndrome)    Prediabetes    Vision abnormalities    wears glasses    Past Surgical History:  Procedure Laterality Date   ADENOIDECTOMY     APPENDECTOMY     CARPAL TUNNEL RELEASE  12/14/2011   Procedure: CARPAL TUNNEL RELEASE;  Surgeon: Tami Ribas, MD;  Location: [facility];  Service: Orthopedics;  Laterality: Left;   CARPAL TUNNEL RELEASE  01/28/2012   Procedure: CARPAL TUNNEL RELEASE;  Surgeon: Tami Ribas, MD;  Location: [facility];  Service: Orthopedics;  Laterality: Right;  right carpal tunnel release   CARPAL TUNNEL RELEASE Bilateral    CATARACT EXTRACTION, BILATERAL     DILATATION & CURETTAGE/HYSTEROSCOPY WITH MYOSURE N/A 08/15/2018   Procedure: DILATATION & CURETTAGE/HYSTEROSCOPY WITH  [email or web address];  Surgeon: Romualdo Bolk, MD;  Location: [facility];  Service: Gynecology;  Laterality: N/A;  endometrial polyp   DILATION AND CURETTAGE OF UTERUS  for miscarriage this year   TONSILLECTOMY     TONSILLECTOMY     and adenoid     Family History  Problem Relation Age of Onset   Depression Brother    Drug abuse Brother    Heart disease Maternal Grandmother    Hypertension Maternal Grandmother    Hypertension Paternal Grandmother    Depression Paternal Grandmother    Diabetes Mother    Obesity Mother    High Cholesterol Father     Alcohol abuse Father     Social History   Socioeconomic History   Marital status: Married    Spouse name: Donald Pore   Number of children: Not on file   Years of education: Not on file   Highest education level: Not on file  Occupational History   Occupation: Scientist, water quality  Tobacco Use   Smoking status: Never   Smokeless tobacco: Never  Vaping Use   Vaping status: Never Used  Substance and Sexual Activity   Alcohol use: Yes    Comment: socially   Drug use: No   Sexual activity: Yes    Birth control/protection: I.U.D.    Comment: had miscarriage may 2013  Other Topics Concern   Not on file  Social History Narrative   ** Merged History Encounter **       Social Determinants of Health   Financial Resource Strain: Not on file  Food Insecurity: Not on file  Transportation Needs: Not on file  Physical Activity: Not on file  Stress: Not on file  Social Connections: Unknown (10/21/2021)   Received from [facility]   Social Network    Social Network: Not on file  Intimate Partner Violence: Unknown (09/11/2021)   Received from [facility]   HITS    Physically Hurt: Not on file    Insult or Talk Down To: Not on file    Threaten Physical Harm: Not on file    Scream or Curse: Not on file    Review of Systems  All other systems reviewed and are negative.       Objective    BP 110/88 (BP Location: Left Arm, Patient Position: Sitting, Cuff Size: Large)   Pulse 75   Temp 98.8 F (37.1 C) (Oral)   Ht 5' 3.5" (1.613 m)   Wt 269 lb 12.8 oz (122.4 kg)   SpO2 98%   BMI 47.04 kg/m   Physical Exam Vitals reviewed.  Constitutional:      Appearance: Normal appearance. She is well-groomed. She is morbidly obese.  Eyes:     Conjunctiva/sclera: Conjunctivae normal.  Neck:     Thyroid: No thyromegaly.     Comments: Acanthosis nigricans on the neck, she also has hirsutism on the chest and chin Cardiovascular:     Rate and Rhythm: Normal rate and regular rhythm.      Pulses: Normal pulses.     Heart sounds: S1 normal and S2 normal.  Pulmonary:     Effort: Pulmonary effort is normal.     Breath sounds: Normal breath sounds and air entry.  Abdominal:     General: Bowel sounds are normal.  Musculoskeletal:     Right lower leg: No edema.     Left lower leg: No edema.  Neurological:     Mental Status: She is alert and oriented  to person, place, and time. Mental status is at baseline.     Gait: Gait is intact.  Psychiatric:        Mood and Affect: Mood and affect normal.        Speech: Speech normal.        Behavior: Behavior normal.        Judgment: Judgment normal.     Last hemoglobin A1c Lab Results  Component Value Date   HGBA1C 11.0 (A) 01/28/2023        Assessment & Plan:  Type 2 diabetes mellitus with other specified complication, without long-term current use of insulin (HCC) Assessment & Plan: Severely uncontrolled. Will restart her metformin 750 mg daily and glimipride 2 mg daily. I will see her every 3 months for A1C checks. I am ordering all new annual labs for surveillance.   Orders: -     POCT glycosylated hemoglobin (Hb A1C) -     Blood Glucose Test; 1 each by In Vitro route daily.  Dispense: 100 each; Refill: 3 -     Lancets; 1 each by Does not apply route daily.  Dispense: 100 each; Refill: 3 -     metFORMIN HCl ER; Take 1 tablet (750 mg total) by mouth daily with breakfast.  Dispense: 90 tablet; Refill: 1 -     Glimepiride; Take 1 tablet (2 mg total) by mouth daily before breakfast.  Dispense: 90 tablet; Refill: 1 -     Lipid panel; Future -     Comprehensive metabolic panel; Future -     Microalbumin / creatinine urine ratio; Future  Class 3 severe obesity with serious comorbidity and body mass index (BMI) of 45.0 to 49.9 in adult, unspecified obesity type [facility]) Assessment & Plan: I have had an extensive 30 minute conversation today with the patient about healthy eating habits, exercise, calorie and carb goals for  sustainable and successful weight loss. I gave the patient caloric and protein daily intake values as well as described the importance of increasing fiber and water intake. I discussed weight loss medications that could be used in the treatment of this patient. Handouts on low carb eating were given to the patient.    Will start patient on phentermine 15 mg capsules daily to help her with appetite suppression while she is starting on the low carb diet. I advised her to aim for a goal of 100 grams of carbs or less per day.   Orders: -     Phentermine HCl; Take 1 capsule (15 mg total) by mouth every morning.  Dispense: 30 capsule; Refill: 0 -     Phentermine HCl; Take 1 capsule (15 mg total) by mouth every morning.  Dispense: 30 capsule; Refill: 0 -     Phentermine HCl; Take 1 capsule (15 mg total) by mouth every morning.  Dispense: 30 capsule; Refill: 0  PCOS (polycystic ovarian syndrome) Assessment & Plan: We discussed starting spironolactone to help reduce testosterone levels, will consider doing this next visit after she is back on her DM medications. She has an appt with her GYN coming up.      Return in about 3 months (around 04/30/2023) for DM.   Karie Georges, MD

## 2023-01-28 NOTE — Assessment & Plan Note (Signed)
I have had an extensive 30 minute conversation today with the patient about healthy eating habits, exercise, calorie and carb goals for sustainable and successful weight loss. I gave the patient caloric and protein daily intake values as well as described the importance of increasing fiber and water intake. I discussed weight loss medications that could be used in the treatment of this patient. Handouts on low carb eating were given to the patient.    Will start patient on phentermine 15 mg capsules daily to help her with appetite suppression while she is starting on the low carb diet. I advised her to aim for a goal of 100 grams of carbs or less per day.

## 2023-01-28 NOTE — Assessment & Plan Note (Addendum)
We discussed starting spironolactone to help reduce testosterone levels, will consider doing this next visit after she is back on her DM medications. She has an appt with her GYN coming up.

## 2023-01-28 NOTE — Assessment & Plan Note (Signed)
Severely uncontrolled. Will restart her metformin 750 mg daily and glimipride 2 mg daily. I will see her every 3 months for A1C checks. I am ordering all new annual labs for surveillance.

## 2023-02-04 ENCOUNTER — Other Ambulatory Visit: Payer: PRIVATE HEALTH INSURANCE

## 2023-02-05 ENCOUNTER — Other Ambulatory Visit (INDEPENDENT_AMBULATORY_CARE_PROVIDER_SITE_OTHER): Payer: PRIVATE HEALTH INSURANCE

## 2023-02-05 DIAGNOSIS — E1169 Type 2 diabetes mellitus with other specified complication: Secondary | ICD-10-CM

## 2023-02-05 LAB — LIPID PANEL
Cholesterol: 228 mg/dL — ABNORMAL HIGH (ref 0–200)
HDL: 36.7 mg/dL — ABNORMAL LOW (ref >39.00)
NonHDL: 191.49
Total CHOL/HDL Ratio: 6
Triglycerides: 232 mg/dL — ABNORMAL HIGH (ref 0.0–149.0)
VLDL: 46.4 mg/dL — ABNORMAL HIGH (ref 0.0–40.0)

## 2023-02-05 LAB — COMPREHENSIVE METABOLIC PANEL
ALT: 52 U/L — ABNORMAL HIGH (ref 0–35)
AST: 32 U/L (ref 0–37)
Albumin: 4.4 g/dL (ref 3.5–5.2)
Alkaline Phosphatase: 65 U/L (ref 39–117)
BUN: 11 mg/dL (ref 6–23)
CO2: 26 mEq/L (ref 19–32)
Calcium: 9.7 mg/dL (ref 8.4–10.5)
Chloride: 102 mEq/L (ref 96–112)
Creatinine, Ser: 0.62 mg/dL (ref 0.40–1.20)
GFR: 120.72 mL/min (ref >60.00)
Glucose, Bld: 154 mg/dL — ABNORMAL HIGH (ref 70–99)
Potassium: 3.9 mEq/L (ref 3.5–5.1)
Sodium: 137 mEq/L (ref 135–145)
Total Bilirubin: 0.6 mg/dL (ref 0.2–1.2)
Total Protein: 7.1 g/dL (ref 6.0–8.3)

## 2023-02-05 LAB — MICROALBUMIN / CREATININE URINE RATIO
Creatinine,U: 236.9 mg/dL
Microalb Creat Ratio: 36.6 mg/g — ABNORMAL HIGH (ref 0.0–30.0)
Microalb, Ur: 86.8 mg/dL — ABNORMAL HIGH (ref 0.0–1.9)

## 2023-02-05 LAB — LDL CHOLESTEROL, DIRECT: Direct LDL: 203 mg/dL

## 2023-02-10 ENCOUNTER — Encounter: Payer: Self-pay | Admitting: Family Medicine

## 2023-02-10 DIAGNOSIS — E782 Mixed hyperlipidemia: Secondary | ICD-10-CM

## 2023-02-11 MED ORDER — ROSUVASTATIN CALCIUM 5 MG PO TABS: 5.0000 mg | ORAL_TABLET | Freq: Every day | ORAL | 1 refills | Status: DC

## 2023-02-26 ENCOUNTER — Ambulatory Visit: Payer: PRIVATE HEALTH INSURANCE | Admitting: Radiology

## 2023-03-09 ENCOUNTER — Encounter: Payer: Self-pay | Admitting: Family Medicine

## 2023-03-09 ENCOUNTER — Ambulatory Visit: Payer: PRIVATE HEALTH INSURANCE | Admitting: Family Medicine

## 2023-03-09 VITALS — BP 131/86 | HR 84 | Wt 272.6 lb

## 2023-03-09 DIAGNOSIS — J069 Acute upper respiratory infection, unspecified: Secondary | ICD-10-CM | POA: Diagnosis not present

## 2023-03-09 DIAGNOSIS — J040 Acute laryngitis: Secondary | ICD-10-CM | POA: Diagnosis not present

## 2023-03-09 MED ORDER — BENZONATATE 200 MG PO CAPS: 200.0000 mg | ORAL_CAPSULE | Freq: Two times a day (BID) | ORAL | 0 refills | Status: DC | PRN

## 2023-03-09 NOTE — Progress Notes (Signed)
Established Patient Office Visit   Subjective:  Patient ID: Melissa Riggs, female    DOB: 05-22-1996  Age: 27 y.o. MRN: [id number]  Chief Complaint  Patient presents with   Sore Throat    Started a few days ago; Sore throat, started with head congestion, cough, phlegm, throat feels swollen. Pt has been drinking hot tea and taking sudafed but neither touches throat.     Sore Throat  Associated symptoms include coughing. Pertinent negatives include no abdominal pain, diarrhea, headaches, shortness of breath or vomiting.    Encounter Diagnoses  Name Primary?   Viral upper respiratory tract infection Yes   Laryngitis    5-day history of nasal congestion postnasal drip that has responded to Sudafed.  She now has developed laryngitis with a sore throat cough.  She denies headaches, nausea or vomiting, wheezing or difficulty breathing.  She does have a history of asthma.  She is also developed laryngitis over the last day or so.   Review of Systems  Constitutional: Negative.  Negative for chills, fever and malaise/fatigue.  HENT:  Positive for sore throat.   Eyes:  Negative for blurred vision, discharge and redness.  Respiratory:  Positive for cough. Negative for shortness of breath and wheezing.   Cardiovascular: Negative.   Gastrointestinal:  Negative for abdominal pain, diarrhea, nausea and vomiting.  Genitourinary: Negative.   Musculoskeletal: Negative.  Negative for joint pain and myalgias.  Skin:  Negative for rash.  Neurological:  Negative for tingling, loss of consciousness, weakness and headaches.  Endo/Heme/Allergies:  Negative for polydipsia.     Current Outpatient Medications:    albuterol (VENTOLIN HFA) 108 (90 Base) MCG/ACT inhaler, Inhale 2 puffs into the lungs every 6 (six) hours as needed for wheezing or shortness of breath., Disp: 8 g, Rfl: 0   glimepiride (AMARYL) 2 MG tablet, Take 1 tablet (2 mg total) by mouth daily before breakfast., Disp: 90 tablet,  Rfl: 1   Glucose Blood (BLOOD GLUCOSE TEST STRIPS) STRP, 1 each by In Vitro route daily., Disp: 100 each, Rfl: 3   Lancets MISC, 1 each by Does not apply route daily., Disp: 100 each, Rfl: 3   levonorgestrel (MIRENA) 20 MCG/DAY IUD, 1 each by Intrauterine route once., Disp: , Rfl:    metFORMIN (GLUCOPHAGE-XR) 750 MG 24 hr tablet, Take 1 tablet (750 mg total) by mouth daily with breakfast., Disp: 90 tablet, Rfl: 1   nystatin-triamcinolone ointment (MYCOLOG), Apply 1 Application topically 2 (two) times daily., Disp: 30 g, Rfl: 0   [START ON 03/26/2023] phentermine 15 MG capsule, Take 1 capsule (15 mg total) by mouth every morning., Disp: 30 capsule, Rfl: 0   phentermine 15 MG capsule, Take 1 capsule (15 mg total) by mouth every morning., Disp: 30 capsule, Rfl: 0   phentermine 15 MG capsule, Take 1 capsule (15 mg total) by mouth every morning., Disp: 30 capsule, Rfl: 0   rosuvastatin (CRESTOR) 5 MG tablet, Take 1 tablet (5 mg total) by mouth daily., Disp: 90 tablet, Rfl: 1   benzonatate (TESSALON) 200 MG capsule, Take 1 capsule (200 mg total) by mouth 2 (two) times daily as needed for cough., Disp: 20 capsule, Rfl: 0   Objective:     BP 131/86   Pulse 84   Wt 272 lb 9.6 oz (123.7 kg)   SpO2 95%   BMI 47.53 kg/m    Physical Exam Constitutional:      General: She is not in acute distress.    Appearance:  Normal appearance. She is not ill-appearing, toxic-appearing or diaphoretic.  HENT:     Head: Normocephalic and atraumatic.     Right Ear: Tympanic membrane, ear canal and external ear normal.     Left Ear: Tympanic membrane, ear canal and external ear normal.     Mouth/Throat:     Mouth: Mucous membranes are moist.     Pharynx: Oropharynx is clear. No oropharyngeal exudate or posterior oropharyngeal erythema.  Eyes:     General: No scleral icterus.       Right eye: No discharge.        Left eye: No discharge.     Extraocular Movements: Extraocular movements intact.      Conjunctiva/sclera: Conjunctivae normal.     Pupils: Pupils are equal, round, and reactive to light.  Cardiovascular:     Rate and Rhythm: Normal rate and regular rhythm.  Pulmonary:     Effort: Pulmonary effort is normal. No respiratory distress.     Breath sounds: Normal breath sounds. No wheezing, rhonchi or rales.  Abdominal:     General: Bowel sounds are normal.     Tenderness: There is no abdominal tenderness. There is no guarding.  Musculoskeletal:     Cervical back: Neck supple. No rigidity or tenderness.  Lymphadenopathy:     Cervical: No cervical adenopathy.  Skin:    General: Skin is warm and dry.  Neurological:     Mental Status: She is alert and oriented to person, place, and time.  Psychiatric:        Mood and Affect: Mood normal.        Behavior: Behavior normal.      No results found for any visits on 03/09/23.    The ASCVD Risk score (Arnett DK, et al., 2019) failed to calculate for the following reasons:   The 2019 ASCVD risk score is only valid for ages 55 to 40    Assessment & Plan:   Viral upper respiratory tract infection -     Benzonatate; Take 1 capsule (200 mg total) by mouth 2 (two) times daily as needed for cough.  Dispense: 20 capsule; Refill: 0  Laryngitis    Return in about 1 week (around 03/16/2023), or if symptoms worsen or fail to improve.  Drink plenty of fluids.  Use Tylenol and Advil for pain.  Soothing teas can be helpful.  COVID and strep testing were both negative.  On Monday out of work through Friday.  Mliss Sax, MD

## 2023-04-16 ENCOUNTER — Encounter: Payer: Self-pay | Admitting: Radiology

## 2023-04-16 ENCOUNTER — Ambulatory Visit (INDEPENDENT_AMBULATORY_CARE_PROVIDER_SITE_OTHER): Payer: PRIVATE HEALTH INSURANCE | Admitting: Radiology

## 2023-04-16 ENCOUNTER — Other Ambulatory Visit (HOSPITAL_COMMUNITY)
Admission: RE | Admit: 2023-04-16 | Discharge: 2023-04-16 | Disposition: A | Discharge status: Home / Self Care | Payer: PRIVATE HEALTH INSURANCE | Source: Ambulatory Visit | Attending: Radiology | Admitting: Radiology

## 2023-04-16 VITALS — BP 132/86 | Ht 63.5 in | Wt 277.0 lb

## 2023-04-16 DIAGNOSIS — Z01419 Encounter for gynecological examination (general) (routine) without abnormal findings: Secondary | ICD-10-CM | POA: Diagnosis not present

## 2023-04-16 DIAGNOSIS — E282 Polycystic ovarian syndrome: Secondary | ICD-10-CM | POA: Diagnosis not present

## 2023-04-16 DIAGNOSIS — Z975 Presence of (intrauterine) contraceptive device: Secondary | ICD-10-CM | POA: Diagnosis not present

## 2023-04-16 NOTE — Progress Notes (Signed)
Melissa Riggs 1995/08/19 [id number]   History:  27 y.o. G0 presents for annual exam. Mirena for cycle control. Hx of PCOS. Occasional spotting. Hx of negative EMB 2022, 2021. Type 2 DM, on metformin, insurance would no longer cover ozempic.  Gynecologic History No LMP recorded. (Menstrual status: IUD).   Contraception/Family planning: IUD Sexually active: yes Last Pap: 2019  Obstetric History OB History  Gravida Para Term Preterm AB Living  0 0 0 0 0 0  SAB IAB Ectopic Multiple Live Births  0 0 0 0 0    The following portions of the patient's history were reviewed and updated as appropriate: allergies, current medications, past family history, past medical history, past social history, past surgical history, and problem list.  Review of Systems  All other systems reviewed and are negative.   Past medical history, past surgical history, family history and social history were all reviewed and documented in the EPIC chart.  Exam:  Vitals:   04/16/23 1537  BP: 132/86  Weight: 277 lb (125.6 kg)  Height: 5' 3.5" (1.613 m)   Body mass index is 48.3 kg/m.  Physical Exam Vitals and nursing note reviewed. Exam conducted with a chaperone present.  Constitutional:      Appearance: Normal appearance. She is obese.  HENT:     Head: Normocephalic and atraumatic.  Neck:     Thyroid: No thyroid mass, thyromegaly or thyroid tenderness.  Cardiovascular:     Rate and Rhythm: Regular rhythm.     Heart sounds: Normal heart sounds.  Pulmonary:     Effort: Pulmonary effort is normal.     Breath sounds: Normal breath sounds.  Chest:  Breasts:    Breasts are symmetrical.     Right: Normal. No inverted nipple, mass, nipple discharge, skin change or tenderness.     Left: Normal. No inverted nipple, mass, nipple discharge, skin change or tenderness.  Abdominal:     General: Abdomen is flat. Bowel sounds are normal.     Palpations: Abdomen is soft.  Genitourinary:     General: Normal vulva.     Vagina: Normal. No vaginal discharge, bleeding or lesions.     Cervix: Normal. No discharge or lesion.     Uterus: Normal. Not enlarged and not tender.      Adnexa: Right adnexa normal and left adnexa normal.       Right: No mass, tenderness or fullness.         Left: No mass, tenderness or fullness.    Lymphadenopathy:     Upper Body:     Right upper body: No axillary adenopathy.     Left upper body: No axillary adenopathy.  Skin:    General: Skin is warm and dry.  Neurological:     Mental Status: She is alert and oriented to person, place, and time.  Psychiatric:        Mood and Affect: Mood normal.        Thought Content: Thought content normal.        Judgment: Judgment normal.      Raynelle Fanning, CMA present for exam  Assessment/Plan:   1. Well woman exam with routine gynecological exam - Cytology - PAP( )  2. IUD (intrauterine device) in place May leave mirena until 2028  3. PCOS (polycystic ovarian syndrome)     Discussed SBE, pap screening as directed/appropriate. Recommend of exercise weekly, including weight bearing exercise. Encouraged the use of seatbelts and sunscreen.  Arlie Solomons B WHNP-BC 4:00 PM 04/16/2023

## 2023-04-20 LAB — CYTOLOGY - PAP: Diagnosis: NEGATIVE

## 2023-04-30 ENCOUNTER — Ambulatory Visit: Payer: PRIVATE HEALTH INSURANCE | Admitting: Family Medicine

## 2023-06-21 ENCOUNTER — Telehealth: Payer: PRIVATE HEALTH INSURANCE

## 2023-06-21 DIAGNOSIS — J189 Pneumonia, unspecified organism: Secondary | ICD-10-CM | POA: Diagnosis not present

## 2023-06-21 MED ORDER — ALBUTEROL SULFATE HFA 108 (90 BASE) MCG/ACT IN AERS: 2.0000 | INHALATION_SPRAY | Freq: Four times a day (QID) | RESPIRATORY_TRACT | 0 refills | Status: AC | PRN

## 2023-06-21 MED ORDER — CEFPODOXIME PROXETIL 200 MG PO TABS: 200.0000 mg | ORAL_TABLET | Freq: Two times a day (BID) | ORAL | 0 refills | Status: AC

## 2023-06-21 MED ORDER — PREDNISONE 10 MG (21) PO TBPK: ORAL_TABLET | ORAL | 0 refills | Status: DC

## 2023-06-21 MED ORDER — AZITHROMYCIN 250 MG PO TABS: ORAL_TABLET | ORAL | 0 refills | Status: AC

## 2023-06-21 NOTE — Progress Notes (Signed)
 Virtual Visit Consent   Melissa Riggs, you are scheduled for a virtual visit with a [facility] provider today. Just as with appointments in the office, your consent must be obtained to participate. Your consent will be active for this visit and any virtual visit you may have with one of our providers in the next 365 days. If you have a MyChart account, a copy of this consent can be sent to you electronically.  As this is a virtual visit, video technology does not allow for your provider to perform a traditional examination. This may limit your provider's ability to fully assess your condition. If your provider identifies any concerns that need to be evaluated in person or the need to arrange testing (such as labs, EKG, etc.), we will make arrangements to do so. Although advances in technology are sophisticated, we cannot ensure that it will always work on either your end or our end. If the connection with a video visit is poor, the visit may have to be switched to a telephone visit. With either a video or telephone visit, we are not always able to ensure that we have a secure connection.  By engaging in this virtual visit, you consent to the provision of healthcare and authorize for your insurance to be billed (if applicable) for the services provided during this visit. Depending on your insurance coverage, you may receive a charge related to this service.  I need to obtain your verbal consent now. Are you willing to proceed with your visit today? [name] has provided verbal consent on 06/21/2023 for a virtual visit (video or telephone). Jon CHRISTELLA Belt, NP  Date: 06/21/2023 8:37 AM  Virtual Visit via Video Note   I, Jon CHRISTELLA Belt, connected with  Melissa Riggs  ([id number], 1996-04-16) on 06/21/23 at  8:30 AM EST by a video-enabled telemedicine application and verified that I am speaking with the correct person using two identifiers.  Location: Patient: Virtual  Visit Location Patient: Home Provider: Virtual Visit Location Provider: Home Office   I discussed the limitations of evaluation and management by telemedicine and the availability of in person appointments. The patient expressed understanding and agreed to proceed.    History of Present Illness: Melissa Riggs is a 28 y.o. who identifies as a female who was assigned female at birth, and is being seen today for productive cough. Had a cold about 2 weeks ago, got better in general from upper resp sx, but then started coughing/it settled into her chest and now she is worse again. Using her albuterol  inhaler which helps temporarily as well as using theraflu and mucinex. Just not getting better  HPI: HPI  Problems:  Patient Active Problem List   Diagnosis Date Noted   Laryngitis 03/09/2023   PCOS (polycystic ovarian syndrome) 01/28/2023   Diabetes mellitus (HCC) 08/30/2020   Vitamin D  deficiency 08/30/2020   Class 3 severe obesity with serious comorbidity and body mass index (BMI) of 45.0 to 49.9 in adult [facility]) 08/30/2020   Morbid obesity (HCC) 06/20/2020   Type 2 diabetes mellitus with obesity (HCC) 06/18/2020   Viral upper respiratory tract infection 12/23/2018   Exposure to COVID-19 virus 11/18/2018   Suspected COVID-19 virus infection 11/18/2018   Hypothyroidism 03/02/2018   Anovulation 03/02/2018    Allergies:  Allergies  Allergen Reactions   Betadine [Povidone Iodine] Itching    Rxn. After last surgery, prepped with betadine.   Amoxicillin Diarrhea, Nausea And Vomiting and Other (See Comments)  dehydration   Amoxicillin Diarrhea   Penicillins Diarrhea, Nausea And Vomiting and Other (See Comments)    Dehydration Has patient had a PCN reaction causing immediate rash, facial/tongue/throat swelling, SOB or lightheadedness with hypotension: No Has patient had a PCN reaction causing severe rash involving mucus membranes or skin necrosis: No Has patient had a PCN reaction  that required hospitalization: Yes Has patient had a PCN reaction occurring within the last 10 years: Yes If all of the above answers are NO, then may proceed with Cephalosporin use.   Medications:  Current Outpatient Medications:    azithromycin  (ZITHROMAX ) 250 MG tablet, Take 2 tablets on day 1, then 1 tablet daily on days 2 through 5, Disp: 6 tablet, Rfl: 0   cefpodoxime  (VANTIN ) 200 MG tablet, Take 1 tablet (200 mg total) by mouth 2 (two) times daily for 5 days., Disp: 10 tablet, Rfl: 0   predniSONE  (STERAPRED UNI-PAK 21 TAB) 10 MG (21) TBPK tablet, Take 6 tabs on day 1; take 5 tabs day 2; take 4 tabs day 3; take 3 tabs day 4; take 2 tabs day 5; take 1 tab day 6, Disp: 21 tablet, Rfl: 0   albuterol  (VENTOLIN  HFA) 108 (90 Base) MCG/ACT inhaler, Inhale 2 puffs into the lungs every 6 (six) hours as needed for wheezing or shortness of breath., Disp: 8 g, Rfl: 0   glimepiride  (AMARYL ) 2 MG tablet, Take 1 tablet (2 mg total) by mouth daily before breakfast., Disp: 90 tablet, Rfl: 1   Glucose Blood (BLOOD GLUCOSE TEST STRIPS) STRP, 1 each by In Vitro route daily., Disp: 100 each, Rfl: 3   Lancets MISC, 1 each by Does not apply route daily., Disp: 100 each, Rfl: 3   levonorgestrel  (MIRENA ) 20 MCG/DAY IUD, 1 each by Intrauterine route once. Mirena  inserted 08/2018, Disp: , Rfl:    metFORMIN  (GLUCOPHAGE -XR) 750 MG 24 hr tablet, Take 1 tablet (750 mg total) by mouth daily with breakfast., Disp: 90 tablet, Rfl: 1   phentermine  15 MG capsule, Take 1 capsule (15 mg total) by mouth every morning., Disp: 30 capsule, Rfl: 0   phentermine  15 MG capsule, Take 1 capsule (15 mg total) by mouth every morning., Disp: 30 capsule, Rfl: 0   rosuvastatin  (CRESTOR ) 5 MG tablet, Take 1 tablet (5 mg total) by mouth daily., Disp: 90 tablet, Rfl: 1  Observations/Objective: Patient is well-developed, well-nourished in no acute distress.  Resting comfortably  at home.  Head is normocephalic, atraumatic.  No labored  breathing.  Speech is clear and coherent with logical content.  Patient is alert and oriented at baseline.  Frequent coughing spasms  Assessment and Plan: 1. Community acquired pneumonia, unspecified laterality (Primary)  Rec mucinex continues. Pt declined offer of diff cough med. F/u if not improving with tx  Follow Up Instructions: I discussed the assessment and treatment plan with the patient. The patient was provided an opportunity to ask questions and all were answered. The patient agreed with the plan and demonstrated an understanding of the instructions.  A copy of instructions were sent to the patient via MyChart unless otherwise noted below.   The patient was advised to call back or seek an in-person evaluation if the symptoms worsen or if the condition fails to improve as anticipated.    Jon CHRISTELLA Belt, NP

## 2023-06-21 NOTE — Patient Instructions (Signed)
 Melissa Riggs, thank you for joining Jon CHRISTELLA Belt, NP for today's virtual visit.  While this provider is not your primary care provider (PCP), if your PCP is located in our provider database this encounter information will be shared with them immediately following your visit.   A [facility] MyChart account gives you access to today's visit and all your visits, tests, and labs performed at [facility]  click here if you don't have a [facility] MyChart account or go to [email or web address]  Consent: (Patient) Melissa Riggs provided verbal consent for this virtual visit at the beginning of the encounter.  Current Medications:  Current Outpatient Medications:    azithromycin  (ZITHROMAX ) 250 MG tablet, Take 2 tablets on day 1, then 1 tablet daily on days 2 through 5, Disp: 6 tablet, Rfl: 0   cefpodoxime  (VANTIN ) 200 MG tablet, Take 1 tablet (200 mg total) by mouth 2 (two) times daily for 5 days., Disp: 10 tablet, Rfl: 0   predniSONE  (STERAPRED UNI-PAK 21 TAB) 10 MG (21) TBPK tablet, Take 6 tabs on day 1; take 5 tabs day 2; take 4 tabs day 3; take 3 tabs day 4; take 2 tabs day 5; take 1 tab day 6, Disp: 21 tablet, Rfl: 0   albuterol  (VENTOLIN  HFA) 108 (90 Base) MCG/ACT inhaler, Inhale 2 puffs into the lungs every 6 (six) hours as needed for wheezing or shortness of breath., Disp: 8 g, Rfl: 0   glimepiride  (AMARYL ) 2 MG tablet, Take 1 tablet (2 mg total) by mouth daily before breakfast., Disp: 90 tablet, Rfl: 1   Glucose Blood (BLOOD GLUCOSE TEST STRIPS) STRP, 1 each by In Vitro route daily., Disp: 100 each, Rfl: 3   Lancets MISC, 1 each by Does not apply route daily., Disp: 100 each, Rfl: 3   levonorgestrel  (MIRENA ) 20 MCG/DAY IUD, 1 each by Intrauterine route once. Mirena  inserted 08/2018, Disp: , Rfl:    metFORMIN  (GLUCOPHAGE -XR) 750 MG 24 hr tablet, Take 1 tablet (750 mg total) by mouth daily with breakfast., Disp: 90 tablet, Rfl: 1   phentermine  15 MG  capsule, Take 1 capsule (15 mg total) by mouth every morning., Disp: 30 capsule, Rfl: 0   phentermine  15 MG capsule, Take 1 capsule (15 mg total) by mouth every morning., Disp: 30 capsule, Rfl: 0   rosuvastatin  (CRESTOR ) 5 MG tablet, Take 1 tablet (5 mg total) by mouth daily., Disp: 90 tablet, Rfl: 1   Medications ordered in this encounter:  Meds ordered this encounter  Medications   albuterol  (VENTOLIN  HFA) 108 (90 Base) MCG/ACT inhaler    Sig: Inhale 2 puffs into the lungs every 6 (six) hours as needed for wheezing or shortness of breath.    Dispense:  8 g    Refill:  0   azithromycin  (ZITHROMAX ) 250 MG tablet    Sig: Take 2 tablets on day 1, then 1 tablet daily on days 2 through 5    Dispense:  6 tablet    Refill:  0   cefpodoxime  (VANTIN ) 200 MG tablet    Sig: Take 1 tablet (200 mg total) by mouth 2 (two) times daily for 5 days.    Dispense:  10 tablet    Refill:  0   predniSONE  (STERAPRED UNI-PAK 21 TAB) 10 MG (21) TBPK tablet    Sig: Take 6 tabs on day 1; take 5 tabs day 2; take 4 tabs day 3; take 3 tabs day 4; take 2 tabs day 5; take 1  tab day 6    Dispense:  21 tablet    Refill:  0     *If you need refills on other medications prior to your next appointment, please contact your pharmacy*  Follow-Up: Call back or seek an in-person evaluation if the symptoms worsen or if the condition fails to improve as anticipated.  [facility] [phone]  Other Instructions Continue to use mucinex and drink lots of liquids. Continue using albuterol  to help breathing. If you are not substantially better in 2 days, please be seen in person. Be aware prednisone  may help your breathing but it will probably increase your blood sugar values while you are taking it.    If you have been instructed to have an in-person evaluation today at a local Urgent Care facility, please use the link below. It will take you to a list of all of our available [facility], including  address, phone number and hours of operation. Please do not delay care.  [facility]  If you or a family member do not have a primary care provider, use the link below to schedule a visit and establish care. When you choose a [facility] primary care physician or advanced practice provider, you gain a long-term partner in health. Find a Primary Care Provider  Learn more about [facility] in-office and virtual care options: [facility] - Get Care Now

## 2023-06-29 MED ORDER — FLUCONAZOLE 150 MG PO TABS: 150.0000 mg | ORAL_TABLET | ORAL | 0 refills | Status: DC

## 2023-06-29 NOTE — Telephone Encounter (Signed)
Fluconazole 150mg  po #2 3 days apart, if no improvement will need OV

## 2023-11-23 ENCOUNTER — Ambulatory Visit: Payer: PRIVATE HEALTH INSURANCE | Admitting: Family Medicine

## 2023-12-13 ENCOUNTER — Ambulatory Visit: Payer: PRIVATE HEALTH INSURANCE | Admitting: Family Medicine

## 2024-01-28 ENCOUNTER — Telehealth: Payer: PRIVATE HEALTH INSURANCE | Admitting: Physician Assistant

## 2024-01-28 DIAGNOSIS — Z20822 Contact with and (suspected) exposure to covid-19: Secondary | ICD-10-CM | POA: Diagnosis not present

## 2024-01-28 MED ORDER — NIRMATRELVIR/RITONAVIR (PAXLOVID)TABLET: 3.0000 | ORAL_TABLET | Freq: Two times a day (BID) | ORAL | 0 refills | Status: AC

## 2024-01-28 MED ORDER — FLUTICASONE PROPIONATE 50 MCG/ACT NA SUSP: 2.0000 | Freq: Every day | NASAL | 0 refills | Status: AC

## 2024-01-28 NOTE — Patient Instructions (Signed)
 Kaiyah L Balanzar-Lopez, thank you for joining [employer], PA-C for today's virtual visit.  While this provider is not your primary care provider (PCP), if your PCP is located in our provider database this encounter information will be shared with them immediately following your visit.   A [facility] MyChart account gives you access to today's visit and all your visits, tests, and labs performed at [facility]  click here if you don't have a [facility] MyChart account or go to [email or web address]  Consent: (Patient) [name] provided verbal consent for this virtual visit at the beginning of the encounter.  Current Medications:  Current Outpatient Medications:    albuterol  (VENTOLIN  HFA) 108 (90 Base) MCG/ACT inhaler, Inhale 2 puffs into the lungs every 6 (six) hours as needed for wheezing or shortness of breath., Disp: 8 g, Rfl: 0   fluconazole  (DIFLUCAN ) 150 MG tablet, Take 1 tablet (150 mg total) by mouth every 3 (three) days., Disp: 2 tablet, Rfl: 0   glimepiride  (AMARYL ) 2 MG tablet, Take 1 tablet (2 mg total) by mouth daily before breakfast., Disp: 90 tablet, Rfl: 1   Glucose Blood (BLOOD GLUCOSE TEST STRIPS) STRP, 1 each by In Vitro route daily., Disp: 100 each, Rfl: 3   Lancets MISC, 1 each by Does not apply route daily., Disp: 100 each, Rfl: 3   levonorgestrel  (MIRENA ) 20 MCG/DAY IUD, 1 each by Intrauterine route once. Mirena  inserted 08/2018, Disp: , Rfl:    metFORMIN  (GLUCOPHAGE -XR) 750 MG 24 hr tablet, Take 1 tablet (750 mg total) by mouth daily with breakfast., Disp: 90 tablet, Rfl: 1   phentermine  15 MG capsule, Take 1 capsule (15 mg total) by mouth every morning., Disp: 30 capsule, Rfl: 0   phentermine  15 MG capsule, Take 1 capsule (15 mg total) by mouth every morning., Disp: 30 capsule, Rfl: 0   predniSONE  (STERAPRED UNI-PAK 21 TAB) 10 MG (21) TBPK tablet, Take 6 tabs on day 1; take 5 tabs day 2; take 4 tabs day 3; take 3 tabs day 4;  take 2 tabs day 5; take 1 tab day 6, Disp: 21 tablet, Rfl: 0   rosuvastatin  (CRESTOR ) 5 MG tablet, Take 1 tablet (5 mg total) by mouth daily., Disp: 90 tablet, Rfl: 1   Medications ordered in this encounter:  No orders of the defined types were placed in this encounter.    *If you need refills on other medications prior to your next appointment, please contact your pharmacy*  Follow-Up: Call back or seek an in-person evaluation if the symptoms worsen or if the condition fails to improve as anticipated.  [facility] [phone]  Other Instructions Take paxlovid  as directed   Take fluticasone  as directed   Follow up with your regular doctor in 1 week for reassessment and seek care sooner if your symptoms worsen or fail to improve.   If you have been instructed to have an in-person evaluation today at a local Urgent Care facility, please use the link below. It will take you to a list of all of our available [facility], including address, phone number and hours of operation. Please do not delay care.  [facility]  If you or a family member do not have a primary care provider, use the link below to schedule a visit and establish care. When you choose a [facility] primary care physician or advanced practice provider, you gain a long-term partner in health. Find a Primary  Care Provider  Learn more about [facility]'s in-office and virtual care options: [facility] - Get Care Now

## 2024-01-28 NOTE — Progress Notes (Signed)
 Ms. Melissa Riggs, Melissa Riggs are scheduled for a virtual visit with your provider today.    Just as we do with appointments in the office, we must obtain your consent to participate.  Your consent will be active for this visit and any virtual visit you may have with one of our providers in the next 365 days.    If you have a MyChart account, I can also send a copy of this consent to you electronically.  All virtual visits are billed to your insurance company just like a traditional visit in the office.  As this is a virtual visit, video technology does not allow for your provider to perform a traditional examination.  This may limit your provider's ability to fully assess your condition.  If your provider identifies any concerns that need to be evaluated in person or the need to arrange testing such as labs, EKG, etc, we will make arrangements to do so.    Although advances in technology are sophisticated, we cannot ensure that it will always work on either your end or our end.  If the connection with a video visit is poor, we may have to switch to a telephone visit.  With either a video or telephone visit, we are not always able to ensure that we have a secure connection.   I need to obtain your verbal consent now.   Are you willing to proceed with your visit today?   Melissa Riggs has provided verbal consent on 01/28/2024 for a virtual visit (video or telephone).   Melissa GORMAN Snuffer, PA-C 01/28/2024  11:01 AM   Date:  01/28/2024   ID:  Rolin LITTIE [name], DOB 27-Aug-1995, MRN [id number]  Patient Location: Home Provider Location: Home Office   Participants: Patient and Provider for Visit and Wrap up  Method of visit: Video  Location of Patient: Home Location of Provider: Home Office Consent was obtain for visit over the video. Services rendered by provider: Visit was performed via video  A video enabled telemedicine application was used and I verified that I am speaking with the  correct person using two identifiers.  PCP:  Ozell Heron HERO, MD   Chief Complaint:  covid exposure  History of Present Illness:    Melissa Riggs is a 28 y.o. female with history as stated below. Presents video telehealth for an acute care visit  Pt states that her husband recently tested positive for COVID. She states that since yesterday she had a congestion, sore throat, headache. Denies fevers. She has not taken a covid test.   Past Medical, Surgical, Social History, Allergies, and Medications have been Reviewed.  Past Medical History:  Diagnosis Date   Amenorrhea    Asthma    Asthma    when younger, not current problem   Cancer (HCC)    Complication of anesthesia    slow to awaken after tonsils and adenoid surgery yrs ago   Diabetes mellitus without complication (HCC)    Dysmenorrhea    Female infertility    Hormone disorder    PCOS (polycystic ovarian syndrome)    Prediabetes    Vision abnormalities    wears glasses    No outpatient medications have been marked as taking for the 01/28/24 encounter (Appointment) with [facility] PROVIDER.     Allergies:   Betadine [povidone iodine], Amoxicillin, Amoxicillin, and Penicillins   ROS See HPI for history of present illness.  Physical Exam Constitutional:      Appearance: Normal appearance.  Pulmonary:  Effort: Pulmonary effort is normal.  Neurological:     Mental Status: She is alert.              MDM: Pt with exposure to covid with uri sxs. Suspected covid. Discussed risks/benefit of antivirals. Pt would like paxlovid . Rx for fluticasone  sent as well.    Tests Ordered: No orders of the defined types were placed in this encounter.   Medication Changes: No orders of the defined types were placed in this encounter.    Disposition:  Follow up  Signed, Dale Ribeiro GORMAN Snuffer, PA-C  01/28/2024 11:01 AM

## 2024-03-10 HISTORY — PX: GASTRECTOMY, SLEEVE, ROBOT-ASSISTED: SHX7230

## 2024-06-02 ENCOUNTER — Ambulatory Visit: Payer: PRIVATE HEALTH INSURANCE | Admitting: Family Medicine

## 2024-06-02 VITALS — BP 102/80 | HR 67 | Temp 98.3°F | Ht 63.5 in | Wt 235.2 lb

## 2024-06-02 DIAGNOSIS — E1169 Type 2 diabetes mellitus with other specified complication: Secondary | ICD-10-CM

## 2024-06-02 DIAGNOSIS — E66813 Obesity, class 3: Secondary | ICD-10-CM

## 2024-06-02 DIAGNOSIS — E039 Hypothyroidism, unspecified: Secondary | ICD-10-CM

## 2024-06-02 DIAGNOSIS — Z23 Encounter for immunization: Secondary | ICD-10-CM | POA: Diagnosis not present

## 2024-06-02 DIAGNOSIS — Z903 Acquired absence of stomach [part of]: Secondary | ICD-10-CM | POA: Diagnosis not present

## 2024-06-02 DIAGNOSIS — E559 Vitamin D deficiency, unspecified: Secondary | ICD-10-CM

## 2024-06-02 DIAGNOSIS — Z6841 Body Mass Index (BMI) 40.0 and over, adult: Secondary | ICD-10-CM | POA: Diagnosis not present

## 2024-06-02 DIAGNOSIS — E785 Hyperlipidemia, unspecified: Secondary | ICD-10-CM

## 2024-06-02 LAB — POCT GLYCOSYLATED HEMOGLOBIN (HGB A1C): Hemoglobin A1C: 5.7 % — AB (ref 4.0–5.6)

## 2024-06-02 NOTE — Progress Notes (Signed)
 "  Established Patient Office Visit  Subjective   Patient ID: Melissa Riggs, female    DOB: 08-27-1995  Age: 28 y.o. MRN: [id number]  Chief Complaint  Patient presents with   Medical Management of Chronic Issues    HPI Discussed the use of AI scribe software for clinical note transcription with the patient, who gave verbal consent to proceed.  History of Present Illness   Melissa Riggs is a 28 year old female who presents for a follow-up after VSG surgery to monitor vitamin deficiencies and blood sugar levels.  She underwent VSG on October 3rd and has had significant weight loss, including 42 pounds since November 2024, from a highest recorded weight of 292 pounds in 2021.  Before surgery her blood sugar was 46. She is not taking any medications post-surgery and monitors blood sugars at home. She had a single elevation to 220 after dietary indiscretion on Christmas that returned to 104 the next morning. She is not on diabetes medication and previously used [place], which is no longer covered by insurance.  Her current diet emphasizes 80 to 100 grams of protein daily with avoidance of carbohydrates and sugar, using artificial sweeteners. She lives alone and has adjusted to a zero-sugar, protein-focused intake.  She has high cholesterol with an LDL over 200 last year and is awaiting updated labs. She is not on cholesterol medication.  She goes to the gym once or twice weekly and focuses on muscle-building exercises to prevent muscle wasting after surgery, and she plans to increase this.  She has been inconsistent with her [employer] multivitamin after surgery due to the number and size of the pills and difficulty taking them post-op.       Current Outpatient Medications  Medication Instructions   albuterol  (VENTOLIN  HFA) 108 (90 Base) MCG/ACT inhaler 2 puffs, Inhalation, Every 6 hours PRN   fluticasone  (FLONASE ) 50 MCG/ACT nasal spray 2 sprays, Each Nare, Daily    Glucose Blood (BLOOD GLUCOSE TEST STRIPS) STRP 1 each, In Vitro, Daily   Lancets MISC 1 each, Does not apply, Daily   levonorgestrel  (MIRENA ) 20 MCG/DAY IUD 1 each,  Once    Patient Active Problem List   Diagnosis Date Noted   Laryngitis 03/09/2023   PCOS (polycystic ovarian syndrome) 01/28/2023   Diabetes mellitus (HCC) 08/30/2020   Vitamin D  deficiency 08/30/2020   Class 3 severe obesity with serious comorbidity and body mass index (BMI) of 45.0 to 49.9 in adult (HCC) 08/30/2020   Morbid obesity (HCC) 06/20/2020   Type 2 diabetes mellitus with obesity 06/18/2020   Viral upper respiratory tract infection 12/23/2018   Exposure to COVID-19 virus 11/18/2018   Suspected COVID-19 virus infection 11/18/2018   Hypothyroidism 03/02/2018   Anovulation 03/02/2018     Review of Systems  All other systems reviewed and are negative.     Objective:     BP 102/80   Pulse 67   Temp 98.3 F (36.8 C) (Oral)   Ht 5' 3.5 (1.613 m)   Wt 235 lb 3.2 oz (106.7 kg)   SpO2 98%   BMI 41.01 kg/m    Physical Exam Vitals reviewed.  Constitutional:      Appearance: Normal appearance. She is well-groomed. She is morbidly obese.  Cardiovascular:     Rate and Rhythm: Normal rate and regular rhythm.     Pulses: Normal pulses.     Heart sounds: S1 normal and S2 normal.  Pulmonary:     Effort: Pulmonary effort is  normal.     Breath sounds: Normal breath sounds and air entry.  Abdominal:     General: Bowel sounds are normal.  Musculoskeletal:     Right lower leg: No edema.     Left lower leg: No edema.  Neurological:     Mental Status: She is alert and oriented to person, place, and time. Mental status is at baseline.     Gait: Gait is intact.  Psychiatric:        Mood and Affect: Mood and affect normal.        Speech: Speech normal.        Behavior: Behavior normal.        Judgment: Judgment normal.    Diabetic Foot Exam - Simple   Simple Foot Form Diabetic Foot exam was performed  with the following findings: Yes 06/02/2024  8:36 AM  Visual Inspection No deformities, no ulcerations, no other skin breakdown bilaterally: Yes Sensation Testing Intact to touch and monofilament testing bilaterally: Yes Pulse Check Posterior Tibialis and Dorsalis pulse intact bilaterally: Yes Comments      Results for orders placed or performed in visit on 06/02/24  POC HgB A1c  Result Value Ref Range   Hemoglobin A1C 5.7 (A) 4.0 - 5.6 %   HbA1c POC (<> result, manual entry)     HbA1c, POC (prediabetic range)     HbA1c, POC (controlled diabetic range)        The ASCVD Risk score (Arnett DK, et al., 2019) failed to calculate for the following reasons:   The 2019 ASCVD risk score is only valid for ages 9 to 50   According to ACC/AHA guidelines, patients with an LDL-C level greater than 190 mg/dL (5.08 mmol/L) should be considered for high-intensity statin therapy. This patient's most recent LDL-C level is 203 mg/dL.    Assessment & Plan:  Type 2 diabetes mellitus with other specified complication, without long-term current use of insulin  (HCC) -     POCT glycosylated hemoglobin (Hb A1C) -     Collection capillary blood specimen -     Microalbumin / creatinine urine ratio; Future -     Comprehensive metabolic panel with GFR; Future  Hypothyroidism, unspecified type -     TSH; Future  Vitamin D  deficiency -     VITAMIN D  25 Hydroxy (Vit-D Deficiency, Fractures); Future  Hyperlipidemia associated with type 2 diabetes mellitus (HCC) -     Lipid panel; Future  S/P gastric sleeve procedure -     Vitamin B12; Future  Class 3 severe obesity with serious comorbidity and body mass index (BMI) of 45.0 to 49.9 in adult, unspecified obesity type [facility]) Assessment & Plan: S/p sleeve gastrectomy 03/10/2024, had it in [place], will order all repeat labs today.   Immunization due -     Flu vaccine trivalent PF, 6mos and older(Flulaval,Afluria,Fluarix,Fluzone)   Assessment and Plan     Type 2 diabetes mellitus Significant improvement in glycemic control post-sleeve gastrectomy. Recent A1c is 5.7, down from previous levels of 11. Fasting blood sugar was 104 this morning, with a previous reading of 220 last night. No current medication for diabetes. Prefers to avoid medication if possible. - Ordered A1c test - Continue monitoring blood sugar levels at home - Encouraged dietary modifications and exercise to maintain glycemic control  Class 3 obesity, status post sleeve gastrectomy Status post sleeve gastrectomy on October 3rd, 2025. Significant weight loss of 42 pounds since November 2024. Current weight management includes dietary changes and  exercise. Prefers to avoid additional medications unless necessary. - Continue dietary modifications focusing on protein intake and low carbohydrate consumption - Encouraged regular exercise, focusing on muscle building to prevent muscle wasting - Scheduled follow-up every three months for the first year post-surgery  Hyperlipidemia Previous LDL levels over 200. Current status to be evaluated with upcoming lipid panel. Previous recommendation for cholesterol medication due to high LDL levels. Current cardiac risk is low, but prevention of future cardiovascular events is a consideration. - Ordered lipid panel - Will evaluate LDL levels and determine need for cholesterol medication based on results  Hypothyroidism Requires monitoring of thyroid  function. - Ordered thyroid  function tests  Vitamin D  deficiency - Ordered vitamin D  level test     Return in about 3 months (around 08/31/2024).    Heron CHRISTELLA Sharper, MD "

## 2024-06-02 NOTE — Assessment & Plan Note (Signed)
 S/p sleeve gastrectomy 03/10/2024, had it in [place], will order all repeat labs today.

## 2024-06-04 LAB — COMPREHENSIVE METABOLIC PANEL WITH GFR
ALT: 26 IU/L (ref 0–32)
AST: 19 IU/L (ref 0–40)
Albumin: 4.6 g/dL (ref 4.0–5.0)
Alkaline Phosphatase: 62 IU/L (ref 41–116)
BUN/Creatinine Ratio: 18 (ref 9–23)
BUN: 10 mg/dL (ref 6–20)
Bilirubin Total: 0.5 mg/dL (ref 0.0–1.2)
CO2: 24 mmol/L (ref 20–29)
Calcium: 9.5 mg/dL (ref 8.7–10.2)
Chloride: 105 mmol/L (ref 96–106)
Creatinine, Ser: 0.57 mg/dL (ref 0.57–1.00)
Globulin, Total: 2.1 g/dL (ref 1.5–4.5)
Glucose: 87 mg/dL (ref 70–99)
Potassium: 4.3 mmol/L (ref 3.5–5.2)
Sodium: 140 mmol/L (ref 134–144)
Total Protein: 6.7 g/dL (ref 6.0–8.5)
eGFR: 125 mL/min/1.73

## 2024-06-04 LAB — MICROALBUMIN / CREATININE URINE RATIO
Creatinine, Urine: 218.9 mg/dL
Microalb/Creat Ratio: 19 mg/g{creat} (ref 0–29)
Microalbumin, Urine: 40.7 ug/mL

## 2024-06-04 LAB — LIPID PANEL
Chol/HDL Ratio: 4.4 ratio (ref 0.0–4.4)
Cholesterol, Total: 191 mg/dL (ref 100–199)
HDL: 43 mg/dL
LDL Chol Calc (NIH): 125 mg/dL — ABNORMAL HIGH (ref 0–99)
Triglycerides: 128 mg/dL (ref 0–149)
VLDL Cholesterol Cal: 23 mg/dL (ref 5–40)

## 2024-06-04 LAB — TSH: TSH: 0.777 u[IU]/mL (ref 0.450–4.500)

## 2024-06-04 LAB — VITAMIN D 25 HYDROXY (VIT D DEFICIENCY, FRACTURES): Vit D, 25-Hydroxy: 40.3 ng/mL (ref 30.0–100.0)

## 2024-06-04 LAB — VITAMIN B12: Vitamin B-12: 655 pg/mL (ref 232–1245)

## 2024-06-05 ENCOUNTER — Ambulatory Visit: Payer: Self-pay | Admitting: Family Medicine

## 2024-06-06 ENCOUNTER — Ambulatory Visit: Payer: PRIVATE HEALTH INSURANCE | Admitting: Radiology

## 2024-06-06 ENCOUNTER — Encounter: Payer: Self-pay | Admitting: Radiology

## 2024-06-06 VITALS — BP 110/78 | HR 65 | Wt 234.0 lb

## 2024-06-06 DIAGNOSIS — N939 Abnormal uterine and vaginal bleeding, unspecified: Secondary | ICD-10-CM | POA: Diagnosis not present

## 2024-06-06 LAB — PREGNANCY, URINE: Preg Test, Ur: NEGATIVE

## 2024-06-06 NOTE — Progress Notes (Signed)
 Melissa Riggs

## 2024-06-06 NOTE — Progress Notes (Signed)
" ° °  Melissa Riggs [birth year] [id number]   History:  28 y.o. Complains of irregular vaginal spotting with Mirena , since insertion 08/2018 (normally light brown). Noticed bright red spotting since Gastric Sleeve Surgery 03/2024.  Last spotting episode was 4-5 weeks ago x's 1 day. Plans to try for pregnancy after 1 year post op. HX of benign EMB x 2, 2022, 2021. Has lost enough weight to come off diabetes and cholesterol meds.  Gynecologic History Patient's last menstrual period was 05/03/2024 (approximate).   Contraception/Family planning: IUD   Obstetric History OB History  Gravida Para Term Preterm AB Living  0 0 0 0 0 0  SAB IAB Ectopic Multiple Live Births  0 0 0 0 0      The following portions of the patient's history were reviewed and updated as appropriate: allergies, current medications, past family history, past medical history, past social history, past surgical history, and problem list.  ROS  Past medical history, past surgical history, family history and social history were all reviewed and documented in the EPIC chart.  Exam:  Vitals:   06/06/24 1505  BP: 110/78  Pulse: 65  SpO2: 98%  Weight: 234 lb (106.1 kg)   Body mass index is 40.8 kg/m.  Physical Exam Exam conducted with a chaperone present.  Constitutional:      Appearance: Normal appearance. She is obese.  Pulmonary:     Effort: Pulmonary effort is normal.  Genitourinary:    General: Normal vulva.     Vagina: Normal.     Cervix: Normal.     Uterus: Normal.      Comments: Strings seen at os Neurological:     Mental Status: She is alert.  Psychiatric:        Mood and Affect: Mood normal.        Thought Content: Thought content normal.        Judgment: Judgment normal.      Darice Hoit, CMA present for exam  Assessment/Plan:   1. Abnormal vaginal bleeding (Primary) - Pregnancy, urine negative  Reassured bleeding can be normal after drastic weight loss. Will continue to monitor.  Will plan to remove IUD 4 months before she wants to start trying to allow cycles to regulate. Will use a barrier method for those 4 months.  GINETTE COZIER B WHNP-BC 3:23 PM 06/06/2024 "

## 2024-07-12 ENCOUNTER — Ambulatory Visit: Payer: PRIVATE HEALTH INSURANCE | Admitting: Radiology

## 2024-09-01 ENCOUNTER — Ambulatory Visit: Payer: PRIVATE HEALTH INSURANCE | Admitting: Family Medicine
